# Patient Record
Sex: Male | Born: 1945
Health system: Southern US, Community
[De-identification: ages and names within clinical notes are randomized; demographics above are authoritative.]

## PROBLEM LIST (undated history)

## (undated) DIAGNOSIS — I10 Essential (primary) hypertension: Secondary | ICD-10-CM

## (undated) DIAGNOSIS — E7800 Pure hypercholesterolemia, unspecified: Secondary | ICD-10-CM

## (undated) DIAGNOSIS — R39198 Other difficulties with micturition: Secondary | ICD-10-CM

## (undated) DIAGNOSIS — M545 Low back pain, unspecified: Secondary | ICD-10-CM

## (undated) DIAGNOSIS — I350 Nonrheumatic aortic (valve) stenosis: Secondary | ICD-10-CM

## (undated) DIAGNOSIS — G8929 Other chronic pain: Secondary | ICD-10-CM

## (undated) DIAGNOSIS — Z952 Presence of prosthetic heart valve: Secondary | ICD-10-CM

## (undated) DIAGNOSIS — I639 Cerebral infarction, unspecified: Secondary | ICD-10-CM

## (undated) DIAGNOSIS — G4733 Obstructive sleep apnea (adult) (pediatric): Secondary | ICD-10-CM

## (undated) DIAGNOSIS — R197 Diarrhea, unspecified: Secondary | ICD-10-CM

## (undated) DIAGNOSIS — C4491 Basal cell carcinoma of skin, unspecified: Secondary | ICD-10-CM

## (undated) DIAGNOSIS — K6289 Other specified diseases of anus and rectum: Secondary | ICD-10-CM

## (undated) DIAGNOSIS — M1990 Unspecified osteoarthritis, unspecified site: Secondary | ICD-10-CM

## (undated) DIAGNOSIS — Z9989 Dependence on other enabling machines and devices: Secondary | ICD-10-CM

## (undated) DIAGNOSIS — R011 Cardiac murmur, unspecified: Secondary | ICD-10-CM

## (undated) DIAGNOSIS — K625 Hemorrhage of anus and rectum: Secondary | ICD-10-CM

## (undated) HISTORY — PX: EXCISIONAL HEMORRHOIDECTOMY: SHX1541

## (undated) HISTORY — DX: Hemorrhage of anus and rectum: K62.5

## (undated) HISTORY — PX: WISDOM TOOTH EXTRACTION: SHX21

## (undated) HISTORY — DX: Essential (primary) hypertension: I10

## (undated) HISTORY — PX: COLONOSCOPY: SHX174

## (undated) HISTORY — DX: Diarrhea, unspecified: R19.7

## (undated) HISTORY — DX: Other difficulties with micturition: R39.198

## (undated) HISTORY — DX: Other specified diseases of anus and rectum: K62.89

## (undated) HISTORY — PX: LUMBAR DISC SURGERY: SHX700

## (undated) HISTORY — DX: Nonrheumatic aortic (valve) stenosis: I35.0

## (undated) HISTORY — PX: BACK SURGERY: SHX140

---
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
## 2001-07-30 ENCOUNTER — Encounter: Admission: RE | Admit: 2001-07-30 | Discharge: 2001-07-30 | Payer: Self-pay | Admitting: Family Medicine

## 2001-07-30 ENCOUNTER — Encounter: Payer: Self-pay | Admitting: Family Medicine

## 2001-08-03 ENCOUNTER — Encounter (INDEPENDENT_AMBULATORY_CARE_PROVIDER_SITE_OTHER): Payer: Self-pay | Admitting: Specialist

## 2001-08-03 ENCOUNTER — Encounter: Payer: Self-pay | Admitting: Gastroenterology

## 2001-08-03 ENCOUNTER — Inpatient Hospital Stay (HOSPITAL_COMMUNITY): Admission: EM | Admit: 2001-08-03 | Discharge: 2001-08-11 | Payer: Self-pay | Admitting: Emergency Medicine

## 2001-08-03 ENCOUNTER — Ambulatory Visit (HOSPITAL_COMMUNITY): Admission: RE | Admit: 2001-08-03 | Discharge: 2001-08-03 | Payer: Self-pay | Admitting: Gastroenterology

## 2001-08-07 ENCOUNTER — Encounter: Payer: Self-pay | Admitting: Gastroenterology

## 2003-05-07 HISTORY — PX: LAPAROSCOPIC CHOLECYSTECTOMY: SUR755

## 2004-05-06 HISTORY — PX: MOHS SURGERY: SUR867

## 2004-08-30 ENCOUNTER — Encounter (INDEPENDENT_AMBULATORY_CARE_PROVIDER_SITE_OTHER): Payer: Self-pay | Admitting: Specialist

## 2004-08-30 ENCOUNTER — Ambulatory Visit (HOSPITAL_COMMUNITY): Admission: RE | Admit: 2004-08-30 | Discharge: 2004-08-30 | Payer: Self-pay | Admitting: Gastroenterology

## 2007-05-07 HISTORY — PX: ANTERIOR CERVICAL DECOMP/DISCECTOMY FUSION: SHX1161

## 2007-08-05 ENCOUNTER — Ambulatory Visit (HOSPITAL_COMMUNITY): Admission: RE | Admit: 2007-08-05 | Discharge: 2007-08-06 | Payer: Self-pay | Admitting: Neurological Surgery

## 2007-09-14 ENCOUNTER — Encounter: Admission: RE | Admit: 2007-09-14 | Discharge: 2007-09-14 | Payer: Self-pay | Admitting: Neurological Surgery

## 2007-11-30 ENCOUNTER — Encounter: Admission: RE | Admit: 2007-11-30 | Discharge: 2007-11-30 | Payer: Self-pay | Admitting: Neurological Surgery

## 2008-09-05 ENCOUNTER — Encounter: Admission: RE | Admit: 2008-09-05 | Discharge: 2008-09-05 | Payer: Self-pay | Admitting: Neurological Surgery

## 2009-05-23 ENCOUNTER — Encounter: Admission: RE | Admit: 2009-05-23 | Discharge: 2009-05-23 | Payer: Self-pay | Admitting: General Surgery

## 2009-05-26 ENCOUNTER — Ambulatory Visit (HOSPITAL_BASED_OUTPATIENT_CLINIC_OR_DEPARTMENT_OTHER): Admission: RE | Admit: 2009-05-26 | Discharge: 2009-05-26 | Payer: Self-pay | Admitting: General Surgery

## 2010-05-28 ENCOUNTER — Encounter: Payer: Self-pay | Admitting: Neurological Surgery

## 2010-07-22 LAB — BASIC METABOLIC PANEL
BUN: 10 mg/dL (ref 6–23)
Creatinine, Ser: 1.02 mg/dL (ref 0.4–1.5)
GFR calc non Af Amer: >60 mL/min (ref >60)
Glucose, Bld: 113 mg/dL — ABNORMAL HIGH (ref 70–99)
Potassium: 4.3 mEq/L (ref 3.5–5.1)

## 2010-07-22 LAB — DIFFERENTIAL
Basophils Absolute: 0 10*3/uL (ref 0.0–0.1)
Eosinophils Absolute: 0.1 10*3/uL (ref 0.0–0.7)
Eosinophils Relative: 1 % (ref 0–5)
Lymphocytes Relative: 36 % (ref 12–46)

## 2010-07-22 LAB — CBC
HCT: 45.9 % (ref 39.0–52.0)
Platelets: 210 10*3/uL (ref 150–400)
RDW: 13.3 % (ref 11.5–15.5)

## 2010-07-22 LAB — POCT HEMOGLOBIN-HEMACUE: Hemoglobin: 15.8 g/dL (ref 13.0–17.0)

## 2010-09-18 NOTE — Op Note (Signed)
NAMEGIULIO, [name]:  [id number]   MEDICAL RECORD NO.:  [id number]          PATIENT TYPE:  OIB   LOCATION:  3599                         FACILITY:  [facility]   PHYSICIAN:  Tia Alert, MD     DATE OF BIRTH:  09-11-1945   DATE OF PROCEDURE:  08/05/2007  DATE OF DISCHARGE:                               OPERATIVE REPORT   PREOPERATIVE DIAGNOSIS:  Cervical spondylosis with cervical spinal  stenosis C4-C5 and C5-C6.   POSTOPERATIVE DIAGNOSIS:  Cervical spondylosis with cervical spinal  stenosis C4-C5 and C5-C6.   PROCEDURE:  1. Decompressive anterior cervical discectomy at C4-C5 and C5-C6.  2. Anterior cervical arthrodesis C4-C5 and C5-C6 utilizing an 8 mm      corticocancellous allograft at C4-C5 and a 7 mm corticocancellous      allograft at C5-C6.  3. Anterior cervical plating C4 to C6 utilizing a 42 mm Atlantis      Venture plate.   SURGEON:  Tia Alert, M.D.   ASSISTANT:  Donalee Citrin, M.D.   ANESTHESIA:  General endotracheal anesthesia.   COMPLICATIONS:  None apparent.   INDICATIONS FOR PROCEDURE:  [name] is a 65 year old gentleman who is  referred with significant neck pain with arm pain with numbness in his  hands.  He had an MRI which showed significant spondylosis, cervical  spinal stenosis at C4-C5 and C5-C6 with cord compression at each level,  worse at C4-C5. I recommended a decompressive anterior cervical  discectomy, fusion, and plating at C4-C5 and C5-C6.  He understood the  risks, benefits, and expected outcome and wished to proceed.   DESCRIPTION OF PROCEDURE:  The patient was taken to the operating room.  After the induction of adequate generalized endotracheal anesthesia, he  was placed in a supine position on the operating room table and his  right anterior cervical region was prepped with DuraPrep and draped in  the usual sterile fashion.  5 mL local anesthesia was injected and a  small transverse incision was made to the right  of midline and carried  down to the platysma which was elevated, opened, and undermined with  Metzenbaum scissors.  I then dissected in a plane medial to the  sternocleidomastoid muscle and internal carotid artery and lateral to  the trachea and esophagus to expose C4-C5 and C5-C6.  Intraoperative x-  Attridge confirmed my level and then I took down the longus colli muscles and  placed the Shadowline retractors under this to expose C4-C5 and C5-C6.  The annulus was incised and the initial discectomy was done with  pituitary rongeurs and curved curettes.  The high speed drill was used  to drill the endplates and widen the disc space in a rectangular fashion  drilling down to the level of the posterior longitudinal ligament.  I  drilled to a height of 8 mm at C4-C5 which needed more central  decompression and 7 mm at C5-C6.  I then opened the posterior  longitudinal ligament at C4-C5 and removed it while undercutting the  bodies of C4 and C5 with the 2  and 1 mm Kerrison punch.  We angled the  scope up and down to undercut the C4-C5 vertebral bodies.  Bilateral  foraminotomies were performed.  We then palpated with a nerve hook in a  circumferential fashion to assure adequate decompression.  We could see  the cord pulsatile through the dura.  We then lined this with Gelfoam  and did the same exact decompression at C5-C6, opening the posterior  longitudinal ligament and removing it while undercutting the bodies of  C5 and C6, angling the scope up and down. Bilateral foraminotomies were  performed, the nerve roots were identified.  We dissected to the pedicle  wall, marched along the pedicle to decompress the nerve root.  We then  palpated in a circumferential fashion with the nerve hook to assure  adequate decompression.  We then measured our interspaces to be 8 mm at  C4-C5 and 7 mm at C5-C6 and got corticocancellous allograft and tapped  these into position at C4-C5 and C5-C6.  We then used a 42  mm Atlantis  Venture plate and placed two 13 mm variable angle screws in the bodies  of C4, C5, and C6 and these were locked in the plate by locking the  mechanism within the plate.  I then spent considerable time drying the  surgical bed.  I irrigated with saline solution containing bacitracin,  dried all bleeding points with bipolar cautery, and once meticulous  hemostasis was achieved, closed the platysma with 3-0 Vicryl, closed the  subcuticular tissue with 3-0 Vicryl, and closed the skin with Benzoin  and Steri-Strips.  The drapes were removed.  A sterile dressing was  applied.  The patient was awakened from general anesthesia and  transferred to the recovery room in stable condition.  At the end of the  procedure, all sponge, needle and instrument counts were correct.      Tia Alert, MD  Electronically Signed     DSJ/MEDQ  D:  08/05/2007  T:  08/05/2007  Job:  [id number]

## 2010-09-21 NOTE — Op Note (Signed)
[facility]  Patient:    [name], [name] Visit Number: [id number] MRN: [id number]          Service Type: END Location: ENDO Attending Physician:  Louie Bun Dictated by:   Zigmund Daniel, M.D. Proc. Date: 08/10/01 Admit Date:  08/03/2001 Discharge Date: 08/03/2001   CC:         Verlin Grills, M.D.  Dr. Madilyn Fireman   Operative Report  PREOPERATIVE DIAGNOSIS:  Acute and chronic cholecystitis and cholelithiasis.  POSTOPERATIVE DIAGNOSIS:  Acute and chronic cholecystitis and cholelithiasis.  OPERATION PERFORMED:  Laparoscopic cholecystectomy.  SURGEON:  Zigmund Daniel, M.D.  ASSISTANT:  Anselm Pancoast. Zachery Dakins, M.D.  ANESTHESIA:  DESCRIPTION OF PROCEDURE:  After the patient was monitored and anesthetized and had routine preparation and draping of the abdomen, I anesthetized the the area with long-acting local anesthetic just below the umbilicus.  I made a transverse incision, incising fascia longitudinally, placed a 0 Vicryl pursestring suture and secured a Hasson cannula.  After inflating the abdomen with CO2, I examined it and saw no abnormalities except some adhesions to the undersurface of the liver and gallbladder and impression that there was a bit of remaining edema in the retroperitoneum.  After placing three additional ports through anesthetized incisions under direct vision, I placed the patient head up, foot down and tilted to the left.  I then noted that the gallbladder was distended.  It appeared to be thick-walled and slightly inflamed, consistent with acute cholecystitis.  I took down a couple of adhesions so I could see the fundus well and then decompressed the gallbladder with suction aspirator and it contained clear bile which was consistent with acute cholecystitis.  I took down further adhesions and was able to grasp the fundus of the gallbladder and pull it laterally, then dissect it through the edematous and  slightly scarred hepatoduodenal ligament until I clearly identified the infundibulum of the gallbladder with the cystic duct emerging. I clipped the cystic duct with three clips distally and one toward the gallbladder and then cut.  I then identified the cystic artery and similarly clipped and divided it.  I then dissected the gallbladder from the liver using cautery.  I accidentally made a hole in it and one large stone came out and I retrieved that with a stone scoop.  After detaching the gallbladder from the liver, I put it in a plastic pouch and then copiously irrigated the right upper quadrant and cauterized the gallbladder fossa until I was sure hemostasis was good.  I then removed the gallbladder from the body through the umbilical incision inside the pouch and tied the pursestring suture.  I again copiously irrigated the right upper quadrant and removed irrigant.  Hemostasis was good and the clips appeared to be secure.  I allowed the CO2 to escape after removing the lateral ports under direct vision and then I removed the epigastric port and closed all skin incisions with intracuticular 4-0 Vicryl and Steri-Strips.  The patient tolerated the procedure well. Dictated by:   Zigmund Daniel, M.D. Attending Physician:  Louie Bun DD:  08/10/01 TD:  08/10/01 Job: [id number]

## 2010-09-21 NOTE — Op Note (Signed)
NAMEFAISAL, [name]                   ACCOUNT NO.:  [id number]   MEDICAL RECORD NO.:  [id number]          PATIENT TYPE:  AMB   LOCATION:  ENDO                         FACILITY:  [facility]   PHYSICIAN:  John C. Madilyn Fireman, M.D.    DATE OF BIRTH:  November 15, 1945   DATE OF PROCEDURE:  08/30/2004  DATE OF DISCHARGE:                                 OPERATIVE REPORT   PROCEDURE:  Colonoscopy.   INDICATION FOR PROCEDURE:  Family history of colon cancer in a first degree  relative, as well as chronic diarrhea.   DESCRIPTION OF PROCEDURE:  The patient was placed in the left lateral  decubitus position and placed on the pulse monitor with continuous low-flow  oxygen delivered by nasal cannula.  He was sedated with 25 mcg of IV  fentanyl and 3 mg of IV Versed in addition to medications given with the  previous EGD.  The Olympus video colonoscope was inserted into the rectum  and advanced to the cecum confirmed by transillumination of McBurney's point  and visualization of the ileocecal valve and appendiceal orifice.  The prep  was excellent.  The cecum, ascending, transverse, descending and sigmoid  colon all appeared normal with no masses, polyps, diverticula or other  mucosal abnormalities.  The rectum likewise appeared normal.  Retroflexed  view of the anus revealed no obvious internal hemorrhoids.  Biopsies were  taken to rule out microscopic colitis.  The scope was then withdrawn and the  patient returned to the recovery room in stable condition.  He tolerated the  procedure well and there were no immediate complications.   IMPRESSION:  Normal colonoscopy.   PLAN:  Will await biopsy results and repeat colonoscopy in five years based  on his family history.      JCH/MEDQ  D:  08/30/2004  T:  08/30/2004  Job:  [id number]   cc:   Lianne Bushy, M.D.  [address]  [place]  [place] [address]  Fax: [phone]

## 2010-09-21 NOTE — Discharge Summary (Signed)
[facility]  Patient:    [name], [name] Visit Number: [id number] MRN: [id number]          Service Type: MED Location: [phone] Attending Physician:  Louie Bun Dictated by:   Zigmund Daniel, M.D. Admit Date:  08/03/2001 Discharge Date: 08/11/2001   CC:         Verlin Grills, M.D.  John C. Madilyn Fireman, M.D.  Maricela Bo, M.D.   Discharge Summary  HISTORY OF PRESENT ILLNESS:  The patient is a 65 year old man who is known to have gallstones.  He had abnormal liver tests and, therefore, underwent endoscopic retrograde cholangiopancreatography sphincterotomy and common bile duct stone removal on the morning of admission.  He was previously in generally good health.  He developed upper abdominal pain with tenderness consistent with pancreatitis and was admitted by Dr. Laural Benes for care of that. See the history and physical for details.  HOSPITAL COURSE:  The patient was admitted and given IV fluids, pain medicine, antibiotics, and was stabilized.  Dr. Ezzard Standing saw him and advised that he would need laparoscopic cholecystectomy after resolution of his pancreatitis.  The patient made slow improvement.  It was thought that he would probably have to go home and come back later for cholecystectomy.  However, by August 09, 2001, he was pain-free, eating, and it was felt he was ready for surgery.  This operation took place on August 10, 2001, and he did undergo a laparoscopic cholecystectomy.  Postoperatively, he did very well and requested discharge the day after surgery.  He was sent home with instructions to see me again at the office in two to three weeks or as problems arose.  DIAGNOSES: 1. Cholelithiasis and choledocholithiasis. 2. Pancreatitis following endoscopic retrograde cholangiopancreatography with    sphincterotomy, improved.  OPERATIONS: 1. Endoscopic retrograde cholangiopancreatography with sphincterotomy. 2. Laparoscopic  cholecystectomy.  DISCHARGE CONDITION:  Improved and stable. Dictated by:   Zigmund Daniel, M.D. Attending Physician:  Louie Bun DD:  09/07/01 TD:  09/09/01 Job: [id number]

## 2010-09-21 NOTE — Procedures (Signed)
[facility]  Patient:    [name], [name] Visit Number: [id number] MRN: [id number]          Service Type: MED Location: [phone] Attending Physician:  Louie Bun Dictated by:   Everardo All Madilyn Fireman, M.D. Proc. Date: 08/03/01 Admit Date:  08/03/2001   CC:         Burnell Blanks, M.D.  Velora Heckler, M.D.   Procedure Report  PROCEDURE:  Endoscopic retrograde cholangiopancreatography with sphincterotomy and stone extraction.  INDICATION FOR PROCEDURE:  Common bile duct stone seen on abdominal CT scan in patient with known gallstones and elevated liver function test.  DESCRIPTION OF PROCEDURE:  The patient placed in the prone position and placed on the pulse monitor with continuous low flow oxygen delivered via nasal cannula. He was sedated with 100 mg IV Demerol and 10 mg IV Versed. The Olympus video side-viewing endoscope was advanced blindly into the oropharynx, esophagus, and stomach. The stomach appeared normal and the pylorus was traversed in the ampulla of Vater located on the medial duodenal wall. It was somewhat generous in size but otherwise had a normal appearance. The ampulla was cannulated with the Wilson-Cook sphincterotome but initial injections only are passed by the pancreatic duct. Eventually, I changed for a taper tip catheter and was able to selectively cannulate the common bile duct. The cholangiogram was obtained revealing a diffusely dilated common bile duct with a single distal filling defect approximately 4 to 5 mm in diameter. A large sphincterotomy was performed and 15 mm balloon catheter passed over the guidewire, inflated, and dragged down the common bile duct delivering a 4 to 5-mm single stone. Two additional sweeps were performed and no further stones delivered. The scope was then withdrawn and the patient returned to the recovery room in stable condition. He tolerated the procedure well and there were no immediate  complications.  IMPRESSION:  Common bile duct stones, status post sphincterotomy and stone extraction.  PLAN:  Will arrange elective cholecystectomy. Dictated by:   Everardo All Madilyn Fireman, M.D. Attending Physician:  Louie Bun DD:  08/03/01 TD:  08/03/01 Job: [id number]

## 2010-09-21 NOTE — H&P (Signed)
[facility]  Patient:    [name] Visit Number: [id number] MRN: [id number]          Service Type: MED Location: [phone] Attending Physician:  Louie Bun Dictated by:   Verlin Grills, M.D. Admit Date:  08/03/2001   CC:         John C. Madilyn Fireman, M.D.   History and Physical  REFERRED BY:  John C. Madilyn Fireman, M.D.  PROBLEM:  Post ERCP-endoscopic sphincterotomy, abdominal pain.  HISTORY OF PRESENT ILLNESS:  [name]. [name] is a 65 year old male born 12-01-1945.  [name] has symptomatic choledocholithiasis.  He underwent an endoscopic retrograde cholangiopancreatography with endoscopic sphincterotomy and common bile duct stone removal this morning.  At 10 a.m., August 03, 2001, his complete metabolic profile was normal except for a total bilirubin 5.0, alkaline phosphatase 168, SGOT 136, SGPT 339.  He received Ancef 1 g intravenously prior to undergoing the procedure.  Mr. Weinand left the endoscopy suite this morning in no pain, he ate a meal consisting of scrambled eggs and toast, he took a nap, and was awakened with 10/10 intensity upper abdominal pain radiating through to his back, associated with nausea and vomiting.  MEDICATION ALLERGIES:  None.  CHRONIC MEDICATIONS:  None.  PAST MEDICAL HISTORY:  Negative for chronic medical disease.  PAST SURGICAL HISTORY:  Back surgery and hemorrhoid surgery.  FAMILY HISTORY:  Noncontributory.  PHYSICAL EXAMINATION:  VITAL SIGNS:  Height 5 feet 10 inches, weight 215 pounds, blood pressure 129/79.  GENERAL APPEARANCE:  [name] is alert and oriented, lying on his stretcher in the [facility] Emergency Room.  HEENT:  Sclerae do not appear icteric.  Oropharynx normal.  LUNGS:  Clear to auscultation.  CARDIAC:  Regular rhythm without murmurs.  ABDOMEN:  Predominantly upper abdominal pain to palpation without palpable masses.  No signs of peritonitis.  EXTREMITIES:  No  edema.  ASSESSMENT:  Post endoscopic retrograde cholangiopancreatography-endoscopic sphincterotomy abdominal pain.  DIFFERENTIAL DIAGNOSES: 1. Post endoscopic retrograde cholangiopancreatography pancreatitis. 2. Duodenal preparation. 3. Acute cholecystitis. 4. Common bile duct obstruction (stone, blood clot, edema - spasm). Dictated by:   Verlin Grills, M.D. Attending Physician:  Louie Bun DD:  08/03/01 TD:  08/03/01 Job: [id number]

## 2010-09-21 NOTE — Consult Note (Signed)
[facility]  Patient:    [name], [name] Visit Number: [id number] MRN: [id number]          Service Type: MED Location: [phone] Attending Physician:  Louie Bun Dictated by:   Sandria Bales. Ezzard Standing, M.D. Proc. Date: 08/05/01 Admit Date:  08/03/2001   CC:         Maricela Bo, M.D.  John C. Madilyn Fireman, M.D.   Consultation Report  DATE OF BIRTH:  14-Nov-1945  REASON FOR CONSULTATION:  Gallstones/pancreatitis.  HISTORY OF PRESENT ILLNESS:  This is a 65 year old, white male who is a patient of Dr. Kathrynn Running in [place] who has had some vague abdominal back pain that may have gone on as long as last Summer.  He was recently noticed to be jaundice by Dr. Chrys Racer partner, Dr. Westly Pam, and they referred him to Dr. Dorena Cookey for further evaluation.  He was found to have cholelithiasis and probable common bile duct stones seen by CT scan.  He then underwent an ERCP on August 03, 2001, and had a single common bile duct stone retrieved with a sphincterotomy.  That evening, he represented to the [facility] Emergency Room and was admitted by Dr. Reece Agar and was noted to have worsening abdominal pain, a lipase of 2415 and amylase of 1349.  He was admitted with a diagnosis of post ERCP pancreatitis.  He was admitted on the evening of August 03, 2001. Since admission, he has gotten steadily better, although he still has soreness in his epigastrium and right upper quadrant.  His labs today show an amylase which has returned to 160, total bilirubin which was at highest 3.6, now down to 3.4 and Alk phos of 130.  His white blood count is up mildly to 16,000.  The patient denies any other GI history.  He denies ulcer disease, liver disease, pancreatic disease, colon disease or prior abdominal surgery.  ALLERGIES:  No known drug allergies.  MEDICATIONS:  Vitamins.  REVIEW OF SYSTEMS:  PULMONARY: He does not smoke cigarettes.  No history of pneumonia or  tuberculosis.  CARDIAC: No chest pain, hypertension or cardiac evaluation.  GASTROINTESTINAL: See history of present illness.  GENITOURINARY: No kidney stones, kidney infections.  PAST SURGICAL HISTORY: 1. Back surgery. 2. Hemorrhoid surgery.  SOCIAL HISTORY:  He works as a Contractor at [employer].  PHYSICAL EXAMINATION:  VITAL SIGNS:  Temperature 98.4, pulse 100, blood pressure 130/80.  GENERAL:  He is a well-nourished, pleasant, white male who may be mildly overweight.  HEENT:  Unremarkable.  NECK:  Supple without mass or thyromegaly.  LUNGS:  Clear to auscultation.  ABDOMEN:  Mildly distended with decreased bowel sounds.  He has tenderness in his epigastric and right upper quadrant, but no guarding, no rebound or peritoneal signs.  RECTAL:  I did not do a rectal exam.  EXTREMITIES:  Good strength in all four extremities.  NEUROLOGIC:  Grossly intact.  LABORATORY DATA AND X-Suliman FINDINGS:  Chest x-Nelon showed poor inspiratory effort with some elevation of his right hemidiaphragm, but no free air.  CT scan on March 27, with a dilated common bile duct with what was felt to be a possible filling defect.  He had no pancreatic mass or focal liver lesion. Abdominal ultrasound on March 27, showed gallstones.  White blood count 16,000, hemoglobin 14.9, hematocrit 43.  Sodium 135, potassium 3.9, chloride 102, CO2 29, glucose 114.  Bilirubin 3.3, Alk phos 130, SGOT 80, SGPT  22, albumin 2.9 and amylase 160.  IMPRESSION: 1. Post endoscopic retrograde cholangiopancreatography pancreatitis.  This    appears to be resolving well with the patient and agree with current    management of intravenous fluids, clear liquid diet and pain control. 2. Cholelithiasis.  I discussed with the patient the possibility of    laparoscopic cholecystectomy when pancreatitis resolved and this will    really just be a timing issue as when the patient is feeling better and    when  we can schedule his procedure.  I discussed both the indications of surgery, potential complications including bleeding, infection, bile duct injuries and possible open surgery.  We will follow the patient until his clinical course is stable enough that we can proceed with procedure. Dictated by:   Sandria Bales. Ezzard Standing, M.D. Attending Physician:  Louie Bun DD:  08/05/01 TD:  08/05/01 Job: [id number]

## 2010-09-21 NOTE — Op Note (Signed)
[name], [name]                   ACCOUNT NO.:  [id number]   MEDICAL RECORD NO.:  [id number]          PATIENT TYPE:  AMB   LOCATION:  ENDO                         FACILITY:  [facility]   PHYSICIAN:  [name], M.D.    DATE OF BIRTH:  1945-12-18   DATE OF PROCEDURE:  08/30/2004  DATE OF DISCHARGE:                                 OPERATIVE REPORT   PROCEDURE:  Esophagogastroduodenoscopy with biopsy.   INDICATIONS:  Abdominal pain, reflux and chronic diarrhea.  The patient is  also undergoing colonoscopy due to diarrhea and family history of colon  cancer in a first-degree relative.   DESCRIPTION OF PROCEDURE:  The patient was placed in the left lateral  decubitus position and placed on the pulse monitor with continuous low-flow  oxygen delivered by nasal cannula.  He was sedated with 75 mcg IV fentanyl  7 mg IV Versed.  The video endoscope was advanced under direct vision into  the oropharynx and esophagus.  The esophagus was straight and of normal  caliber, the squamocolumnar line at 38 cm.  There was no visible hiatal  hernia, ring, stricture, or other abnormalities at GE junction.  The stomach  was entered and a small amount of liquid secretions were suctioned from the  fundus.  Retroflexed view of the cardia was unremarkable.  The fundus and  body appeared normal.  From the antrum there was seen some erythema and  granularity consistent with mild gastritis.  A CLO-test was obtained.  There  were no ulcers or focal erosions.  The pylorus was not deformed and easily  allowed passage of the endoscope tip into the duodenum.  Both bulb and  second portion were  well inspected and appeared to be within normal limits.  Biopsies were taken of the distal duodenum to rule out celiac disease.  The  scope was then withdrawn and the patient prepared for colonoscopy.  He  tolerated the procedure well and there were no immediate complications.   IMPRESSION:  Antral gastritis; otherwise normal  study.   PLAN:  Will proceed with colonoscopy and await duodenal biopsies and CLO-  test.      JCH/MEDQ  D:  08/30/2004  T:  08/30/2004  Job:  [id number]   cc:   [name], M.D.  [address]  [place]  [place] [address]  Fax: [phone]

## 2011-01-28 LAB — PROTIME-INR
INR: 0.9
Prothrombin Time: 12.5

## 2011-01-28 LAB — DIFFERENTIAL
Lymphocytes Relative: 33
Lymphs Abs: 2.9
Neutrophils Relative %: 55

## 2011-01-28 LAB — BASIC METABOLIC PANEL
BUN: 8
Creatinine, Ser: 0.95
GFR calc non Af Amer: >60
Potassium: 4.5

## 2011-01-28 LAB — CBC
Platelets: 251
WBC: 8.9

## 2011-05-27 ENCOUNTER — Ambulatory Visit (INDEPENDENT_AMBULATORY_CARE_PROVIDER_SITE_OTHER): Payer: BC Managed Care – PPO | Admitting: General Surgery

## 2011-05-27 ENCOUNTER — Encounter (INDEPENDENT_AMBULATORY_CARE_PROVIDER_SITE_OTHER): Payer: Self-pay | Admitting: General Surgery

## 2011-05-27 ENCOUNTER — Telehealth (INDEPENDENT_AMBULATORY_CARE_PROVIDER_SITE_OTHER): Payer: Self-pay | Admitting: General Surgery

## 2011-05-27 VITALS — BP 164/92 | HR 88 | Temp 98.6°F | Ht 70.0 in | Wt 242.4 lb

## 2011-05-27 DIAGNOSIS — K6289 Other specified diseases of anus and rectum: Secondary | ICD-10-CM | POA: Insufficient documentation

## 2011-05-27 MED ORDER — HYDROCORTISONE ACETATE 25 MG RE SUPP: 25.0000 mg | Freq: Two times a day (BID) | RECTAL | Status: AC

## 2011-05-27 NOTE — Progress Notes (Signed)
HPI The patient comes in with symptoms last several weeks however when questioned he states that he never really recovered as he thought he showed from the surgery that was done back in February of 2011. Over the past several weeks she's had increased pain particularly on the right side along with bleeding on the bathroom tissue  PE On digital examination I can palpate what feels to be a tightness in the right posterior lateral portion of the anal verge. On anoscopic examination a large scope was used to know some tightness entering into the anal canal. He had some mild hemorrhoidal disease at approximately the 1:00 position with 12:00 being directly posterior harbor the bleeding that he developed after the examination came from the strictured area where there was some mild tearing.  I did not see any thrombosed internal or external hemorrhoids.  Studiy review Currently there are no studies to review.  Assessment Mild hemorrhoidal disease with possible in a rectal stricture. The stricture was stretched out somewhat today on but anoscopic examination. I will place the patient on some new medication  Plan Anusol-HC suppositories twice a day for the next 2 weeks and I will reexamine him in 3 weeks for improvement. I am reluctant to perform any further surgery as the patient does have some mild stricturing of the anal rectal area hopefully with suppositories he would have a significant improvement in his symptoms.

## 2011-06-18 ENCOUNTER — Encounter (INDEPENDENT_AMBULATORY_CARE_PROVIDER_SITE_OTHER): Payer: BC Managed Care – PPO | Admitting: General Surgery

## 2013-12-02 ENCOUNTER — Ambulatory Visit (INDEPENDENT_AMBULATORY_CARE_PROVIDER_SITE_OTHER): Payer: Medicare Other | Admitting: Podiatrist

## 2013-12-02 ENCOUNTER — Encounter: Payer: Self-pay | Admitting: Podiatrist

## 2013-12-02 VITALS — BP 157/96 | HR 75 | Resp 16 | Ht 70.0 in | Wt 250.0 lb

## 2013-12-02 DIAGNOSIS — L600 Ingrowing nail: Secondary | ICD-10-CM

## 2013-12-02 NOTE — Patient Instructions (Signed)

## 2013-12-02 NOTE — Progress Notes (Signed)
   Subjective:    Patient ID: [name], male    DOB: 10/19/1945, 68 y.o.   MRN: [id number]  HPI Comments: i have an ingrown toenail on my rt big toe. i just noticed it on Tuesday. Its remained the same. It hurts to walk. i havent done anything for it.     Review of Systems  Genitourinary: Positive for urgency and frequency.  Musculoskeletal:       Joint pain Back pain   All other systems reviewed and are negative.      Objective:   Physical Exam Patient is awake, alert, and oriented x 3.  In no acute distress.  Vascular status is intact with palpable pedal pulses at 2/4 DP and PT bilateral and capillary refill time within normal limits. Neurological sensation is also intact bilaterally via Semmes Weinstein monofilament at 5/5 sites. Light touch, vibratory sensation, Achilles tendon reflex is intact. Dermatological exam reveals skin color, turger and texture as normal. No open lesions present.  Musculature intact with dorsiflexion, plantarflexion, inversion, eversion.  Patient's right great toenail is ingrown on the medial and lateral border with the lateral nail border being more symptomatic. Slight redness and tenderness on the lateral border is also present. No streaking, no lymphangitis, no pus, pirulence is seen.     Assessment & Plan:  Ingrown right hallux nail medial and lateral nail border  Plan: Discussed permanent phenol matrixectomy. I have worked on this toe in the past and this may be a repeat procedure. He would however like to proceed. Right great toe was prepped with alcohol and a 1 to 1 mix of 0.5% marcaine plain and 2% lidocaine plain was administered in a digital block fashion.  The toe was then prepped with betadine solution and exsanguinated.  The offending medial and lateral nail border was then excised and matrix tissue exposed.  Phenol was then applied to the matrix tissue followed by an alcohol wash.  Antibiotic ointment and a dry sterile dressing was applied.   The patient was dispensed instructions for aftercare.  I did discuss if it comes back we can consider surgical excision versus a different chemical. He will call if any problems arise.

## 2014-09-07 ENCOUNTER — Ambulatory Visit (INDEPENDENT_AMBULATORY_CARE_PROVIDER_SITE_OTHER): Payer: Medicare Other | Admitting: Podiatrist

## 2014-09-07 ENCOUNTER — Encounter: Payer: Self-pay | Admitting: Podiatrist

## 2014-09-07 VITALS — BP 148/89 | HR 79 | Resp 14

## 2014-09-07 DIAGNOSIS — L600 Ingrowing nail: Secondary | ICD-10-CM | POA: Diagnosis not present

## 2014-09-07 MED ORDER — AMOXICILLIN-POT CLAVULANATE 875-125 MG PO TABS: 1.0000 | ORAL_TABLET | Freq: Two times a day (BID) | ORAL | Status: DC

## 2014-09-07 NOTE — Progress Notes (Signed)
   Subjective:    Patient ID: [name], male    DOB: May 01, 1946, 69 y.o.   MRN: [id number]  HPI Comments: i have an ingrown toenail on my rt big toe. i just noticed it on Tuesday. Its remained the same. It hurts to walk. i havent done anything for it.     Review of Systems  Genitourinary: Positive for urgency and frequency.  Musculoskeletal:       Joint pain Back pain   All other systems reviewed and are negative.      Objective:   Physical Exam Patient is awake, alert, and oriented x 3.  In no acute distress.  Vascular status is intact with palpable pedal pulses at 2/4 DP and PT bilateral and capillary refill time within normal limits. Neurological sensation is also intact bilaterally via Semmes Weinstein monofilament at 5/5 sites. Light touch, vibratory sensation, Achilles tendon reflex is intact. Dermatological exam reveals skin color, turger and texture as normal. No open lesions present.  Musculature intact with dorsiflexion, plantarflexion, inversion, eversion.  Patient's right great toenail is ingrown on the medial side.;  Redness and tenderness is present with swelling seen.  No streaking, no lymphangitis, no pus, pirulence is seen.     Assessment & Plan:  Ingrown right hallux nail medial nail border  Plan: Discussed permanent phenol matrixectomy. I have worked on this toe in the past and this may be a repeat procedure. He would however like to proceed. Right great toe was prepped with alcohol and a 1 to 1 mix of 0.5% marcaine plain and 2% lidocaine plain was administered in a digital block fashion.  The toe was then prepped with betadine solution and exsanguinated.  The offending medial nail border was then excised and matrix tissue exposed.  Phenol was then applied to the matrix tissue followed by an alcohol wash.  Antibiotic ointment and a dry sterile dressing was applied.  The patient was dispensed instructions for aftercare.  This is the third time this particular side has  been worked on- if it returns in the future, may be a candidate for surgical excision. augmentin prescribed for 7 days as well.

## 2014-09-07 NOTE — Patient Instructions (Addendum)
Betadine Soak Instructions  Purchase an 8 oz. bottle of BETADINE solution (Povidone)  THE DAY AFTER THE PROCEDURE  Place 1 tablespoon of betadine solution in a quart of warm tap water.  Submerge your foot or feet with outer bandage intact for the initial soak; this will allow the bandage to become moist and wet for easy lift off.  Once you remove your bandage, continue to soak in the solution for 20 minutes.  This soak should be done twice a day.  Next, remove your foot or feet from solution, blot dry the affected area and cover.  You may use a band aid large enough to cover the area or use gauze and tape.  Apply other medications to the area as directed by the doctor such as cortisporin otic solution (ear drops) or neosporin.  IF YOUR SKIN BECOMES IRRITATED WHILE USING THESE INSTRUCTIONS, OR IF YOU PREFER, FEEL FREE TO SWITCH TO ANTIBACTERIAL SOAP INSTRUCTIONS BELOW  THE DAY AFTER PROCEDURE   For the first dressing change (soak) Place 3-4 drops of antibacterial liquid soap in a quart of warm tap water.  Submerge foot into water for 20 minutes.  If bandage was applied after your procedure, leave on to allow for easy lift off, then remove and continue with soak for the remaining time.  Next, blot area dry with a soft cloth and apply antibiotic ointment such as polysporin, neosporin, or triple antibiotic ointment.  You may then switch to the instructions below    Shower as usual. Before getting out, place a drop of antibacterial liquid soap (Dial) on a wet, clean washcloth.  Gently wipe washcloth over affected area.  Afterward, rinse the area with warm water.  Blot the area dry with a soft cloth and cover with antibiotic ointment (neosporin, polysporin, bacitracin) and band aid or gauze and tape    Long Term Care Instructions-Post Nail Surgery  You have had your ingrown toenail and root treated with a chemical.  This chemical causes a burn that will drain and ooze like a blister.  1-2 weeks  after the procedure you may leave the area open to air at night to help dry it up.  During the day,  It is important to keep this area clean and covered until the toe dries out and forms a scab. Once the scab forms you no longer need to soak or apply a dressing.  If at any time you experience an increase in pain, redness, swelling, or drainage, you should contact the office as soon as possible.

## 2017-10-31 ENCOUNTER — Other Ambulatory Visit (HOSPITAL_COMMUNITY): Payer: Self-pay | Admitting: Physician Assistant

## 2017-10-31 DIAGNOSIS — R011 Cardiac murmur, unspecified: Secondary | ICD-10-CM

## 2017-11-20 ENCOUNTER — Other Ambulatory Visit (HOSPITAL_COMMUNITY): Payer: Self-pay

## 2017-11-21 ENCOUNTER — Other Ambulatory Visit: Payer: Self-pay

## 2017-11-21 ENCOUNTER — Ambulatory Visit (HOSPITAL_COMMUNITY): Payer: Medicare Other | Attending: Cardiology

## 2017-11-21 DIAGNOSIS — Z6835 Body mass index (BMI) 35.0-35.9, adult: Secondary | ICD-10-CM | POA: Insufficient documentation

## 2017-11-21 DIAGNOSIS — I517 Cardiomegaly: Secondary | ICD-10-CM | POA: Insufficient documentation

## 2017-11-21 DIAGNOSIS — R011 Cardiac murmur, unspecified: Secondary | ICD-10-CM | POA: Insufficient documentation

## 2017-11-21 DIAGNOSIS — I77810 Thoracic aortic ectasia: Secondary | ICD-10-CM | POA: Insufficient documentation

## 2017-11-21 DIAGNOSIS — I350 Nonrheumatic aortic (valve) stenosis: Secondary | ICD-10-CM | POA: Diagnosis not present

## 2017-11-21 DIAGNOSIS — E669 Obesity, unspecified: Secondary | ICD-10-CM | POA: Diagnosis not present

## 2018-01-29 ENCOUNTER — Other Ambulatory Visit: Payer: Self-pay

## 2018-01-29 ENCOUNTER — Emergency Department (HOSPITAL_COMMUNITY): Payer: Medicare Other

## 2018-01-29 ENCOUNTER — Encounter (HOSPITAL_COMMUNITY): Payer: Self-pay | Admitting: Pharmacy Technician

## 2018-01-29 ENCOUNTER — Observation Stay (HOSPITAL_COMMUNITY): Payer: Medicare Other

## 2018-01-29 ENCOUNTER — Observation Stay (HOSPITAL_BASED_OUTPATIENT_CLINIC_OR_DEPARTMENT_OTHER)
Admission: EM | Admit: 2018-01-29 | Discharge: 2018-01-30 | Disposition: A | Discharge status: Home / Self Care | Payer: Medicare Other | Source: Home / Self Care | Attending: Emergency Medicine | Admitting: Emergency Medicine

## 2018-01-29 DIAGNOSIS — D496 Neoplasm of unspecified behavior of brain: Secondary | ICD-10-CM | POA: Diagnosis present

## 2018-01-29 DIAGNOSIS — R1310 Dysphagia, unspecified: Secondary | ICD-10-CM | POA: Insufficient documentation

## 2018-01-29 DIAGNOSIS — I10 Essential (primary) hypertension: Secondary | ICD-10-CM | POA: Diagnosis present

## 2018-01-29 DIAGNOSIS — E785 Hyperlipidemia, unspecified: Secondary | ICD-10-CM | POA: Insufficient documentation

## 2018-01-29 DIAGNOSIS — I6300 Cerebral infarction due to thrombosis of unspecified precerebral artery: Secondary | ICD-10-CM

## 2018-01-29 DIAGNOSIS — I63412 Cerebral infarction due to embolism of left middle cerebral artery: Secondary | ICD-10-CM

## 2018-01-29 DIAGNOSIS — N400 Enlarged prostate without lower urinary tract symptoms: Secondary | ICD-10-CM | POA: Insufficient documentation

## 2018-01-29 DIAGNOSIS — R4701 Aphasia: Secondary | ICD-10-CM

## 2018-01-29 DIAGNOSIS — I639 Cerebral infarction, unspecified: Secondary | ICD-10-CM | POA: Diagnosis not present

## 2018-01-29 DIAGNOSIS — Z66 Do not resuscitate: Secondary | ICD-10-CM | POA: Insufficient documentation

## 2018-01-29 DIAGNOSIS — R2990 Unspecified symptoms and signs involving the nervous system: Secondary | ICD-10-CM

## 2018-01-29 HISTORY — DX: Cerebral infarction, unspecified: I63.9

## 2018-01-29 LAB — DIFFERENTIAL
ABS IMMATURE GRANULOCYTES: 0.1 10*3/uL (ref 0.0–0.1)
BASOS ABS: 0.1 10*3/uL (ref 0.0–0.1)
BASOS PCT: 1 %
EOS ABS: 0.1 10*3/uL (ref 0.0–0.7)
Eosinophils Relative: 1 %
Immature Granulocytes: 1 %
Lymphocytes Relative: 23 %
Lymphs Abs: 2.2 10*3/uL (ref 0.7–4.0)
Monocytes Absolute: 0.8 10*3/uL (ref 0.1–1.0)
Monocytes Relative: 9 %
NEUTROS ABS: 6.1 10*3/uL (ref 1.7–7.7)
NEUTROS PCT: 65 %

## 2018-01-29 LAB — CBC
HCT: 53.5 % — ABNORMAL HIGH (ref 39.0–52.0)
HEMOGLOBIN: 17 g/dL (ref 13.0–17.0)
MCH: 30.9 pg (ref 26.0–34.0)
MCHC: 31.8 g/dL (ref 30.0–36.0)
MCV: 97.3 fL (ref 78.0–100.0)
PLATELETS: 201 10*3/uL (ref 150–400)
RBC: 5.5 MIL/uL (ref 4.22–5.81)
RDW: 12.6 % (ref 11.5–15.5)
WBC: 9.4 10*3/uL (ref 4.0–10.5)

## 2018-01-29 LAB — COMPREHENSIVE METABOLIC PANEL
ALT: 15 U/L (ref 0–44)
AST: 23 U/L (ref 15–41)
Albumin: 3.7 g/dL (ref 3.5–5.0)
Alkaline Phosphatase: 63 U/L (ref 38–126)
Anion gap: 12 (ref 5–15)
BUN: 9 mg/dL (ref 8–23)
CHLORIDE: 99 mmol/L (ref 98–111)
CO2: 29 mmol/L (ref 22–32)
CREATININE: 1.11 mg/dL (ref 0.61–1.24)
Calcium: 9.6 mg/dL (ref 8.9–10.3)
GFR calc Af Amer: >60 mL/min (ref >60)
GFR calc non Af Amer: >60 mL/min (ref >60)
Glucose, Bld: 160 mg/dL — ABNORMAL HIGH (ref 70–99)
POTASSIUM: 4.6 mmol/L (ref 3.5–5.1)
SODIUM: 140 mmol/L (ref 135–145)
Total Bilirubin: 1.1 mg/dL (ref 0.3–1.2)
Total Protein: 7 g/dL (ref 6.5–8.1)

## 2018-01-29 LAB — RAPID URINE DRUG SCREEN, HOSP PERFORMED
AMPHETAMINES: NOT DETECTED
BENZODIAZEPINES: NOT DETECTED
Barbiturates: NOT DETECTED
COCAINE: NOT DETECTED
Opiates: NOT DETECTED
Tetrahydrocannabinol: NOT DETECTED

## 2018-01-29 LAB — APTT: APTT: 34 s (ref 24–36)

## 2018-01-29 LAB — URINALYSIS, ROUTINE W REFLEX MICROSCOPIC
Bilirubin Urine: NEGATIVE
GLUCOSE, UA: NEGATIVE mg/dL
HGB URINE DIPSTICK: NEGATIVE
Ketones, ur: NEGATIVE mg/dL
Leukocytes, UA: NEGATIVE
Nitrite: NEGATIVE
PH: 8 (ref 5.0–8.0)
Protein, ur: NEGATIVE mg/dL
Specific Gravity, Urine: 1.035 — ABNORMAL HIGH (ref 1.005–1.030)

## 2018-01-29 LAB — I-STAT CHEM 8, ED
BUN: 11 mg/dL (ref 8–23)
CALCIUM ION: 1.19 mmol/L (ref 1.15–1.40)
CREATININE: 1.1 mg/dL (ref 0.61–1.24)
Chloride: 99 mmol/L (ref 98–111)
Glucose, Bld: 159 mg/dL — ABNORMAL HIGH (ref 70–99)
HCT: 53 % — ABNORMAL HIGH (ref 39.0–52.0)
Hemoglobin: 18 g/dL — ABNORMAL HIGH (ref 13.0–17.0)
Potassium: 4.3 mmol/L (ref 3.5–5.1)
SODIUM: 138 mmol/L (ref 135–145)
TCO2: 31 mmol/L (ref 22–32)

## 2018-01-29 LAB — TSH: TSH: 0.503 u[IU]/mL (ref 0.350–4.500)

## 2018-01-29 LAB — I-STAT TROPONIN, ED: Troponin i, poc: 0 ng/mL (ref 0.00–0.08)

## 2018-01-29 LAB — PROTIME-INR
INR: 1.03
PROTHROMBIN TIME: 13.4 s (ref 11.4–15.2)

## 2018-01-29 LAB — ETHANOL: Alcohol, Ethyl (B): <10 mg/dL (ref <10)

## 2018-01-29 LAB — CBG MONITORING, ED: GLUCOSE-CAPILLARY: 148 mg/dL — AB (ref 70–99)

## 2018-01-29 MED ORDER — ENOXAPARIN SODIUM 40 MG/0.4ML ~~LOC~~ SOLN
40.0000 mg | SUBCUTANEOUS | Status: DC
  Administered 2018-01-29: 40 mg via SUBCUTANEOUS
  Filled 2018-01-29: qty 0.4

## 2018-01-29 MED ORDER — TERBINAFINE HCL 250 MG PO TABS
250.0000 mg | ORAL_TABLET | Freq: Every day | ORAL | Status: DC
  Administered 2018-01-29 – 2018-01-30 (×2): 250 mg via ORAL
  Filled 2018-01-29 (×3): qty 1

## 2018-01-29 MED ORDER — SENNOSIDES-DOCUSATE SODIUM 8.6-50 MG PO TABS: 1.0000 | ORAL_TABLET | Freq: Every evening | ORAL | Status: DC | PRN

## 2018-01-29 MED ORDER — ASPIRIN EC 81 MG PO TBEC
81.0000 mg | DELAYED_RELEASE_TABLET | Freq: Once | ORAL | Status: AC
  Administered 2018-01-29: 81 mg via ORAL
  Filled 2018-01-29: qty 1

## 2018-01-29 MED ORDER — HYDROCODONE-ACETAMINOPHEN 5-325 MG PO TABS: 1.0000 | ORAL_TABLET | Freq: Every day | ORAL | Status: DC | PRN

## 2018-01-29 MED ORDER — ATORVASTATIN CALCIUM 80 MG PO TABS: 80.0000 mg | ORAL_TABLET | Freq: Every day | ORAL | Status: DC

## 2018-01-29 MED ORDER — DUTASTERIDE 0.5 MG PO CAPS
0.5000 mg | ORAL_CAPSULE | Freq: Every day | ORAL | Status: DC
  Administered 2018-01-30: 0.5 mg via ORAL
  Filled 2018-01-29: qty 1

## 2018-01-29 MED ORDER — STROKE: EARLY STAGES OF RECOVERY BOOK
Freq: Once | Status: DC
  Filled 2018-01-29 (×2): qty 1

## 2018-01-29 MED ORDER — LORAZEPAM 2 MG/ML IJ SOLN
0.5000 mg | Freq: Once | INTRAMUSCULAR | Status: AC
  Administered 2018-01-29: 0.5 mg via INTRAVENOUS
  Filled 2018-01-29: qty 1

## 2018-01-29 MED ORDER — GADOBUTROL 1 MMOL/ML IV SOLN
10.0000 mL | Freq: Once | INTRAVENOUS | Status: AC | PRN
  Administered 2018-01-29: 10 mL via INTRAVENOUS

## 2018-01-29 MED ORDER — IOPAMIDOL (ISOVUE-370) INJECTION 76%
50.0000 mL | Freq: Once | INTRAVENOUS | Status: AC
  Administered 2018-01-29: 50 mL via INTRAVENOUS

## 2018-01-29 MED ORDER — SODIUM CHLORIDE 0.9 % IV SOLN
INTRAVENOUS | Status: DC
  Administered 2018-01-29: 20:00:00 via INTRAVENOUS

## 2018-01-29 MED ORDER — ACETAMINOPHEN 325 MG PO TABS: 650.0000 mg | ORAL_TABLET | ORAL | Status: DC | PRN

## 2018-01-29 MED ORDER — FINASTERIDE 5 MG PO TABS
5.0000 mg | ORAL_TABLET | Freq: Every day | ORAL | Status: DC
  Administered 2018-01-30: 5 mg via ORAL
  Filled 2018-01-29: qty 1

## 2018-01-29 MED ORDER — ACETAMINOPHEN 650 MG RE SUPP: 650.0000 mg | RECTAL | Status: DC | PRN

## 2018-01-29 MED ORDER — ACETAMINOPHEN 160 MG/5ML PO SOLN: 650.0000 mg | ORAL | Status: DC | PRN

## 2018-01-29 NOTE — Consult Note (Addendum)
Neurology Consultation  Reason for Consult: Expressive aphasia possible stroke Referring Physician: Billy Fischer, E  CC: Expressive aphasia  History is obtained from: Patient and wife  HPI: [name] is a 72 y.o. male with stroke risk factors of hypertension.  Patient states that yesterday at approximately 3:00 he noted that he was having difficulty expressing himself.  Wife states that she noted this also.  He does state that he had some blurred vision however not significant and he does wear glasses.  He denies any numbness, tingling, understanding people, or weakness.  He does state that he will have intermittent dizziness that at times occurs when he moves his head but not always.  His wife states that he has had intermittent headaches but he denies any scotoma, flashing lights, or wavy lines.  He denies any history of migraines.  He does not take an aspirin or smoke.  Wife states that he is not a healthy eater- what she means by this is that he eats 1 meal a day however does not feel that he he eats a high fatty meal or high cholesterol meal.  Wife notes he would often have difficulty finding words.   LKW: 1500 hrs. on 01/28/2018 tpa given?: no, out of window with NIH stroke scale 0 Premorbid modified Rankin scale (mRS): 0 NIH stroke scale of 0  ROS: A 14 point ROS was performed and is negative except as noted in the HPI.   Past Medical History:  Diagnosis Date  . Diarrhea   . Difficulty urinating   . Hypertension   . Rectal bleeding   . Rectal pain     Family History  Problem Relation Age of Onset  . Cancer Mother        breast/colon  . Heart disease Father      Social History:   reports that he has never smoked. He has never used smokeless tobacco. He reports that he drinks alcohol. He reports that he does not use drugs.  Medications No current facility-administered medications for this encounter.   Current Outpatient Medications:  .  amoxicillin-clavulanate  (AUGMENTIN) 875-125 MG per tablet, Take 1 tablet by mouth 2 (two) times daily., Disp: 20 tablet, Rfl: 0 .  doxazosin (CARDURA) 4 MG tablet, , Disp: , Rfl:  .  finasteride (PROSCAR) 5 MG tablet, , Disp: , Rfl:  .  naproxen sodium (ANAPROX) 220 MG tablet, Take 220 mg by mouth as needed., Disp: , Rfl:    Exam: Current vital signs: BP (!) 150/79   Pulse 82   Resp 20   SpO2 92%  Vital signs in last 24 hours: Pulse Rate:  [72-82] 82 (09/26 1245) Resp:  [20-24] 20 (09/26 1245) BP: (150-167)/(79-97) 150/79 (09/26 1245) SpO2:  [92 %-98 %] 92 % (09/26 1245)  GENERAL: Awake, alert in NAD Ext: warm, well perfused, intact peripheral pulses,  NEURO:  Mental Status: AA&Ox3, speech is clear.  Naming, repetition and comprehension intact.  No noted expressive aphasia or receptive aphasia. He does have some word finding difficulty but grammar and syntax are correct after he pauses to give an answer. Some difficulty with abstract thinking, but able to count and do math well. Named 7 farm animals in 1 minute. Spelled WORLD backwards well. Able to name months of the year backwards, but somewhat slowly and with many pauses. Able to name 3/3 items on immediate recall and 2/3 memory items after a delay - also unable to recall the 3rd item after a cue.  Cranial Nerves: Pupils--right 4mm and left 2mm with sluggish reaction--right pupil has lens opacity. EOMI with saccadic visual pursuits, visual fields full, no facial asymmetry, facial sensation intact, hearing intact to voice, tongue midline Motor: 5/5 throughout. Tone and bulk are normal Sensation- Intact to light touch bilaterally Coordination: FTN intact bilaterally, no ataxia in BLE. Reflexes: Hypoactive without asymmetry Gait- deferred   Labs I have reviewed labs in epic and the results pertinent to this consultation are:   CBC    Component Value Date/Time   WBC 9.4 01/29/2018 1220   RBC 5.50 01/29/2018 1220   HGB 18.0 (H) 01/29/2018 1230   HCT  53.0 (H) 01/29/2018 1230   PLT 201 01/29/2018 1220   MCV 97.3 01/29/2018 1220   MCH 30.9 01/29/2018 1220   MCHC 31.8 01/29/2018 1220   RDW 12.6 01/29/2018 1220   LYMPHSABS 2.2 01/29/2018 1220   MONOABS 0.8 01/29/2018 1220   EOSABS 0.1 01/29/2018 1220   BASOSABS 0.1 01/29/2018 1220    CMP     Component Value Date/Time   NA 138 01/29/2018 1230   K 4.3 01/29/2018 1230   CL 99 01/29/2018 1230   CO2 30 05/23/2009 1325   GLUCOSE 159 (H) 01/29/2018 1230   BUN 11 01/29/2018 1230   CREATININE 1.10 01/29/2018 1230   CALCIUM 8.8 05/23/2009 1325   GFRNONAA >60 05/23/2009 1325   GFRAA  05/23/2009 1325    >60        The eGFR has been calculated using the MDRD equation. This calculation has not been validated in all clinical situations. eGFR's persistently <60 mL/min signify possible Chronic Kidney Disease.    Lipid Panel  No results found for: CHOL, TRIG, HDL, CHOLHDL, VLDL, LDLCALC, LDLDIRECT   Imaging I have reviewed the images obtained:  CTA of head and neck: IMPRESSION: No intracranial or extracranial stenosis or occlusion of Significance. Chronic RIGHT basal ganglia infarct.  Equivocal acute to subacute infarct, LEFT frontal operculum area of Hypoattenuation. 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   MRI examination of the brain--pending    David Smith PA-C [facility] [phone]  M-F  (9:00 am- 5:00 PM)  01/29/2018, 2:00 PM     Assessment: 72-year-old male presenting to the hospital with new onset of expressive language difficulties and intermittent dizziness.  At this time given his age and risk factor of hypertension along with acute to subacute left frontal area of hypoattenuation on CT I believe this patient may have suffered a small stroke versus TIA. Patient would benefit from a stroke work-up.  Recommendations: # MRI of the brain without contrast  # MRA head and neck if MRI brain is positive for  stroke #Transthoracic Echo,  # Start patient on ASA 325mg daily  #Start or continue Atorvastatin 80 mg # BP goal: permissive HTN upto 220/120 mmHg # HBAIC and Lipid profile # Telemetry monitoring # Frequent neuro checks # NPO until passes stroke swallow screen # please page stroke NP  Or  PA  Or MD from 8am -4 pm  as this patient from this time will be  followed by the stroke.   You can look them up on [email or web address]  Password [email or web address]  I have seen and examined the patient. I have amended the examination, assessment and recommendations above. Electronically signed: Dr. Eric Lindzen    

## 2018-01-29 NOTE — ED Notes (Signed)
Patient transported to MRI 

## 2018-01-29 NOTE — ED Notes (Signed)
This RN accompanied pt to CT 

## 2018-01-29 NOTE — ED Triage Notes (Signed)
Pt bib [facility] with reports of expressive aphasia and intermittent frontal headache. LKW 1400 yesterday. Pt with no other neurological deficits per EMS. 152/84, HR 86 NSR, 91% RA, 98% 2L [place], CNB 156.

## 2018-01-29 NOTE — Plan of Care (Signed)
Will cont to mon 

## 2018-01-29 NOTE — ED Provider Notes (Signed)
[facility] EMERGENCY DEPARTMENT Provider Note   CSN: [id number] Arrival date & time:        History   Chief Complaint Chief Complaint  Patient presents with  . Stroke Symptoms    HPI [name] is a 72 y.o. male.  HPI   Patient is a full 72 year old male with history of hypertension, who presents emergency department today for evaluation of expressive aphasia that began yesterday at 1400.  Patient states that patient has been intermittent.  It has been associated with intermittent frontal headaches.  He also reports he has had some intermittent dizziness/lightheadedness.  Initially he reported that he did not have any weakness or numbness however on exam he had some subjective weakness to the left upper extremity just with finger-to-nose testing.  States he has had intermittent visual changes over the last day as well stating that he has blurred vision to the bilateral eyes, however he denies any persistent blurred vision.  Denies any recent falls or trauma.  No chest pain shortness of breath or other symptoms.  Past Medical History:  Diagnosis Date  . Diarrhea   . Difficulty urinating   . Hypertension   . Rectal bleeding   . Rectal pain     Patient Active Problem List   Diagnosis Date Noted  . CVA (cerebral vascular accident) ([place]) 01/29/2018  . Brain tumor ([place]) 01/29/2018  . Essential hypertension 01/29/2018  . BPH (benign prostatic hyperplasia) 01/29/2018  . Anal or rectal pain 05/27/2011    Past Surgical History:  Procedure Laterality Date  . BACK SURGERY  1980's  . CHOLECYSTECTOMY  2005  . [place]  2011  . NECK SURGERY  2009  . NOSE SURGERY  2006        Home Medications    Prior to Admission medications   Medication Sig Start Date End Date Taking? Authorizing Provider  aspirin EC 81 MG tablet Take 81 mg by mouth once.   Yes [provider]  doxazosin (CARDURA) 4 MG tablet Take 4 mg by mouth at bedtime.  11/11/13   Yes [provider]  dutasteride (AVODART) 0.5 MG capsule Take 0.5 mg by mouth daily. 12/29/17  Yes [provider]  finasteride (PROSCAR) 5 MG tablet Take 5 mg by mouth daily.  11/28/13  Yes [provider]  HYDROcodone-acetaminophen (NORCO/VICODIN) 5-325 MG tablet Take 1 tablet by mouth daily as needed (for pain).  12/26/17  Yes [provider]  hydrocortisone cream (PREPARATION H) 1 % Apply 1 application topically 2 (two) times daily as needed for itching.   Yes [provider]  ibuprofen (ADVIL,MOTRIN) 200 MG tablet Take 400 mg by mouth 2 (two) times daily as needed for headache or mild pain.    Yes [provider]  losartan (COZAAR) 25 MG tablet Take 25 mg by mouth daily. 01/15/18  Yes [provider]  terbinafine (LAMISIL) 250 MG tablet Take 250 mg by mouth daily. 01/22/18  Yes [provider]  hydrocortisone (ANUSOL-HC) 25 MG suppository Place 25 mg rectally See admin instructions. UNWRAP AND INSERT 1 SUPPOSITORY RECTALLY TWICE DAILY AS NEEDED FOR HEMORRHOID SYMPTOMS 01/20/18   [provider]    Family History Family History  Problem Relation Age of Onset  . Cancer Mother        breast/colon  . Heart disease Father   . Stroke Neg Hx     Social History Social History   Tobacco Use  . Smoking status: Never Smoker  .  Smokeless tobacco: Never Used  Substance Use Topics  . Alcohol use: Not Currently    Alcohol/week: 0.0 standard drinks    Comment: occ.  . Drug use: No     Allergies   Patient has no known allergies.   Review of Systems Review of Systems  Constitutional: Negative for chills and fever.  HENT: Negative for ear pain and sore throat.   Eyes: Negative for pain and visual disturbance.  Respiratory: Negative for cough and shortness of breath.   Cardiovascular: Negative for chest pain and palpitations.  Gastrointestinal: Negative for abdominal pain, diarrhea, nausea and vomiting.    Genitourinary: Negative for dysuria and hematuria.  Musculoskeletal: Negative for back pain and neck pain.  Skin: Negative for color change and rash.  Neurological: Positive for dizziness, weakness, light-headedness and headaches. Negative for numbness.       Expressive aphagia  All other systems reviewed and are negative.   Physical Exam Updated Vital Signs BP (!) 158/89 (BP Location: Right Arm)   Pulse 81   Temp 98.2 F (36.8 C) (Oral)   Resp 18   SpO2 97%   Physical Exam  Constitutional: He appears well-developed and well-nourished.  HENT:  Head: Normocephalic and atraumatic.  Eyes: Conjunctivae are normal.  Neck: Neck supple.  Cardiovascular: Normal rate, regular rhythm, normal heart sounds and intact distal pulses.  No murmur heard. Pulmonary/Chest: Effort normal and breath sounds normal. No stridor. No respiratory distress. He has no wheezes.  Abdominal: Soft. Bowel sounds are normal. There is no tenderness.  Musculoskeletal: He exhibits no edema.  Neurological: He is alert.  Mental Status:  Alert, thought content appropriate, able to give a coherent history. Speech fluent without evidence of aphasia. Able to follow 2 step commands without difficulty.  Cranial Nerves:  II:  Peripheral visual fields grossly normal, right pupil slightly larger than left and nonreactive (chronic per pt) III,IV, VI: ptosis not present, extra-ocular motions intact bilaterally  V,VII: smile symmetric, facial light touch sensation equal VIII: hearing grossly normal to voice  X: uvula elevates symmetrically  XI: bilateral shoulder shrug symmetric and strong XII: midline tongue extension without fassiculations Motor:  Normal tone. 5/5 strength of BUE and BLE major muscle groups including strong and equal grip strength and dorsiflexion/plantar flexion (pt reporting subjective weakness to LUE, not appreciated on exam) Sensory: light touch normal in all extremities. Cerebellar: very subtle  dysmetria with LUE on finger to nose testing, normal heel to shin bilat Gait: not tested Negative pronator drift  Skin: Skin is warm and dry. Capillary refill takes less than 2 seconds.  Psychiatric:  Anxious, tearful  Nursing note and vitals reviewed.    ED Treatments / Results  Labs (all labs ordered are listed, but only abnormal results are displayed) Labs Reviewed  CBC - Abnormal; Notable for the following components:      Result Value   HCT 53.5 (*)    All other components within normal limits  COMPREHENSIVE METABOLIC PANEL - Abnormal; Notable for the following components:   Glucose, Bld 160 (*)    All other components within normal limits  URINALYSIS, ROUTINE W REFLEX MICROSCOPIC - Abnormal; Notable for the following components:   Specific Gravity, Urine 1.035 (*)    All other components within normal limits  I-STAT CHEM 8, ED - Abnormal; Notable for the following components:   Glucose, Bld 159 (*)    Hemoglobin 18.0 (*)    HCT 53.0 (*)    All other components within normal limits  CBG MONITORING, ED - Abnormal; Notable for the following components:   Glucose-Capillary 148 (*)    All other components within normal limits  ETHANOL  PROTIME-INR  APTT  DIFFERENTIAL  RAPID URINE DRUG SCREEN, HOSP PERFORMED  TSH  HEMOGLOBIN A1C  LIPID PANEL  I-STAT TROPONIN, ED    EKG EKG Interpretation  Date/Time:  Thursday January 29 2018 12:19:22 EDT Ventricular Rate:  77 PR Interval:    QRS Duration: 100 QT Interval:  385 QTC Calculation: 436 R Axis:   54 Text Interpretation:  Sinus rhythm Abnormal R-wave progression, late transition No significant change since last tracing Confirmed by Gareth Morgan [id number]) on 01/29/2018 12:43:18 PM   Radiology Ct Angio Head W Or Wo Contrast  Result Date: 01/29/2018 CLINICAL DATA:  Expressive aphasia, intermittent frontal headache, last seen well 1400 hours 01/28/2018. EXAM: CT ANGIOGRAPHY HEAD AND NECK TECHNIQUE: Multidetector CT  imaging of the head and neck was performed using the standard protocol during bolus administration of intravenous contrast. Multiplanar CT image reconstructions and MIPs were obtained to evaluate the vascular anatomy. Carotid stenosis measurements (when applicable) are obtained utilizing NASCET criteria, using the distal internal carotid diameter as the denominator. CONTRAST:  85mL ISOVUE-370 IOPAMIDOL (ISOVUE-370) INJECTION 76% COMPARISON:  None. FINDINGS: CT HEAD FINDINGS Brain: Indeterminate age hypodensity LEFT frontal operculum, see coronal image 36 and sagittal image 50, could represent an acute or subacute infarct. Generalized atrophy with hypoattenuation of white matter consistent with small vessel disease. Old RIGHT basal ganglia infarct. 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 Asymmetric choroid plexus less likely. The abnormality does not enhance postcontrast. No hemorrhage, mass lesion, hydrocephalus, or extra-axial fluid. Vascular: Reported separately Skull: Unremarkable Sinuses: BILATERAL maxillary sinus retention cysts. Orbits: Dense lenticular opacities.  No acute finding. Review of the MIP images confirms the above findings CTA NECK FINDINGS Aortic arch: Standard branching. Imaged portion shows no evidence of aneurysm or dissection. No significant stenosis of the major arch vessel origins. Right carotid system: No evidence of dissection, stenosis (50% or greater) or occlusion. Left carotid system: No evidence of dissection, stenosis (50% or greater) or occlusion. Vertebral arteries: Codominant. No evidence of dissection, stenosis (50% or greater) or occlusion. Skeleton: Spondylosis.  Unremarkable appearing C4-C6 ACDF. Other neck: No masses. Upper chest: No pneumothorax or infiltrate. Review of the MIP images confirms the above findings CTA HEAD FINDINGS Anterior circulation: No significant stenosis, proximal occlusion,  aneurysm, or vascular malformation. Minimal calcific atheromatous change BILATERAL carotid siphons. Posterior circulation: No significant stenosis, proximal occlusion, aneurysm, or vascular malformation. Venous sinuses: As permitted by contrast timing, patent. Anatomic variants: None of significance. Delayed phase: No abnormal postcontrast enhancement. Review of the MIP images confirms the above findings IMPRESSION: No intracranial or extracranial stenosis or occlusion of significance. Chronic RIGHT basal ganglia infarct. Equivocal acute to subacute infarct, LEFT frontal operculum area of hypoattenuation. If no contraindications, MRI could provide additional information. 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 Electronically Signed   By: Staci Righter M.D.   On: 01/29/2018 13:24   Ct Angio Neck W And/or Wo Contrast  Result Date: 01/29/2018 CLINICAL DATA:  Expressive aphasia, intermittent frontal headache, last seen well 1400 hours 01/28/2018. EXAM: CT ANGIOGRAPHY HEAD AND NECK TECHNIQUE: Multidetector CT imaging of the head and neck was performed using the standard protocol during bolus administration of intravenous contrast. Multiplanar CT image reconstructions and MIPs were obtained to evaluate the vascular anatomy. Carotid stenosis measurements (  when applicable) are obtained utilizing NASCET criteria, using the distal internal carotid diameter as the denominator. CONTRAST:  59mL ISOVUE-370 IOPAMIDOL (ISOVUE-370) INJECTION 76% COMPARISON:  None. FINDINGS: CT HEAD FINDINGS Brain: Indeterminate age hypodensity LEFT frontal operculum, see coronal image 36 and sagittal image 50, could represent an acute or subacute infarct. Generalized atrophy with hypoattenuation of white matter consistent with small vessel disease. Old RIGHT basal ganglia infarct. 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 Asymmetric choroid  plexus less likely. The abnormality does not enhance postcontrast. No hemorrhage, mass lesion, hydrocephalus, or extra-axial fluid. Vascular: Reported separately Skull: Unremarkable Sinuses: BILATERAL maxillary sinus retention cysts. Orbits: Dense lenticular opacities.  No acute finding. Review of the MIP images confirms the above findings CTA NECK FINDINGS Aortic arch: Standard branching. Imaged portion shows no evidence of aneurysm or dissection. No significant stenosis of the major arch vessel origins. Right carotid system: No evidence of dissection, stenosis (50% or greater) or occlusion. Left carotid system: No evidence of dissection, stenosis (50% or greater) or occlusion. Vertebral arteries: Codominant. No evidence of dissection, stenosis (50% or greater) or occlusion. Skeleton: Spondylosis.  Unremarkable appearing C4-C6 ACDF. Other neck: No masses. Upper chest: No pneumothorax or infiltrate. Review of the MIP images confirms the above findings CTA HEAD FINDINGS Anterior circulation: No significant stenosis, proximal occlusion, aneurysm, or vascular malformation. Minimal calcific atheromatous change BILATERAL carotid siphons. Posterior circulation: No significant stenosis, proximal occlusion, aneurysm, or vascular malformation. Venous sinuses: As permitted by contrast timing, patent. Anatomic variants: None of significance. Delayed phase: No abnormal postcontrast enhancement. Review of the MIP images confirms the above findings IMPRESSION: No intracranial or extracranial stenosis or occlusion of significance. Chronic RIGHT basal ganglia infarct. Equivocal acute to subacute infarct, LEFT frontal operculum area of hypoattenuation. If no contraindications, MRI could provide additional information. 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 Electronically Signed   By: Staci Righter M.D.   On: 01/29/2018 13:24   Mr Brain Wo Contrast  Result Date: 01/29/2018 CLINICAL DATA:  Expressive  aphasia, intermittent dizziness, and intermittent headaches. EXAM: MRI HEAD WITHOUT CONTRAST TECHNIQUE: Multiplanar, multiecho pulse sequences of the brain and surrounding structures were obtained without intravenous contrast. COMPARISON:  Head CT/CTA 01/29/2018 FINDINGS: Brain: A 1.5 cm acute cortically based infarct is noted in the left frontal operculum. Multiple additional smaller acute infarcts are noted more superiorly and left frontal white matter. No intracranial hemorrhage, midline shift, or extra-axial fluid collection is present. There is a chronic right basal ganglia infarct. T2 hyperintensities in the cerebral white matter bilaterally and in the pons are nonspecific but compatible with mild chronic small vessel ischemic disease. A small chronic infarct is noted in the left paramedian pons. There is mild cerebral atrophy. There is a 12 mm T2/FLAIR hyperintense mass in the body of the right lateral ventricle which did not appreciably enhance on today's earlier CT. Vascular: Major intracranial vascular flow voids are preserved. Skull and upper cervical spine: Unremarkable bone marrow signal. Susceptibility artifact at C4 related to anterior fusion. C3-4 disc degeneration. Sinuses/Orbits: Unremarkable orbits. Mild right maxillary sinus mucosal thickening. Left maxillary sinus mucous retention cyst. Clear mastoid air cells. Other: None. IMPRESSION: 1. Small acute left MCA infarcts in the frontal lobe. 2. Mild chronic small vessel ischemic disease. 3. 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 Electronically Signed   By: Logan Bores M.D.   On: 01/29/2018 17:04   Mr Brain W Contrast  Result Date: 01/29/2018 CLINICAL DATA:  Blurry vision and word-finding difficulties. Headaches. EXAM: MRI HEAD WITH CONTRAST MRA HEAD WITHOUT CONTRAST TECHNIQUE: Multiplanar, multiecho pulse sequences of the brain and surrounding structures were obtained with intravenous contrast. Angiographic images of the  head were obtained using MRA technique without contrast. CONTRAST:  10 mL Gadavist COMPARISON:  Brain MRI without 01/29/2018 FINDINGS: MRI HEAD FINDINGS There is no contrast enhancement of the intraventricular mass located in the right lateral ventricle. No abnormal contrast enhancement elsewhere within the brain. Old right basal ganglia infarct. MRA HEAD FINDINGS Intracranial internal carotid arteries: Normal. Anterior cerebral arteries: Normal. Middle cerebral arteries: Normal. Posterior communicating arteries: Absent bilaterally. Posterior cerebral arteries: Normal. Basilar artery: Normal. Vertebral arteries: Left dominant. Normal. Superior cerebellar arteries: Normal. Inferior cerebellar arteries: Normal. IMPRESSION: 1. No contrast enhancement at the right lateral intraventricular mass, typical of subependymoma. 2. Normal intracranial MRA. Electronically Signed   By: Ulyses Jarred M.D.   On: 01/29/2018 19:19   Mr Jodene Nam Head Wo Contrast  Result Date: 01/29/2018 CLINICAL DATA:  Blurry vision and word-finding difficulties. Headaches. EXAM: MRI HEAD WITH CONTRAST MRA HEAD WITHOUT CONTRAST TECHNIQUE: Multiplanar, multiecho pulse sequences of the brain and surrounding structures were obtained with intravenous contrast. Angiographic images of the head were obtained using MRA technique without contrast. CONTRAST:  10 mL Gadavist COMPARISON:  Brain MRI without 01/29/2018 FINDINGS: MRI HEAD FINDINGS There is no contrast enhancement of the intraventricular mass located in the right lateral ventricle. No abnormal contrast enhancement elsewhere within the brain. Old right basal ganglia infarct. MRA HEAD FINDINGS Intracranial internal carotid arteries: Normal. Anterior cerebral arteries: Normal. Middle cerebral arteries: Normal. Posterior communicating arteries: Absent bilaterally. Posterior cerebral arteries: Normal. Basilar artery: Normal. Vertebral arteries: Left dominant. Normal. Superior cerebellar arteries: Normal.  Inferior cerebellar arteries: Normal. IMPRESSION: 1. No contrast enhancement at the right lateral intraventricular mass, typical of subependymoma. 2. Normal intracranial MRA. Electronically Signed   By: Ulyses Jarred M.D.   On: 01/29/2018 19:19    Procedures Procedures (including critical care time)   Medications Ordered in ED Medications  HYDROcodone-acetaminophen (NORCO/VICODIN) 5-325 MG per tablet 1 tablet (has no administration in time range)  terbinafine (LAMISIL) tablet 250 mg (250 mg Oral Given 01/29/18 2022)  dutasteride (AVODART) capsule 0.5 mg (has no administration in time range)  finasteride (PROSCAR) tablet 5 mg (has no administration in time range)  enoxaparin (LOVENOX) injection 40 mg (40 mg Subcutaneous Given 01/29/18 1945)   stroke: mapping our early stages of recovery book (has no administration in time range)  0.9 %  sodium chloride infusion ( Intravenous New Bag/Given 01/29/18 1948)  acetaminophen (TYLENOL) tablet 650 mg (has no administration in time range)    Or  acetaminophen (TYLENOL) solution 650 mg (has no administration in time range)    Or  acetaminophen (TYLENOL) suppository 650 mg (has no administration in time range)  senna-docusate (Senokot-S) tablet 1 tablet (has no administration in time range)  atorvastatin (LIPITOR) tablet 80 mg (has no administration in time range)  iopamidol (ISOVUE-370) 76 % injection 50 mL (50 mLs Intravenous Contrast Given 01/29/18 1238)  LORazepam (ATIVAN) injection 0.5 mg (0.5 mg Intravenous Given 01/29/18 1553)  aspirin EC tablet 81 mg (81 mg Oral Given 01/29/18 1945)  gadobutrol (GADAVIST) 1 MMOL/ML injection 10 mL (10 mLs Intravenous Contrast Given 01/29/18 1857)     Initial Impression / Assessment and Plan / ED Course  I have reviewed the triage vital signs and the nursing notes.  Pertinent labs & imaging  results that were available during my care of the patient were reviewed by me and considered in my medical decision making  (see chart for details).   1:08 PM discussed case and physical exam with neurology, Dr. Earnestine Leys, who does not feel that stroke code is necessary based on grossly negative physical exam.  CONSULT with neurology, Etta Quill, PA-C who evaluated the pt. He recommends ordering an MRI and having the pt admitted to hospitalist service for stroke workup.   CONSULT with Dr. Lorin Mercy with hospitalist service who will admit the pt.  Final Clinical Impressions(s) / ED Diagnoses   Final diagnoses:  Stroke-like symptoms   Pt presenting to the ED today c/o expressive aphagia beginning yesterday at 1400. Also had some subjective weakness to LUE only during physical exam.   Neuro exam without evidence of expressive aphagia on my exam. Pupils are abnormal with right pupil larger than left and nonreactive (chronic per pt). No sensory change and no appreciable weakness on exam. Negative pronator drift. Mild dysmetria with LUE, however BLE WNL.   Labs reassuring with normal CBC, CMP, coags, troponin, ETOH. Negative UDS. UA negative.   ECG with NSR, no ischemic changes, unchanged from previous.  CTA head and neck ordered and noted to have no intracranial or extracranial stenosis or occlusion of significance. Old right basal ganglia infarct. Equivocal acute to subacute infarct to the left frontal operculum and 1cm right lateral ventricular mass suggestive of incidental subependymoma.  Neurology has evaluated the pt and recommended imaging as above. Also recommended admission to the hospitalist service for stroke evaluation.   Pt admitted to hospitalist service.   ED Discharge Orders    None       Bishop Dublin 01/29/18 2239    Gareth Morgan, MD 02/01/18 [phone]

## 2018-01-29 NOTE — H&P (Signed)
History and Physical    [name] VQM:[id number] DOB: Jun 22, 1945 DOA: 01/29/2018  PCP:  Eugene Bender, PA-C Consultants:  None Patient coming from:  Home - lives with wife; [name]: Wife, [phone]  Chief Complaint:  Expressive aphasia  HPI: [name] is a 72 y.o. male with medical history significant of HTN presenting with expressive aphasia. Yesterday, about 3PM, he was bringing a rug into the house.  He was trying to describe to his neighbor where he wanted and couldn't get the words out.  He went to the doctor this morning and he couldn't get the words out.  He can respond to questions but just can't think of what to say.  Yesterday, he was unable to say anything, but today he is able to describe this situation.  He was also hot, sweaty, nauseated yesterday and then had some hot/cold changes, didn't sleep very well.  He has had headaches with a feeling like he is going to pass out with disorientation x 3 weeks.  ?Chronic anisocoria - eye doctor mentioned it during a prior visit.  Occasional vision problems, not changed.  Occasional dysphagia, has to take small bites at a time, but this is unchanged.  No numbness/weakness/tingling.   ED Course:  Expressive aphasia intermittently from yesterday afternoon.  Intermittent blurred vision and weakness.  Equivocal CT.  Needs MRI and stroke work-up, as per neurology.  Review of Systems: As per HPI; otherwise review of systems reviewed and negative.   Ambulatory Status:  Ambulates without assistance  Past Medical History:  Diagnosis Date  . Diarrhea   . Difficulty urinating   . Hypertension   . Rectal bleeding   . Rectal pain     Past Surgical History:  Procedure Laterality Date  . BACK SURGERY  1980's  . CHOLECYSTECTOMY  2005  . [place]  2011  . NECK SURGERY  2009  . NOSE SURGERY  2006    Social History   Socioeconomic History  . Marital status: Married    Spouse name: Not on file  . Number of children: Not on file    . Years of education: Not on file  . Highest education level: Not on file  Occupational History  . Occupation: retired  Scientific laboratory technician  . Financial resource strain: Not on file  . Food insecurity:    Worry: Not on file    Inability: Not on file  . Transportation needs:    Medical: Not on file    Non-medical: Not on file  Tobacco Use  . Smoking status: Never Smoker  . Smokeless tobacco: Never Used  Substance and Sexual Activity  . Alcohol use: Not Currently    Alcohol/week: 0.0 standard drinks    Comment: occ.  . Drug use: No  . Sexual activity: Not on file  Lifestyle  . Physical activity:    Days per week: Not on file    Minutes per session: Not on file  . Stress: Not on file  Relationships  . Social connections:    Talks on phone: Not on file    Gets together: Not on file    Attends religious service: Not on file    Active member of club or organization: Not on file    Attends meetings of clubs or organizations: Not on file    Relationship status: Not on file  . Intimate partner violence:    Fear of current or ex partner: Not on file    Emotionally abused: Not on  file    Physically abused: Not on file    Forced sexual activity: Not on file  Other Topics Concern  . Not on file  Social History Narrative  . Not on file    No Known Allergies  Family History  Problem Relation Age of Onset  . Cancer Mother        breast/colon  . Heart disease Father   . Stroke Neg Hx     Prior to Admission medications   Medication Sig Start Date End Date Taking? Authorizing Provider  amoxicillin-clavulanate (AUGMENTIN) 875-125 MG per tablet Take 1 tablet by mouth 2 (two) times daily. 09/07/14   Bronson Ing, DPM  doxazosin (CARDURA) 4 MG tablet  11/11/13   [provider]  finasteride (PROSCAR) 5 MG tablet  11/28/13   [provider]  naproxen sodium (ANAPROX) 220 MG tablet Take 220 mg by mouth as needed.    [provider]    Physical  Exam: Vitals:   01/29/18 1530 01/29/18 1552 01/29/18 1736 01/29/18 1747  BP: 134/67   (!) 144/83  Pulse: 78   94  Resp:    20  Temp:  99 F (37.2 C) 98.6 F (37 C)   TempSrc:  Oral    SpO2: 91%   99%     General:  Appears calm and comfortable and is NAD Eyes:  PERRL, EOMI, normal lids, iris; he does have R anisocoria, at times subtle and at other times quite obviously ENT:  grossly normal hearing, lips & tongue, mmm Neck:  no LAD, masses or thyromegaly; no carotid bruits Cardiovascular:  RRR, no m/r/g. No LE edema.  Respiratory:   CTA bilaterally with no wheezes/rales/rhonchi.  Normal respiratory effort. Abdomen:  soft, NT, ND, NABS Back:   normal alignment, no CVAT Skin:  no rash or induration seen on limited exam Musculoskeletal:  grossly normal tone BUE/BLE, good ROM, no bony abnormality Lower extremity:  No LE edema.  Limited foot exam with no ulcerations.  2+ distal pulses. Psychiatric:  grossly normal mood and affect, speech fluent and appropriate, AOx3.  Very little expressive aphasia noticeable on exam at the time of admission. Neurologic:  CN 2-12 grossly intact, moves all extremities in coordinated fashion, sensation intact    Radiological Exams on Admission: Ct Angio Head W Or Wo Contrast  Result Date: 01/29/2018 CLINICAL DATA:  Expressive aphasia, intermittent frontal headache, last seen well 1400 hours 01/28/2018. EXAM: CT ANGIOGRAPHY HEAD AND NECK TECHNIQUE: Multidetector CT imaging of the head and neck was performed using the standard protocol during bolus administration of intravenous contrast. Multiplanar CT image reconstructions and MIPs were obtained to evaluate the vascular anatomy. Carotid stenosis measurements (when applicable) are obtained utilizing NASCET criteria, using the distal internal carotid diameter as the denominator. CONTRAST:  57mL ISOVUE-370 IOPAMIDOL (ISOVUE-370) INJECTION 76% COMPARISON:  None. FINDINGS: CT HEAD FINDINGS Brain: Indeterminate age  hypodensity LEFT frontal operculum, see coronal image 36 and sagittal image 50, could represent an acute or subacute infarct. Generalized atrophy with hypoattenuation of white matter consistent with small vessel disease. Old RIGHT basal ganglia infarct. 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 Asymmetric choroid plexus less likely. The abnormality does not enhance postcontrast. No hemorrhage, mass lesion, hydrocephalus, or extra-axial fluid. Vascular: Reported separately Skull: Unremarkable Sinuses: BILATERAL maxillary sinus retention cysts. Orbits: Dense lenticular opacities.  No acute finding. Review of the MIP images confirms the above findings CTA NECK FINDINGS  Aortic arch: Standard branching. Imaged portion shows no evidence of aneurysm or dissection. No significant stenosis of the major arch vessel origins. Right carotid system: No evidence of dissection, stenosis (50% or greater) or occlusion. Left carotid system: No evidence of dissection, stenosis (50% or greater) or occlusion. Vertebral arteries: Codominant. No evidence of dissection, stenosis (50% or greater) or occlusion. Skeleton: Spondylosis.  Unremarkable appearing C4-C6 ACDF. Other neck: No masses. Upper chest: No pneumothorax or infiltrate. Review of the MIP images confirms the above findings CTA HEAD FINDINGS Anterior circulation: No significant stenosis, proximal occlusion, aneurysm, or vascular malformation. Minimal calcific atheromatous change BILATERAL carotid siphons. Posterior circulation: No significant stenosis, proximal occlusion, aneurysm, or vascular malformation. Venous sinuses: As permitted by contrast timing, patent. Anatomic variants: None of significance. Delayed phase: No abnormal postcontrast enhancement. Review of the MIP images confirms the above findings IMPRESSION: No intracranial or extracranial stenosis or occlusion of significance. Chronic RIGHT  basal ganglia infarct. Equivocal acute to subacute infarct, LEFT frontal operculum area of hypoattenuation. If no contraindications, MRI could provide additional information. 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 Electronically Signed   By: Staci Righter M.D.   On: 01/29/2018 13:24   Ct Angio Neck W And/or Wo Contrast  Result Date: 01/29/2018 CLINICAL DATA:  Expressive aphasia, intermittent frontal headache, last seen well 1400 hours 01/28/2018. EXAM: CT ANGIOGRAPHY HEAD AND NECK TECHNIQUE: Multidetector CT imaging of the head and neck was performed using the standard protocol during bolus administration of intravenous contrast. Multiplanar CT image reconstructions and MIPs were obtained to evaluate the vascular anatomy. Carotid stenosis measurements (when applicable) are obtained utilizing NASCET criteria, using the distal internal carotid diameter as the denominator. CONTRAST:  24mL ISOVUE-370 IOPAMIDOL (ISOVUE-370) INJECTION 76% COMPARISON:  None. FINDINGS: CT HEAD FINDINGS Brain: Indeterminate age hypodensity LEFT frontal operculum, see coronal image 36 and sagittal image 50, could represent an acute or subacute infarct. Generalized atrophy with hypoattenuation of white matter consistent with small vessel disease. Old RIGHT basal ganglia infarct. 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 Asymmetric choroid plexus less likely. The abnormality does not enhance postcontrast. No hemorrhage, mass lesion, hydrocephalus, or extra-axial fluid. Vascular: Reported separately Skull: Unremarkable Sinuses: BILATERAL maxillary sinus retention cysts. Orbits: Dense lenticular opacities.  No acute finding. Review of the MIP images confirms the above findings CTA NECK FINDINGS Aortic arch: Standard branching. Imaged portion shows no evidence of aneurysm or dissection. No significant stenosis of the major arch vessel  origins. Right carotid system: No evidence of dissection, stenosis (50% or greater) or occlusion. Left carotid system: No evidence of dissection, stenosis (50% or greater) or occlusion. Vertebral arteries: Codominant. No evidence of dissection, stenosis (50% or greater) or occlusion. Skeleton: Spondylosis.  Unremarkable appearing C4-C6 ACDF. Other neck: No masses. Upper chest: No pneumothorax or infiltrate. Review of the MIP images confirms the above findings CTA HEAD FINDINGS Anterior circulation: No significant stenosis, proximal occlusion, aneurysm, or vascular malformation. Minimal calcific atheromatous change BILATERAL carotid siphons. Posterior circulation: No significant stenosis, proximal occlusion, aneurysm, or vascular malformation. Venous sinuses: As permitted by contrast timing, patent. Anatomic variants: None of significance. Delayed phase: No abnormal postcontrast enhancement. Review of the MIP images confirms the above findings IMPRESSION: No intracranial or extracranial stenosis or occlusion of significance. Chronic RIGHT basal ganglia infarct. Equivocal acute to subacute infarct, LEFT frontal operculum area of hypoattenuation. If no contraindications, MRI could provide additional information. 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 Electronically Signed   By: Staci Righter M.D.   On: 01/29/2018 13:24   Mr Brain Wo Contrast  Result Date: 01/29/2018 CLINICAL DATA:  Expressive aphasia, intermittent dizziness, and intermittent headaches. EXAM: MRI HEAD WITHOUT CONTRAST TECHNIQUE: Multiplanar, multiecho pulse sequences of the brain and surrounding structures were obtained without intravenous contrast. COMPARISON:  Head CT/CTA 01/29/2018 FINDINGS: Brain: A 1.5 cm acute cortically based infarct is noted in the left frontal operculum. Multiple additional smaller acute infarcts are noted more superiorly and left frontal white matter. No intracranial hemorrhage, midline  shift, or extra-axial fluid collection is present. There is a chronic right basal ganglia infarct. T2 hyperintensities in the cerebral white matter bilaterally and in the pons are nonspecific but compatible with mild chronic small vessel ischemic disease. A small chronic infarct is noted in the left paramedian pons. There is mild cerebral atrophy. There is a 12 mm T2/FLAIR hyperintense mass in the body of the right lateral ventricle which did not appreciably enhance on today's earlier CT. Vascular: Major intracranial vascular flow voids are preserved. Skull and upper cervical spine: Unremarkable bone marrow signal. Susceptibility artifact at C4 related to anterior fusion. C3-4 disc degeneration. Sinuses/Orbits: Unremarkable orbits. Mild right maxillary sinus mucosal thickening. Left maxillary sinus mucous retention cyst. Clear mastoid air cells. Other: None. IMPRESSION: 1. Small acute left MCA infarcts in the frontal lobe. 2. Mild chronic small vessel ischemic disease. 3. 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 Electronically Signed   By: Logan Bores M.D.   On: 01/29/2018 17:04    EKG: Independently reviewed.  NSR with rate 77; nonspecific ST changes with no evidence of acute ischemia   Labs on Admission: I have personally reviewed the available labs and imaging studies at the time of the admission.  Pertinent labs:   Glucose 160 Otherwise normal CMP Essentially normal CBC Troponin 0.00 Unremarkable UA Negative UDS   Assessment/Plan Principal Problem:   CVA (cerebral vascular accident) ([place]) Active Problems:   Brain tumor ([place])   Essential hypertension   BPH (benign prostatic hyperplasia)   CVA -Patient with acute onset of expressive aphasia yesterday, with improvements since Concerning for TIA/CVA -Will place in observation status for CVA/TIA evaluation -Telemetry monitoring -MRI/MRA -CTA head/neck performed with chronic R basal ganglia infarct; equivocal acute to  subacute infarct; and 1 cm right lateral ventricular mass, 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  Will continue to monitor there for now based on his generally mild deficits at this time.  HTN -Allow permissive HTN for now -Treat BP only if >220/120, and then with goal of 15% reduction -Hold Cozaar and Cardura and plan to restart in 48-72 hours   Brain tumor -Possible incidental finding of 1 cm lateral ventricle tumor -While this may be a slow-growing ependymoma, this is not clear -After discussion with Dr. Cheral Marker, he is already going back for MRA and we can add pre- and post-contrast MRI images to further assess this tumor -While he is very unlikely to be a neurosurgical candidate at this time due to his acute CVA, it is likely reasonable to consult neurosurgery to see the patient tomorrow.  BPH -Hold Cardura -Continue  Avodart  Hyperglycemia -Check A1c -He may need SSI if glucose continues to increase     DVT prophylaxis:  Lovenox  Code Status: DNR - confirmed with patient/family Family Communication: Wife and son were present throughout evaluation Disposition Plan:  Home once clinically improved Consults called: Neurology; PT/OT/ST/Nutrition  Admission status: It is my clinical opinion that referral for OBSERVATION is reasonable and necessary in this patient based on the above information provided. The aforementioned taken together are felt to place the patient at high risk for further clinical deterioration. However it is anticipated that the patient may be medically stable for discharge from the hospital within 24 to 48  hours.     Karmen Bongo MD [facility]  If note is complete, please contact covering daytime or nighttime physician. [email or web address] Password [facility]  01/29/2018, 6:06 PM

## 2018-01-29 NOTE — ED Notes (Signed)
Pt returned from imaging.

## 2018-01-30 ENCOUNTER — Other Ambulatory Visit: Payer: Self-pay | Admitting: Physician Assistant

## 2018-01-30 ENCOUNTER — Observation Stay (HOSPITAL_BASED_OUTPATIENT_CLINIC_OR_DEPARTMENT_OTHER): Payer: Medicare Other

## 2018-01-30 ENCOUNTER — Other Ambulatory Visit: Payer: Self-pay

## 2018-01-30 DIAGNOSIS — E785 Hyperlipidemia, unspecified: Secondary | ICD-10-CM

## 2018-01-30 DIAGNOSIS — I63412 Cerebral infarction due to embolism of left middle cerebral artery: Secondary | ICD-10-CM | POA: Diagnosis not present

## 2018-01-30 DIAGNOSIS — I10 Essential (primary) hypertension: Secondary | ICD-10-CM | POA: Diagnosis not present

## 2018-01-30 DIAGNOSIS — I639 Cerebral infarction, unspecified: Secondary | ICD-10-CM | POA: Diagnosis not present

## 2018-01-30 DIAGNOSIS — R002 Palpitations: Secondary | ICD-10-CM

## 2018-01-30 DIAGNOSIS — D496 Neoplasm of unspecified behavior of brain: Secondary | ICD-10-CM | POA: Diagnosis not present

## 2018-01-30 LAB — GLUCOSE, CAPILLARY: GLUCOSE-CAPILLARY: 155 mg/dL — AB (ref 70–99)

## 2018-01-30 LAB — LIPID PANEL
Cholesterol: 207 mg/dL — ABNORMAL HIGH (ref 0–200)
HDL: 30 mg/dL — AB (ref >40)
LDL CALC: 113 mg/dL — AB (ref 0–99)
TRIGLYCERIDES: 321 mg/dL — AB (ref <150)
Total CHOL/HDL Ratio: 6.9 RATIO
VLDL: 64 mg/dL — AB (ref 0–40)

## 2018-01-30 LAB — HEMOGLOBIN A1C
HEMOGLOBIN A1C: 6.5 % — AB (ref 4.8–5.6)
Mean Plasma Glucose: 139.85 mg/dL

## 2018-01-30 MED ORDER — CLOPIDOGREL BISULFATE 75 MG PO TABS: 75.0000 mg | ORAL_TABLET | Freq: Every day | ORAL | 0 refills | Status: DC

## 2018-01-30 MED ORDER — CLOPIDOGREL BISULFATE 75 MG PO TABS
75.0000 mg | ORAL_TABLET | Freq: Every day | ORAL | Status: DC
  Administered 2018-01-30: 75 mg via ORAL
  Filled 2018-01-30: qty 1

## 2018-01-30 MED ORDER — ATORVASTATIN CALCIUM 20 MG PO TABS: 20.0000 mg | ORAL_TABLET | Freq: Every day | ORAL | Status: DC

## 2018-01-30 MED ORDER — ATORVASTATIN CALCIUM 20 MG PO TABS: 20.0000 mg | ORAL_TABLET | Freq: Every day | ORAL | 0 refills | Status: DC

## 2018-01-30 MED ORDER — ASPIRIN EC 81 MG PO TBEC
81.0000 mg | DELAYED_RELEASE_TABLET | Freq: Every day | ORAL | Status: DC
  Administered 2018-01-30: 81 mg via ORAL
  Filled 2018-01-30: qty 1

## 2018-01-30 NOTE — Evaluation (Signed)
Physical Therapy Evaluation Patient Details Name: [name] MRN: [id number] DOB: 1946-01-24 Today's Date: 01/30/2018   History of Present Illness  Pt is a 72 y.o. M with significant PMH of HTN presenting with expressive aphasia. 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   Clinical Impression  Patient presenting close to baseline in regards to functional mobility. Ambulating 350 feet with no assistive device, with gait speed indicative of limited community ambulator. Scoring 20/24 on the Dynamic Gait Index, indicating he is not at high risk for falls. Strength and coordination equal and intact; he denies changes in vision. Main residual deficit is continued expressive aphasia and did note flat affect. Will defer to SLP for further follow up. No further acute PT needs; patient with no further questions/concerns. PT signing off.     Follow Up Recommendations No PT follow up    Equipment Recommendations  None recommended by PT    Recommendations for Other Services       Precautions / Restrictions Precautions Precautions: None Restrictions Weight Bearing Restrictions: No      Mobility  Bed Mobility Overal bed mobility: Independent                Transfers Overall transfer level: Independent                  Ambulation/Gait Ambulation/Gait assistance: Modified independent (Device/Increase time)((increased time)) Gait Distance (Feet): 350 Feet Assistive device: None Gait Pattern/deviations: WFL(Within Functional Limits) Gait velocity: 1.49 ft/s Gait velocity interpretation: 1.31 - 2.62 ft/sec, indicative of limited community ambulator General Gait Details: Slow and steady gait, no evidence of imbalance  Stairs            Wheelchair Mobility    Modified Rankin (Stroke Patients Only) Modified Rankin (Stroke Patients Only) Pre-Morbid  Rankin Score: No symptoms Modified Rankin: No significant disability     Balance Overall balance assessment: Independent                               Standardized Balance Assessment Standardized Balance Assessment : Dynamic Gait Index   Dynamic Gait Index Level Surface: Mild Impairment Change in Gait Speed: Mild Impairment Gait with Horizontal Head Turns: Normal Gait with Vertical Head Turns: Normal Gait and Pivot Turn: Mild Impairment Step Over Obstacle: Normal Step Around Obstacles: Normal Steps: Mild Impairment Total Score: 20       Pertinent Vitals/Pain Pain Assessment: No/denies pain    Home Living Family/patient expects to be discharged to:: Private residence Living Arrangements: Spouse/significant other Available Help at Discharge: Family;Available PRN/intermittently Type of Home: House Home Access: Stairs to enter Entrance Stairs-Rails: Right Entrance Stairs-Number of Steps: 4 Home Layout: Two level;Able to live on main level with bedroom/bathroom Home Equipment: Kasandra Knudsen - single point      Prior Function Level of Independence: Independent with assistive device(s)         Comments: uses cane secondary to chronic knee issues     Hand Dominance        Extremity/Trunk Assessment   Upper Extremity Assessment Upper Extremity Assessment: RUE deficits/detail;LUE deficits/detail RUE Deficits / Details: 5/5. AROM shoulder flexion ~120 LUE Deficits / Details: 5/5. AROM shoulder flexion ~120    Lower Extremity Assessment Lower Extremity Assessment: RLE deficits/detail;LLE deficits/detail RLE Deficits / Details: 5/5 RLE Coordination: WNL LLE Deficits / Details: 5/5 LLE  Coordination: WNL    Cervical / Trunk Assessment Cervical / Trunk Assessment: Normal  Communication   Communication: Expressive difficulties  Cognition Arousal/Alertness: Awake/alert Behavior During Therapy: Flat affect Overall Cognitive Status: Within Functional Limits  for tasks assessed                                 General Comments: Alert and oriented x 3, following multi step commands, however, does display very flat affect and reserved personality; unsure what baseline for him is as no family/caregiver present. Continued expressive aphasia, with occasional difficulty getting words out and speaking in mainly one word sentences. States he has 4 "flights" to enter his home, instead of 4 "steps."       General Comments      Exercises     Assessment/Plan    PT Assessment Patent does not need any further PT services  PT Problem List         PT Treatment Interventions      PT Goals (Current goals can be found in the Care Plan section)  Acute Rehab PT Goals Patient Stated Goal: none stated; agreeable to participate in physical therapy evaluation PT Goal Formulation: All assessment and education complete, DC therapy    Frequency     Barriers to discharge        Co-evaluation               AM-PAC PT "6 Clicks" Daily Activity  Outcome Measure Difficulty turning over in bed (including adjusting bedclothes, sheets and blankets)?: None Difficulty moving from lying on back to sitting on the side of the bed? : None Difficulty sitting down on and standing up from a chair with arms (e.g., wheelchair, bedside commode, etc,.)?: None Help needed moving to and from a bed to chair (including a wheelchair)?: None Help needed walking in hospital room?: None Help needed climbing 3-5 steps with a railing? : None 6 Click Score: 24    End of Session Equipment Utilized During Treatment: Gait belt Activity Tolerance: Patient tolerated treatment well Patient left: in bed;with call bell/phone within reach Nurse Communication: Mobility status PT Visit Diagnosis: Other symptoms and signs involving the nervous system (R29.898);Unsteadiness on feet (R26.81)    Time: 2836-6294 PT Time Calculation (min) (ACUTE ONLY): 17 min   Charges:    PT Evaluation $PT Eval Moderate Complexity: 1 Mod        Ellamae Sia, [place], DPT Acute Rehabilitation Services Pager [phone] Office [phone]   Willy Eddy 01/30/2018, 10:17 AM

## 2018-01-30 NOTE — Progress Notes (Signed)
Reviewed AVS

## 2018-01-30 NOTE — Progress Notes (Signed)
STROKE TEAM PROGRESS NOTE   SUBJECTIVE (INTERVAL HISTORY) His RN and OT are at the bedside.  Pt speech much improved and seems at baseline now. Able to walk with OT in room. He admitted that he has intermittent heart palpitations and lasting 5-10 min. It can happen during the day or at night in bed. I recommend heart monitoring for him and he agreed. I then discussed with EP NP Renee and recommend 30 day cardiac monitoring given symptoms. Pt agreed.    OBJECTIVE Vitals:   01/30/18 0834 01/30/18 0945 01/30/18 1053 01/30/18 1117  BP: 134/75  137/72   Pulse: 73  72   Resp: 16  16   Temp: 98.1 F (36.7 C)  98.3 F (36.8 C)   TempSrc: Oral  Oral   SpO2: 94%  93%   Weight:  117.5 kg    Height:    5\' 10"  (1.778 m)    CBC:  Recent Labs  Lab 01/29/18 1220 01/29/18 1230  WBC 9.4  --   NEUTROABS 6.1  --   HGB 17.0 18.0*  HCT 53.5* 53.0*  MCV 97.3  --   PLT 201  --     Basic Metabolic Panel:  Recent Labs  Lab 01/29/18 1220 01/29/18 1230  NA 140 138  K 4.6 4.3  CL 99 99  CO2 29  --   GLUCOSE 160* 159*  BUN 9 11  CREATININE 1.11 1.10  CALCIUM 9.6  --     Lipid Panel:     Component Value Date/Time   CHOL 207 (H) 01/30/2018 0347   TRIG 321 (H) 01/30/2018 0347   HDL 30 (L) 01/30/2018 0347   CHOLHDL 6.9 01/30/2018 0347   VLDL 64 (H) 01/30/2018 0347   LDLCALC 113 (H) 01/30/2018 0347   HgbA1c:  Lab Results  Component Value Date   HGBA1C 6.5 (H) 01/30/2018   Urine Drug Screen:     Component Value Date/Time   LABOPIA NONE DETECTED 01/29/2018 1228   COCAINSCRNUR NONE DETECTED 01/29/2018 1228   LABBENZ NONE DETECTED 01/29/2018 1228   AMPHETMU NONE DETECTED 01/29/2018 1228   THCU NONE DETECTED 01/29/2018 1228   LABBARB NONE DETECTED 01/29/2018 1228    Alcohol Level     Component Value Date/Time   ETH <10 01/29/2018 1228    IMAGING   Ct Angio Head W Or Wo Contrast Ct Angio Neck W And/or Wo Contrast 01/29/2018 IMPRESSION:  No intracranial or extracranial  stenosis or occlusion of significance. Chronic RIGHT basal ganglia infarct. Equivocal acute to subacute infarct, LEFT frontal operculum area of hypoattenuation. If no contraindications, MRI could provide additional information. 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    Mr Brain Wo Contrast 01/29/2018 IMPRESSION:  1. Small acute left MCA infarcts in the frontal lobe.  2. Mild chronic small vessel ischemic disease.  3. 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    Mri with contrast and Mra Head Wo Contrast 01/29/2018 IMPRESSION:  1. No contrast enhancement at the right lateral intraventricular mass, typical of subependymoma.  2. Normal intracranial MRA.     Transthoracic Echocardiogram  11/21/2017 Study Conclusions - Left ventricle: The cavity size was normal. There was severe   concentric hypertrophy. Systolic function was vigorous. The   estimated ejection fraction was in the range of 65% to 70%. Wall   motion was normal; there were no regional wall motion   abnormalities. There was an increased relative contribution of   atrial contraction to ventricular  filling. Doppler parameters are   consistent with abnormal left ventricular relaxation (grade 1   diastolic dysfunction). - Aortic valve: Valve mobility was restricted. There was moderate   stenosis. Mean gradient (S): 23 mm Hg. Valve area (VTI): 1.24   cm^2. Valve area (Vmax): 1.01 cm^2. Valve area (Vmean): 1.08   cm^2. - Aorta: Aortic root dimension: 37 mm (ED). Ascending aorta   diameter: 39 mm (ED). - Aortic root: The aortic root was mildly dilated. - Left atrium: The atrium was moderately dilated. - Pulmonic valve: There was trivial regurgitation. - Pulmonary arteries: Systolic pressure could not be accurately   estimated.    BLE Venous Dopplers - negative for DVT     PHYSICAL EXAM Temp:  [97.8 F (36.6 C)-99 F (37.2 C)] 98.3 F (36.8 C) (09/27 1053) Pulse Rate:   [72-94] 72 (09/27 1053) Resp:  [16-24] 16 (09/27 1053) BP: (131-175)/(67-97) 137/72 (09/27 1053) SpO2:  [91 %-99 %] 93 % (09/27 1053) Weight:  [117.5 kg] 117.5 kg (09/27 0945)  General - Well nourished, well developed, in no apparent distress.  Ophthalmologic - fundi not visualized due to noncooperation.  Cardiovascular - Regular rate and rhythm.  Mental Status -  Level of arousal and orientation to time, place, and person were intact. Language including expression, naming, repetition, comprehension was assessed and found intact. Attention span and concentration were normal. Fund of Knowledge was assessed and was intact.  Cranial Nerves II - XII - II - Visual field intact OU. III, IV, VI - Extraocular movements intact. V - Facial sensation intact bilaterally. VII - Facial movement intact bilaterally. VIII - Hearing & vestibular intact bilaterally. X - Palate elevates symmetrically. XI - Chin turning & shoulder shrug intact bilaterally. XII - Tongue protrusion intact.  Motor Strength - The patient's strength was normal in all extremities and pronator drift was absent.  Bulk was normal and fasciculations were absent.   Motor Tone - Muscle tone was assessed at the neck and appendages and was normal.  Reflexes - The patient's reflexes were symmetrical in all extremities and he had no pathological reflexes.  Sensory - Light touch, temperature/pinprick were assessed and were symmetrical.    Coordination - The patient had normal movements in the hands and feet with no ataxia or dysmetria.  Tremor was absent.  Gait and Station - deferred.   ASSESSMENT/PLAN [name] is a 72 y.o. male with history of hypertension and previous stroke by imaging presenting with speech difficulties and blurred vision. He did not receive IV t-PA due to late presentation.  Stroke:  Small acute Lt MCA frontal cortical infarct - embolic - highly suspicious for afib giving intermittent heart  palpitation symptoms.  Resultant  baseline  CT head - chronic RIGHT basal ganglia infarct. Equivocal acute to subacute infarct  B LE dopplers - no DVT  MRI head - Small acute Lt MCA infarcts frontal lobe. 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   MRA head - normal  CTA H&N - No intracranial or extracranial stenosis or occlusion of significance.  2D Echo 11/21/2017 - EF 65 - 70%. No cardiac source of emboli identified. AV stenosis.   LDL - 113  HgbA1c - 6.5  VTE prophylaxis - Lovenox  Pt endorsed intermittent heart palpitation, highly concern for afib. So far EKG and tele showed no afib. Discussed with EP will pursue 30 day cardiac event monitoring first given frequent symptoms.   Diet - Heart healthy with thin liquids.  No  antithrombotic prior to admission, now on aspirin 81 mg daily and plavix. Recommend DAPT for 3 weeks and then plavix alone  Patient counseled to be compliant with his antithrombotic medications  Ongoing aggressive stroke risk factor management  Therapy recommendations:  pending  Disposition:  Pending  Intermittent heart palpitation  Periodically during day or night in bed  Highly suspicious for afib  Discussed with EP NP Renee, will recommend 30 day cardiac event monitoring as outpt ASAP. If negative, will consider loop recorder.   Hypertension  Stable . Permissive hypertension (OK if < 220/120) but gradually normalize in 5-7 days . Long-term BP goal normotensive  Hyperlipidemia  Lipid lowering medication PTA: none  LDL 113, goal < 70  Current lipid lowering medication: Lipitor 20 mg daily  Continue statin at discharge   Other Stroke Risk Factors  Advanced age  ETOH use, advised to drink no more than 1 alcoholic beverage per day.  Obesity, Body mass index is 37.17 kg/m., recommend weight loss, diet and exercise as appropriate   Hx stroke/TIA - by imaging - right BG/CR lacunar infarct  Other Active  Problems  Right lateral ventricle subependymoma - asymptomatic - recommend NSG out pt follow up.   Remote hx of rectal bleeding - likely hemorrhoids.    Hospital day # 0  Neurology will sign off. Please call with questions. Pt will follow up with stroke clinic NP at [facility] in about 4 weeks. Thanks for the consult.  Rosalin Hawking, MD PhD Stroke Neurology 01/30/2018 12:26 PM    To contact Stroke Continuity provider, please refer to [email or web address] After hours, contact General Neurology

## 2018-01-30 NOTE — Evaluation (Signed)
Occupational Therapy Evaluation and Discharge Patient Details Name: [name] MRN: [id number] DOB: 1945-10-05 Today's Date: 01/30/2018    History of Present Illness Pt is a 72 y.o. M with significant PMH of HTN presenting with expressive aphasia. 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    Clinical Impression   This 72 yo male admitted with above presents to acute OT at Mod I to independent level, no further OT needs, we will sign off.     Follow Up Recommendations  No OT follow up    Equipment Recommendations  None recommended by OT       Precautions / Restrictions Precautions Precautions: None Restrictions Weight Bearing Restrictions: No      Mobility Bed Mobility Overal bed mobility: Independent                Transfers Overall transfer level: Independent                    Balance Overall balance assessment: Independent                                         ADL either performed or assessed with clinical judgement   ADL Overall ADL's : Modified independent                                       General ADL Comments: increased time for LB ADLs (pt reports this is baseline)     Vision Baseline Vision/History: Wears glasses Wears Glasses: Reading only Patient Visual Report: No change from baseline Vision Assessment?: Yes Eye Alignment: Within Functional Limits Ocular Range of Motion: Within Functional Limits Alignment/Gaze Preference: Within Defined Limits Tracking/Visual Pursuits: Able to track stimulus in all quads without difficulty Saccades: Within functional limits Convergence: Within functional limits Visual Fields: No apparent deficits            Pertinent Vitals/Pain Pain Assessment: No/denies pain     Hand Dominance Right   Extremity/Trunk Assessment Upper Extremity  Assessment Upper Extremity Assessment: Overall WFL for tasks assessed           Communication Communication Communication: Expressive difficulties(with conversation, but reads well)   Cognition Arousal/Alertness: Awake/alert Behavior During Therapy: Flat affect Overall Cognitive Status: Within Functional Limits for tasks assessed                                 General Comments: Pt did have probelms with word finding in conversation with him. He was able to read a paragraph without any issues. Followed all commands without issues              Home Living Family/patient expects to be discharged to:: Private residence Living Arrangements: Spouse/significant other Available Help at Discharge: Family;Available PRN/intermittently Type of Home: House Home Access: Stairs to enter [employer] of Steps: 4 Entrance Stairs-Rails: Right Home Layout: Two level;Able to live on main level with bedroom/bathroom     Bathroom Shower/Tub: Tub/shower unit;Curtain   Bathroom Toilet: Handicapped height         Additional Comments: wife works for [employer]      Prior Functioning/Environment Level  of Independence: Independent with assistive device(s)        Comments: uses cane secondary to chronic knee issues                 OT Goals(Current goals can be found in the care plan section) Acute Rehab OT Goals Patient Stated Goal: none stated  OT Frequency:                AM-PAC PT "6 Clicks" Daily Activity     Outcome Measure Help from another person eating meals?: None Help from another person taking care of personal grooming?: None Help from another person toileting, which includes using toliet, bedpan, or urinal?: None Help from another person bathing (including washing, rinsing, drying)?: None Help from another person to put on and taking off regular upper body clothing?: None Help from another person to put on and taking off  regular lower body clothing?: None 6 Click Score: 24   End of Session Equipment Utilized During Treatment: (none)  Activity Tolerance: Patient tolerated treatment well Patient left: in bed;with call bell/phone within reach;with bed alarm set                   Time: 4098-1191 OT Time Calculation (min): 18 min Charges:  OT General Charges $OT Visit: 1 Visit OT Evaluation $OT Eval Moderate Complexity: 1 Mod  [address], OTR/L Acute [facility] [phone] Office [phone]

## 2018-01-30 NOTE — Progress Notes (Signed)
*  Preliminary Results* Bilateral lower extremity venous duplex completed. Bilateral lower extremities are negative for deep vein thrombosis. There is no evidence of Baker's cyst bilaterally.  01/30/2018 10:28 AM Maudry Mayhew, MHA, RVT, RDCS, RDMS

## 2018-01-30 NOTE — Discharge Summary (Signed)
Physician Discharge Summary  [name] YHC:[id number] DOB: [birth year] DOA: 01/29/2018  PCP: Leonides Sake, MD  Admit date: 01/29/2018 Discharge date: 01/30/2018  Admitted From: home Discharge disposition: home   Recommendations for Outpatient Follow-Up:   1. Referral made for 30 day event monitor 2. 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 Monitor LFTs (has held lamisil for now)   Discharge Diagnosis:   Principal Problem:   CVA (cerebral vascular accident) [facility]) Active Problems:   Brain tumor ([place])   Essential hypertension   BPH (benign prostatic hyperplasia)    Discharge Condition: Improved.  Diet recommendation: Low sodium, heart healthy  Wound care: None.  Code status: Full.   History of Present Illness:   [name] is a 72 y.o. male with medical history significant of HTN presenting with expressive aphasia. Yesterday, about 3PM, he was bringing a rug into the house.  He was trying to describe to his neighbor where he wanted and couldn't get the words out.  He went to the doctor this morning and he couldn't get the words out.  He can respond to questions but just can't think of what to say.  Yesterday, he was unable to say anything, but today he is able to describe this situation.  He was also hot, sweaty, nauseated yesterday and then had some hot/cold changes, didn't sleep very well.  He has had headaches with a feeling like he is going to pass out with disorientation x 3 weeks.  ?Chronic anisocoria - eye doctor mentioned it during a prior visit.  Occasional vision problems, not changed.  Occasional dysphagia, has to take small bites at a time, but this is unchanged.  No numbness/weakness/tingling.    Hospital Course by Problem:   Stroke:  Small acute Lt MCA frontal cortical infarct - embolic - highly suspicious for afib giving intermittent heart palpitation symptoms.  Resultant  baseline  CT head - chronic RIGHT basal  ganglia infarct.Equivocal acute to subacute infarct  B LE dopplers - no DVT  MRI head - Small acute Lt MCA infarcts frontal lobe. 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   MRA head - normal  CTA H&N - No intracranial or extracranial stenosis or occlusion of significance.  2D Echo 11/21/2017 - EF 65 - 70%. No cardiac source of emboli identified. AV stenosis.   LDL - 113  HgbA1c - 6.5  Pt endorsed intermittent heart palpitation, highly concern for afib. So far EKG and tele showed no afib. Discussed with EP will pursue 30 day cardiac event monitoring first given frequent symptoms.   Neuro: Recommend DAPT for 3 weeks and then plavix alone   Intermittent heart palpitation  Periodically during day or night in bed  Highly suspicious for afib  30 day cardiac event monitoring as outpt ASAP. If negative, will consider loop recorder.   Hypertension  Stable  Permissive hypertension (OK if < 220/120) but gradually normalize in 5-7 days  Long-term BP goal normotensive  Hyperlipidemia  LDL 113, goal < 70  Current lipid lowering medication: Lipitor 20 mg daily      Medical Consultants:   neuro   Discharge Exam:   Vitals:   01/30/18 0834 01/30/18 1053  BP: 134/75 137/72  Pulse: 73 72  Resp: 16 16  Temp: 98.1 F (36.7 C) 98.3 F (36.8 C)  SpO2: 94% 93%   Vitals:   01/30/18 0834 01/30/18 0945 01/30/18 1053 01/30/18 1117  BP: 134/75  137/72  Pulse: 73  72   Resp: 16  16   Temp: 98.1 F (36.7 C)  98.3 F (36.8 C)   TempSrc: Oral  Oral   SpO2: 94%  93%   Weight:  117.5 kg    Height:    5\' 10"  (1.778 m)    General exam: Appears calm and comfortable.    The results of significant diagnostics from this hospitalization (including imaging, microbiology, ancillary and laboratory) are listed below for reference.     Procedures and Diagnostic Studies:   Ct Angio Head W Or Wo Contrast  Result Date: 01/29/2018 CLINICAL DATA:  Expressive  aphasia, intermittent frontal headache, last seen well 1400 hours 01/28/2018. EXAM: CT ANGIOGRAPHY HEAD AND NECK TECHNIQUE: Multidetector CT imaging of the head and neck was performed using the standard protocol during bolus administration of intravenous contrast. Multiplanar CT image reconstructions and MIPs were obtained to evaluate the vascular anatomy. Carotid stenosis measurements (when applicable) are obtained utilizing NASCET criteria, using the distal internal carotid diameter as the denominator. CONTRAST:  58mL ISOVUE-370 IOPAMIDOL (ISOVUE-370) INJECTION 76% COMPARISON:  None. FINDINGS: CT HEAD FINDINGS Brain: Indeterminate age hypodensity LEFT frontal operculum, see coronal image 36 and sagittal image 50, could represent an acute or subacute infarct. Generalized atrophy with hypoattenuation of white matter consistent with small vessel disease. Old RIGHT basal ganglia infarct. 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 Asymmetric choroid plexus less likely. The abnormality does not enhance postcontrast. No hemorrhage, mass lesion, hydrocephalus, or extra-axial fluid. Vascular: Reported separately Skull: Unremarkable Sinuses: BILATERAL maxillary sinus retention cysts. Orbits: Dense lenticular opacities.  No acute finding. Review of the MIP images confirms the above findings CTA NECK FINDINGS Aortic arch: Standard branching. Imaged portion shows no evidence of aneurysm or dissection. No significant stenosis of the major arch vessel origins. Right carotid system: No evidence of dissection, stenosis (50% or greater) or occlusion. Left carotid system: No evidence of dissection, stenosis (50% or greater) or occlusion. Vertebral arteries: Codominant. No evidence of dissection, stenosis (50% or greater) or occlusion. Skeleton: Spondylosis.  Unremarkable appearing C4-C6 ACDF. Other neck: No masses. Upper chest: No pneumothorax or infiltrate.  Review of the MIP images confirms the above findings CTA HEAD FINDINGS Anterior circulation: No significant stenosis, proximal occlusion, aneurysm, or vascular malformation. Minimal calcific atheromatous change BILATERAL carotid siphons. Posterior circulation: No significant stenosis, proximal occlusion, aneurysm, or vascular malformation. Venous sinuses: As permitted by contrast timing, patent. Anatomic variants: None of significance. Delayed phase: No abnormal postcontrast enhancement. Review of the MIP images confirms the above findings IMPRESSION: No intracranial or extracranial stenosis or occlusion of significance. Chronic RIGHT basal ganglia infarct. Equivocal acute to subacute infarct, LEFT frontal operculum area of hypoattenuation. If no contraindications, MRI could provide additional information. 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 Electronically Signed   By: Staci Righter M.D.   On: 01/29/2018 13:24   Ct Angio Neck W And/or Wo Contrast  Result Date: 01/29/2018 CLINICAL DATA:  Expressive aphasia, intermittent frontal headache, last seen well 1400 hours 01/28/2018. EXAM: CT ANGIOGRAPHY HEAD AND NECK TECHNIQUE: Multidetector CT imaging of the head and neck was performed using the standard protocol during bolus administration of intravenous contrast. Multiplanar CT image reconstructions and MIPs were obtained to evaluate the vascular anatomy. Carotid stenosis measurements (when applicable) are obtained utilizing NASCET criteria, using the distal internal carotid diameter as the denominator. CONTRAST:  41mL ISOVUE-370 IOPAMIDOL (ISOVUE-370) INJECTION 76% COMPARISON:  None. FINDINGS: CT HEAD FINDINGS Brain: Indeterminate age hypodensity LEFT frontal operculum, see coronal image 36 and sagittal image 50, could represent an acute or subacute infarct. Generalized atrophy with hypoattenuation of white matter consistent with small vessel disease. Old RIGHT basal ganglia infarct.  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 Asymmetric choroid plexus less likely. The abnormality does not enhance postcontrast. No hemorrhage, mass lesion, hydrocephalus, or extra-axial fluid. Vascular: Reported separately Skull: Unremarkable Sinuses: BILATERAL maxillary sinus retention cysts. Orbits: Dense lenticular opacities.  No acute finding. Review of the MIP images confirms the above findings CTA NECK FINDINGS Aortic arch: Standard branching. Imaged portion shows no evidence of aneurysm or dissection. No significant stenosis of the major arch vessel origins. Right carotid system: No evidence of dissection, stenosis (50% or greater) or occlusion. Left carotid system: No evidence of dissection, stenosis (50% or greater) or occlusion. Vertebral arteries: Codominant. No evidence of dissection, stenosis (50% or greater) or occlusion. Skeleton: Spondylosis.  Unremarkable appearing C4-C6 ACDF. Other neck: No masses. Upper chest: No pneumothorax or infiltrate. Review of the MIP images confirms the above findings CTA HEAD FINDINGS Anterior circulation: No significant stenosis, proximal occlusion, aneurysm, or vascular malformation. Minimal calcific atheromatous change BILATERAL carotid siphons. Posterior circulation: No significant stenosis, proximal occlusion, aneurysm, or vascular malformation. Venous sinuses: As permitted by contrast timing, patent. Anatomic variants: None of significance. Delayed phase: No abnormal postcontrast enhancement. Review of the MIP images confirms the above findings IMPRESSION: No intracranial or extracranial stenosis or occlusion of significance. Chronic RIGHT basal ganglia infarct. Equivocal acute to subacute infarct, LEFT frontal operculum area of hypoattenuation. If no contraindications, MRI could provide additional information. 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  Electronically Signed   By: Staci Righter M.D.   On: 01/29/2018 13:24   Mr Brain Wo Contrast  Result Date: 01/29/2018 CLINICAL DATA:  Expressive aphasia, intermittent dizziness, and intermittent headaches. EXAM: MRI HEAD WITHOUT CONTRAST TECHNIQUE: Multiplanar, multiecho pulse sequences of the brain and surrounding structures were obtained without intravenous contrast. COMPARISON:  Head CT/CTA 01/29/2018 FINDINGS: Brain: A 1.5 cm acute cortically based infarct is noted in the left frontal operculum. Multiple additional smaller acute infarcts are noted more superiorly and left frontal white matter. No intracranial hemorrhage, midline shift, or extra-axial fluid collection is present. There is a chronic right basal ganglia infarct. T2 hyperintensities in the cerebral white matter bilaterally and in the pons are nonspecific but compatible with mild chronic small vessel ischemic disease. A small chronic infarct is noted in the left paramedian pons. There is mild cerebral atrophy. There is a 12 mm T2/FLAIR hyperintense mass in the body of the right lateral ventricle which did not appreciably enhance on today's earlier CT. Vascular: Major intracranial vascular flow voids are preserved. Skull and upper cervical spine: Unremarkable bone marrow signal. Susceptibility artifact at C4 related to anterior fusion. C3-4 disc degeneration. Sinuses/Orbits: Unremarkable orbits. Mild right maxillary sinus mucosal thickening. Left maxillary sinus mucous retention cyst. Clear mastoid air cells. Other: None. IMPRESSION: 1. Small acute left MCA infarcts in the frontal lobe. 2. Mild chronic small vessel ischemic disease. 3. 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 Electronically Signed   By: Logan Bores M.D.   On: 01/29/2018 17:04   Mr Brain W Contrast  Result Date: 01/29/2018 CLINICAL DATA:  Blurry vision and word-finding difficulties. Headaches. EXAM: MRI HEAD WITH CONTRAST MRA HEAD WITHOUT CONTRAST TECHNIQUE:  Multiplanar, multiecho pulse  sequences of the brain and surrounding structures were obtained with intravenous contrast. Angiographic images of the head were obtained using MRA technique without contrast. CONTRAST:  10 mL Gadavist COMPARISON:  Brain MRI without 01/29/2018 FINDINGS: MRI HEAD FINDINGS There is no contrast enhancement of the intraventricular mass located in the right lateral ventricle. No abnormal contrast enhancement elsewhere within the brain. Old right basal ganglia infarct. MRA HEAD FINDINGS Intracranial internal carotid arteries: Normal. Anterior cerebral arteries: Normal. Middle cerebral arteries: Normal. Posterior communicating arteries: Absent bilaterally. Posterior cerebral arteries: Normal. Basilar artery: Normal. Vertebral arteries: Left dominant. Normal. Superior cerebellar arteries: Normal. Inferior cerebellar arteries: Normal. IMPRESSION: 1. No contrast enhancement at the right lateral intraventricular mass, typical of subependymoma. 2. Normal intracranial MRA. Electronically Signed   By: Ulyses Jarred M.D.   On: 01/29/2018 19:19   Mr Jodene Nam Head Wo Contrast  Result Date: 01/29/2018 CLINICAL DATA:  Blurry vision and word-finding difficulties. Headaches. EXAM: MRI HEAD WITH CONTRAST MRA HEAD WITHOUT CONTRAST TECHNIQUE: Multiplanar, multiecho pulse sequences of the brain and surrounding structures were obtained with intravenous contrast. Angiographic images of the head were obtained using MRA technique without contrast. CONTRAST:  10 mL Gadavist COMPARISON:  Brain MRI without 01/29/2018 FINDINGS: MRI HEAD FINDINGS There is no contrast enhancement of the intraventricular mass located in the right lateral ventricle. No abnormal contrast enhancement elsewhere within the brain. Old right basal ganglia infarct. MRA HEAD FINDINGS Intracranial internal carotid arteries: Normal. Anterior cerebral arteries: Normal. Middle cerebral arteries: Normal. Posterior communicating arteries: Absent  bilaterally. Posterior cerebral arteries: Normal. Basilar artery: Normal. Vertebral arteries: Left dominant. Normal. Superior cerebellar arteries: Normal. Inferior cerebellar arteries: Normal. IMPRESSION: 1. No contrast enhancement at the right lateral intraventricular mass, typical of subependymoma. 2. Normal intracranial MRA. Electronically Signed   By: Ulyses Jarred M.D.   On: 01/29/2018 19:19     Labs:   Basic Metabolic Panel: Recent Labs  Lab 01/29/18 1220 01/29/18 1230  NA 140 138  K 4.6 4.3  CL 99 99  CO2 29  --   GLUCOSE 160* 159*  BUN 9 11  CREATININE 1.11 1.10  CALCIUM 9.6  --    GFR Estimated Creatinine Clearance: 79.1 mL/min (by C-G formula based on SCr of 1.1 mg/dL). Liver Function Tests: Recent Labs  Lab 01/29/18 1220  AST 23  ALT 15  ALKPHOS 63  BILITOT 1.1  PROT 7.0  ALBUMIN 3.7   No results for input(s): LIPASE, AMYLASE in the last 168 hours. No results for input(s): AMMONIA in the last 168 hours. Coagulation profile Recent Labs  Lab 01/29/18 1220  INR 1.03    CBC: Recent Labs  Lab 01/29/18 1220 01/29/18 1230  WBC 9.4  --   NEUTROABS 6.1  --   HGB 17.0 18.0*  HCT 53.5* 53.0*  MCV 97.3  --   PLT 201  --    Cardiac Enzymes: No results for input(s): CKTOTAL, CKMB, CKMBINDEX, TROPONINI in the last 168 hours. BNP: Invalid input(s): POCBNP CBG: Recent Labs  Lab 01/29/18 1222 01/30/18 0924  GLUCAP 148* 155*   D-Dimer No results for input(s): DDIMER in the last 72 hours. Hgb A1c Recent Labs    01/30/18 0347  HGBA1C 6.5*   Lipid Profile Recent Labs    01/30/18 0347  CHOL 207*  HDL 30*  LDLCALC 113*  TRIG 321*  CHOLHDL 6.9   Thyroid function studies Recent Labs    01/29/18 2056  TSH 0.503   Anemia work up No results for input(s): VITAMINB12,  FOLATE, FERRITIN, TIBC, IRON, RETICCTPCT in the last 72 hours. Microbiology No results found for this or any previous visit (from the past 240 hour(s)).   Discharge  Instructions:   Discharge Instructions    Ambulatory referral to Neurology   Complete by:  As directed    Follow up with stroke clinic NP (Kiah Keay Vanschaick or Cecille Rubin, if both not available, consider Zachery Dauer, or [place]) at [facility] in about 4 weeks. Thanks.   Diet - low sodium heart healthy   Complete by:  As directed    Discharge instructions   Complete by:  As directed    Asa plus plavix for 3 weeks then plavix alone Outpatient 30 day event monitor for 30 days   Increase activity slowly   Complete by:  As directed      Allergies as of 01/30/2018   No Known Allergies     Medication List    STOP taking these medications   ibuprofen 200 MG tablet Commonly known as:  ADVIL,MOTRIN     TAKE these medications   aspirin EC 81 MG tablet Take 81 mg by mouth once.   atorvastatin 20 MG tablet Commonly known as:  LIPITOR Take 1 tablet (20 mg total) by mouth daily at 6 PM.   clopidogrel 75 MG tablet Commonly known as:  PLAVIX Take 1 tablet (75 mg total) by mouth daily. Start taking on:  01/31/2018   doxazosin 4 MG tablet Commonly known as:  CARDURA Take 4 mg by mouth at bedtime.   dutasteride 0.5 MG capsule Commonly known as:  AVODART Take 0.5 mg by mouth daily.   finasteride 5 MG tablet Commonly known as:  PROSCAR Take 5 mg by mouth daily.   HYDROcodone-acetaminophen 5-325 MG tablet Commonly known as:  NORCO/VICODIN Take 1 tablet by mouth daily as needed (for pain).   hydrocortisone 25 MG suppository Commonly known as:  ANUSOL-HC Place 25 mg rectally See admin instructions. UNWRAP AND INSERT 1 SUPPOSITORY RECTALLY TWICE DAILY AS NEEDED FOR HEMORRHOID SYMPTOMS   losartan 25 MG tablet Commonly known as:  COZAAR Take 25 mg by mouth daily.   PREPARATION H 1 % Generic drug:  hydrocortisone cream Apply 1 application topically 2 (two) times daily as needed for itching.   terbinafine 250 MG tablet Commonly known as:  LAMISIL Take 250 mg by mouth daily.       Follow-up Information    [facility]. Schedule an appointment as soon as possible for a visit in 4 week(s).   Specialty:  Neurology Contact information: [address] [place] [phone]       Hamrick, Lorin Mercy, MD Follow up in 1 week(s).   Specialty:  Family Medicine Contact information: [place] [address] [phone]            Time coordinating discharge: 25 min  Signed:  Geradine Girt  [facility] 01/30/2018, 1:41 PM

## 2018-01-30 NOTE — Progress Notes (Signed)
Reviewed stroke recovery book with patient and wife.

## 2018-02-01 ENCOUNTER — Inpatient Hospital Stay (HOSPITAL_COMMUNITY)
Admission: EM | Admit: 2018-02-01 | Discharge: 2018-02-04 | DRG: 066 | Disposition: A | Discharge status: Home Health | Payer: Medicare Other | Attending: Internal Medicine | Admitting: Internal Medicine

## 2018-02-01 ENCOUNTER — Encounter (HOSPITAL_COMMUNITY): Payer: Self-pay

## 2018-02-01 ENCOUNTER — Emergency Department (HOSPITAL_COMMUNITY): Payer: Medicare Other

## 2018-02-01 ENCOUNTER — Inpatient Hospital Stay (HOSPITAL_COMMUNITY): Payer: Medicare Other

## 2018-02-01 ENCOUNTER — Other Ambulatory Visit: Payer: Self-pay

## 2018-02-01 DIAGNOSIS — Z7902 Long term (current) use of antithrombotics/antiplatelets: Secondary | ICD-10-CM | POA: Diagnosis not present

## 2018-02-01 DIAGNOSIS — R471 Dysarthria and anarthria: Secondary | ICD-10-CM | POA: Diagnosis present

## 2018-02-01 DIAGNOSIS — N400 Enlarged prostate without lower urinary tract symptoms: Secondary | ICD-10-CM | POA: Diagnosis present

## 2018-02-01 DIAGNOSIS — R4182 Altered mental status, unspecified: Secondary | ICD-10-CM

## 2018-02-01 DIAGNOSIS — Z66 Do not resuscitate: Secondary | ICD-10-CM | POA: Diagnosis present

## 2018-02-01 DIAGNOSIS — E785 Hyperlipidemia, unspecified: Secondary | ICD-10-CM | POA: Diagnosis present

## 2018-02-01 DIAGNOSIS — Z6837 Body mass index (BMI) 37.0-37.9, adult: Secondary | ICD-10-CM | POA: Diagnosis not present

## 2018-02-01 DIAGNOSIS — E119 Type 2 diabetes mellitus without complications: Secondary | ICD-10-CM | POA: Diagnosis present

## 2018-02-01 DIAGNOSIS — Z9049 Acquired absence of other specified parts of digestive tract: Secondary | ICD-10-CM | POA: Diagnosis not present

## 2018-02-01 DIAGNOSIS — I10 Essential (primary) hypertension: Secondary | ICD-10-CM | POA: Diagnosis present

## 2018-02-01 DIAGNOSIS — Z7982 Long term (current) use of aspirin: Secondary | ICD-10-CM

## 2018-02-01 DIAGNOSIS — I6300 Cerebral infarction due to thrombosis of unspecified precerebral artery: Secondary | ICD-10-CM

## 2018-02-01 DIAGNOSIS — I351 Nonrheumatic aortic (valve) insufficiency: Secondary | ICD-10-CM | POA: Diagnosis not present

## 2018-02-01 DIAGNOSIS — Z8249 Family history of ischemic heart disease and other diseases of the circulatory system: Secondary | ICD-10-CM | POA: Diagnosis not present

## 2018-02-01 DIAGNOSIS — D496 Neoplasm of unspecified behavior of brain: Secondary | ICD-10-CM | POA: Diagnosis present

## 2018-02-01 DIAGNOSIS — I350 Nonrheumatic aortic (valve) stenosis: Secondary | ICD-10-CM | POA: Diagnosis present

## 2018-02-01 DIAGNOSIS — R0609 Other forms of dyspnea: Secondary | ICD-10-CM | POA: Diagnosis not present

## 2018-02-01 DIAGNOSIS — R4701 Aphasia: Secondary | ICD-10-CM | POA: Diagnosis not present

## 2018-02-01 DIAGNOSIS — R29701 NIHSS score 1: Secondary | ICD-10-CM | POA: Diagnosis present

## 2018-02-01 DIAGNOSIS — R569 Unspecified convulsions: Secondary | ICD-10-CM | POA: Diagnosis not present

## 2018-02-01 DIAGNOSIS — R29810 Facial weakness: Secondary | ICD-10-CM | POA: Diagnosis present

## 2018-02-01 DIAGNOSIS — I63412 Cerebral infarction due to embolism of left middle cerebral artery: Principal | ICD-10-CM | POA: Diagnosis present

## 2018-02-01 DIAGNOSIS — Z803 Family history of malignant neoplasm of breast: Secondary | ICD-10-CM | POA: Diagnosis not present

## 2018-02-01 DIAGNOSIS — I4891 Unspecified atrial fibrillation: Secondary | ICD-10-CM | POA: Diagnosis present

## 2018-02-01 DIAGNOSIS — R251 Tremor, unspecified: Secondary | ICD-10-CM | POA: Diagnosis present

## 2018-02-01 DIAGNOSIS — I6302 Cerebral infarction due to thrombosis of basilar artery: Secondary | ICD-10-CM

## 2018-02-01 DIAGNOSIS — I208 Other forms of angina pectoris: Secondary | ICD-10-CM | POA: Diagnosis not present

## 2018-02-01 DIAGNOSIS — E669 Obesity, unspecified: Secondary | ICD-10-CM | POA: Diagnosis present

## 2018-02-01 DIAGNOSIS — I639 Cerebral infarction, unspecified: Secondary | ICD-10-CM | POA: Diagnosis present

## 2018-02-01 HISTORY — DX: Cardiac murmur, unspecified: R01.1

## 2018-02-01 HISTORY — DX: Cerebral infarction, unspecified: I63.9

## 2018-02-01 HISTORY — DX: Basal cell carcinoma of skin, unspecified: C44.91

## 2018-02-01 HISTORY — DX: Dependence on other enabling machines and devices: Z99.89

## 2018-02-01 HISTORY — DX: Obstructive sleep apnea (adult) (pediatric): G47.33

## 2018-02-01 HISTORY — DX: Low back pain, unspecified: M54.50

## 2018-02-01 HISTORY — DX: Unspecified osteoarthritis, unspecified site: M19.90

## 2018-02-01 HISTORY — DX: Other chronic pain: G89.29

## 2018-02-01 HISTORY — DX: Low back pain: M54.5

## 2018-02-01 HISTORY — DX: Pure hypercholesterolemia, unspecified: E78.00

## 2018-02-01 LAB — DIFFERENTIAL
Abs Immature Granulocytes: 0.1 10*3/uL (ref 0.0–0.1)
BASOS ABS: 0.1 10*3/uL (ref 0.0–0.1)
BASOS PCT: 1 %
Eosinophils Absolute: 0.1 10*3/uL (ref 0.0–0.7)
Eosinophils Relative: 1 %
IMMATURE GRANULOCYTES: 1 %
Lymphocytes Relative: 28 %
Lymphs Abs: 2.5 10*3/uL (ref 0.7–4.0)
Monocytes Absolute: 0.8 10*3/uL (ref 0.1–1.0)
Monocytes Relative: 9 %
NEUTROS PCT: 60 %
Neutro Abs: 5.4 10*3/uL (ref 1.7–7.7)

## 2018-02-01 LAB — I-STAT CHEM 8, ED
BUN: 13 mg/dL (ref 8–23)
CALCIUM ION: 1.07 mmol/L — AB (ref 1.15–1.40)
CREATININE: 1 mg/dL (ref 0.61–1.24)
Chloride: 102 mmol/L (ref 98–111)
GLUCOSE: 139 mg/dL — AB (ref 70–99)
HCT: 49 % (ref 39.0–52.0)
HEMOGLOBIN: 16.7 g/dL (ref 13.0–17.0)
Potassium: 4 mmol/L (ref 3.5–5.1)
Sodium: 138 mmol/L (ref 135–145)
TCO2: 27 mmol/L (ref 22–32)

## 2018-02-01 LAB — COMPREHENSIVE METABOLIC PANEL
ALBUMIN: 3.4 g/dL — AB (ref 3.5–5.0)
ALT: 21 U/L (ref 0–44)
AST: 23 U/L (ref 15–41)
Alkaline Phosphatase: 51 U/L (ref 38–126)
Anion gap: 9 (ref 5–15)
BUN: 11 mg/dL (ref 8–23)
CALCIUM: 8.8 mg/dL — AB (ref 8.9–10.3)
CHLORIDE: 103 mmol/L (ref 98–111)
CO2: 26 mmol/L (ref 22–32)
Creatinine, Ser: 1.12 mg/dL (ref 0.61–1.24)
GFR calc Af Amer: >60 mL/min (ref >60)
GFR calc non Af Amer: >60 mL/min (ref >60)
GLUCOSE: 141 mg/dL — AB (ref 70–99)
Potassium: 4.1 mmol/L (ref 3.5–5.1)
SODIUM: 138 mmol/L (ref 135–145)
Total Bilirubin: 0.8 mg/dL (ref 0.3–1.2)
Total Protein: 6.6 g/dL (ref 6.5–8.1)

## 2018-02-01 LAB — CBC
HEMATOCRIT: 50.5 % (ref 39.0–52.0)
HEMOGLOBIN: 16.1 g/dL (ref 13.0–17.0)
MCH: 31.1 pg (ref 26.0–34.0)
MCHC: 31.9 g/dL (ref 30.0–36.0)
MCV: 97.5 fL (ref 78.0–100.0)
Platelets: 176 10*3/uL (ref 150–400)
RBC: 5.18 MIL/uL (ref 4.22–5.81)
RDW: 12.5 % (ref 11.5–15.5)
WBC: 8.9 10*3/uL (ref 4.0–10.5)

## 2018-02-01 LAB — PROTIME-INR
INR: 1
Prothrombin Time: 13.1 seconds (ref 11.4–15.2)

## 2018-02-01 LAB — I-STAT TROPONIN, ED: Troponin i, poc: 0 ng/mL (ref 0.00–0.08)

## 2018-02-01 LAB — ETHANOL

## 2018-02-01 LAB — APTT: APTT: 32 s (ref 24–36)

## 2018-02-01 MED ORDER — STROKE: EARLY STAGES OF RECOVERY BOOK
Freq: Once | Status: AC
  Administered 2018-02-01: 19:00:00
  Filled 2018-02-01: qty 1

## 2018-02-01 MED ORDER — HYDROCODONE-ACETAMINOPHEN 5-325 MG PO TABS: 1.0000 | ORAL_TABLET | Freq: Every day | ORAL | Status: DC | PRN

## 2018-02-01 MED ORDER — ACETAMINOPHEN 325 MG PO TABS: 650.0000 mg | ORAL_TABLET | ORAL | Status: DC | PRN

## 2018-02-01 MED ORDER — ACETAMINOPHEN 650 MG RE SUPP: 650.0000 mg | RECTAL | Status: DC | PRN

## 2018-02-01 MED ORDER — FINASTERIDE 5 MG PO TABS: 5.0000 mg | ORAL_TABLET | Freq: Every day | ORAL | Status: DC

## 2018-02-01 MED ORDER — DUTASTERIDE 0.5 MG PO CAPS
0.5000 mg | ORAL_CAPSULE | Freq: Every day | ORAL | Status: DC
  Administered 2018-02-02 – 2018-02-04 (×3): 0.5 mg via ORAL
  Filled 2018-02-01 (×3): qty 1

## 2018-02-01 MED ORDER — ACETAMINOPHEN 160 MG/5ML PO SOLN: 650.0000 mg | ORAL | Status: DC | PRN

## 2018-02-01 MED ORDER — ASPIRIN EC 81 MG PO TBEC
81.0000 mg | DELAYED_RELEASE_TABLET | Freq: Every day | ORAL | Status: DC
  Administered 2018-02-02 – 2018-02-04 (×3): 81 mg via ORAL
  Filled 2018-02-01 (×3): qty 1

## 2018-02-01 MED ORDER — LEVETIRACETAM 500 MG PO TABS
500.0000 mg | ORAL_TABLET | Freq: Two times a day (BID) | ORAL | Status: DC
  Administered 2018-02-02 – 2018-02-04 (×6): 500 mg via ORAL
  Filled 2018-02-01 (×7): qty 1

## 2018-02-01 MED ORDER — LEVETIRACETAM IN NACL 1000 MG/100ML IV SOLN
1000.0000 mg | Freq: Once | INTRAVENOUS | Status: AC
  Administered 2018-02-01: 1000 mg via INTRAVENOUS
  Filled 2018-02-01: qty 100

## 2018-02-01 MED ORDER — SENNOSIDES-DOCUSATE SODIUM 8.6-50 MG PO TABS: 1.0000 | ORAL_TABLET | Freq: Every evening | ORAL | Status: DC | PRN

## 2018-02-01 MED ORDER — SODIUM CHLORIDE 0.9 % IV SOLN
INTRAVENOUS | Status: DC
  Administered 2018-02-01: 16:00:00 via INTRAVENOUS

## 2018-02-01 MED ORDER — ATORVASTATIN CALCIUM 20 MG PO TABS
20.0000 mg | ORAL_TABLET | Freq: Every day | ORAL | Status: DC
  Administered 2018-02-01: 20 mg via ORAL
  Filled 2018-02-01: qty 1
  Filled 2018-02-01: qty 2

## 2018-02-01 MED ORDER — ENOXAPARIN SODIUM 40 MG/0.4ML ~~LOC~~ SOLN
40.0000 mg | SUBCUTANEOUS | Status: DC
  Administered 2018-02-01 – 2018-02-04 (×4): 40 mg via SUBCUTANEOUS
  Filled 2018-02-01 (×4): qty 0.4

## 2018-02-01 MED ORDER — CLOPIDOGREL BISULFATE 75 MG PO TABS
75.0000 mg | ORAL_TABLET | Freq: Every day | ORAL | Status: DC
  Administered 2018-02-02 – 2018-02-04 (×3): 75 mg via ORAL
  Filled 2018-02-01 (×4): qty 1

## 2018-02-01 NOTE — H&P (Signed)
History and Physical    [name] WCH:[id number] DOB: 08-01-1945 DOA: 02/01/2018  PCP: Isaias Sakai, DO Consultants:  Neuro Patient coming from: home  Chief Complaint: R facial droop, aphasia  HPI: [name] is a 72 y.o. male with medical history significant for HTN, CVA 01/29/2018 (just discharged 9/27), who presented to the ED today with c/o R facial droop (pt and family describe it as more of a spasm), worsened expressive aphasia. Code stroke was called, then cancelled as symptoms had resolved by the time of arrival to the ED. However, while in room pt had a witnessed seizure. He was loaded on keppra. Pt is a difficult historian due to aphasia; history is supplemented by his wife and son.  Wife states that he wasn't back to baseline at the time of his discharge from the hospital on 9/27 as he still had significant expressive aphasia; however he had no residual weakness or other deficits. He has been on plavix and ASA since his admission. He woke up this morning and felt like his face had cramped up on the right side. His wife noticed he was having more trouble speaking. He reports that he does have occasional palpitations. Denies chest pain, SOB. Per wife he does have a pill-rolling-like tremor of his L hand that comes and goes. He has had no difficulty with ambulation.   ED Course: After code stroke was cancelled, pt had ~ 30 second period of R facial jerking with postictal stage, expressive aphasia returned. He did not have any incontinence. CT head showed new areas of hypoattenuation in Left frontal operculum, left frontal cortex and subcortical WM, c/w extension of previously documented acute L MCA infarct. No reperfusion hemorrhage.  Review of Systems: As per HPI; otherwise review of systems reviewed and negative.   Ambulatory Status:  Ambulates without assistance  Past Medical History:  Diagnosis Date  . CVA (cerebral vascular accident) ([place]) 01/29/2018  . Diarrhea     . Difficulty urinating   . Hypertension   . Rectal bleeding   . Rectal pain     Past Surgical History:  Procedure Laterality Date  . BACK SURGERY  1980's  . CHOLECYSTECTOMY  2005  . [place]  2011  . NECK SURGERY  2009  . NOSE SURGERY  2006    Social History   Socioeconomic History  . Marital status: Married    Spouse name: Not on file  . Number of children: Not on file  . Years of education: Not on file  . Highest education level: Not on file  Occupational History  . Occupation: retired  Scientific laboratory technician  . Financial resource strain: Not on file  . Food insecurity:    Worry: Not on file    Inability: Not on file  . Transportation needs:    Medical: Not on file    Non-medical: Not on file  Tobacco Use  . Smoking status: Never Smoker  . Smokeless tobacco: Never Used  Substance and Sexual Activity  . Alcohol use: Not Currently    Alcohol/week: 0.0 standard drinks    Comment: occ.  . Drug use: No  . Sexual activity: Not on file  Lifestyle  . Physical activity:    Days per week: Not on file    Minutes per session: Not on file  . Stress: Not on file  Relationships  . Social connections:    Talks on phone: Not on file    Gets together: Not on file  Attends religious service: Not on file    Active member of club or organization: Not on file    Attends meetings of clubs or organizations: Not on file    Relationship status: Not on file  . Intimate partner violence:    Fear of current or ex partner: Not on file    Emotionally abused: Not on file    Physically abused: Not on file    Forced sexual activity: Not on file  Other Topics Concern  . Not on file  Social History Narrative  . Not on file    No Known Allergies  Family History  Problem Relation Age of Onset  . Cancer Mother        breast/colon  . Heart disease Father   . Stroke Neg Hx     Prior to Admission medications   Medication Sig Start Date End Date Taking? Authorizing Provider   aspirin EC 81 MG tablet Take 81 mg by mouth once.   Yes [provider]  atorvastatin (LIPITOR) 20 MG tablet Take 1 tablet (20 mg total) by mouth daily at 6 PM. 01/30/18  Yes Eliseo Squires, Jessica U, DO  clopidogrel (PLAVIX) 75 MG tablet Take 1 tablet (75 mg total) by mouth daily. 01/31/18  Yes Eulogio Bear U, DO  doxazosin (CARDURA) 4 MG tablet Take 4 mg by mouth at bedtime.  11/11/13  Yes [provider]  dutasteride (AVODART) 0.5 MG capsule Take 0.5 mg by mouth daily. 12/29/17  Yes [provider]  finasteride (PROSCAR) 5 MG tablet Take 5 mg by mouth daily.  11/28/13  Yes [provider]  HYDROcodone-acetaminophen (NORCO/VICODIN) 5-325 MG tablet Take 1 tablet by mouth daily as needed (for pain).  12/26/17  Yes [provider]  hydrocortisone (ANUSOL-HC) 25 MG suppository Place 25 mg rectally See admin instructions. UNWRAP AND INSERT 1 SUPPOSITORY RECTALLY TWICE DAILY AS NEEDED FOR HEMORRHOID SYMPTOMS 01/20/18  Yes [provider]  hydrocortisone cream (PREPARATION H) 1 % Apply 1 application topically 2 (two) times daily as needed for itching.   Yes [provider]  losartan (COZAAR) 25 MG tablet Take 25 mg by mouth daily. 01/15/18  Yes [provider]    Physical Exam: Vitals:   02/01/18 1015 02/01/18 1030 02/01/18 1045  BP: 136/75 (!) 153/79 136/84  Pulse: 79 81 75  Resp: 20 (!) 21 (!) 22  SpO2: 94% 96% 93%     . General:  Appears calm and comfortable and is in NAD . Eyes:  PERRL, EOMI, normal lids, iris . ENT:  grossly normal hearing, lips & tongue, mmm; appropriate dentition . Neck:  no LAD, masses or thyromegaly; no carotid bruits . Cardiovascular:  normal rate, reg rhythm, no murmur. Marland Kitchen Respiratory:   CTA bilaterally with no wheezes/rales/rhonchi.  Normal respiratory effort. . Abdomen:  soft, NT, ND, NABS . Back:   grossly normal alignment, no CVAT . Skin:  no rash or induration seen on limited exam . Musculoskeletal:   grossly normal tone BUE/BLE, good ROM, no bony abnormality or obvious joint deformity . Lower extremity:  No LE edema.  Limited foot exam with no ulcerations.  2+ distal pulses. Marland Kitchen Psychiatric:  grossly normal mood and affect, speech fluent and appropriate, AOx3 . Neurologic: Expressive aphasia is present. CN 2-12 grossly intact, moves all extremities in coordinated fashion, sensation intact    Radiological Exams on Admission: Ct Head Code Stroke Wo Contrast  Result Date: 02/01/2018 CLINICAL DATA:  Code stroke. Awoke with increased RIGHT-sided  facial droop, and expressive aphasia. Atrial fibrillation. EXAM: CT HEAD WITHOUT CONTRAST TECHNIQUE: Contiguous axial images were obtained from the base of the skull through the vertex without intravenous contrast. COMPARISON:  CT head 01/29/2018. MR head 01/29/2018. CTA head neck 01/29/2018. FINDINGS: Brain: New areas of hypoattenuation involving the LEFT frontal operculum, LEFT frontal cortex, and subcortical white matter compared with CT and MR 3 days ago consistent with extension of the previous LEFT MCA territory infarct. No hemorrhage, intra-axial mass lesion, hydrocephalus, or extra-axial fluid. Unchanged RIGHT lateral ventricle subependymoma. Chronic RIGHT basal ganglia infarct unchanged. Vascular: Calcification of the cavernous internal carotid arteries consistent with cerebrovascular atherosclerotic disease. No signs of intracranial large vessel occlusion. Skull: Normal. Negative for fracture or focal lesion. Sinuses/Orbits: No acute finding. Other: None. ASPECTS [facility] Stroke Program Early CT Score) - Ganglionic level infarction (caudate, lentiform nuclei, internal capsule, insula, M1-M3 cortex): 6 - Supraganglionic infarction (M4-M6 cortex): 2 Total score (0-10 with 10 being normal): 8 IMPRESSION: 1. New areas of hypoattenuation involving LEFT frontal operculum, LEFT frontal cortex, and subcortical white matter, consistent with extension of the  previously documented acute LEFT MCA territory infarct. No reperfusion hemorrhage. Unchanged RIGHT lateral ventricle subependymoma. No signs of large vessel occlusion. 2. ASPECTS is 8. These results were called by telephone at the time of interpretation on 02/01/2018 at 10:04 am to Dr. Cheral Marker, Who verbally acknowledged these results. Electronically Signed   By: Staci Righter M.D.   On: 02/01/2018 10:05    EKG: Apparently this was done in the ED but I cannot see the tracing or the result on Epic. He has been in NSR on telemetry  Labs on Admission: I have personally reviewed the available labs and imaging studies at the time of the admission.  Pertinent labs:  BMET unremarkable CBC unremarkable Coags WNL Last HbA1C 6.5 01/30/2018 Last TSH WNL 01/29/2018   Assessment/Plan Principal Problem:   CVA (cerebral vascular accident) [facility]) Active Problems:   Essential hypertension   BPH (benign prostatic hyperplasia)   Observed seizure-like activity (HCC)   CVA: this appears to be an extension of the previously documented acute CVA from 9/26 vs TIA. Potentially also embolic although pt has no h/o AF and no documented AF on telemetry.  -appreciate neuro recs -cont ASA 81 mg, plavix 75 mg -consider empiric antioagulation -cont statin -will need 30 day event monitor if AF not captured during his hospital stay -MRI ordered -permissive HTN up to 220/110 mmHg -passed stroke swallow screen -PT/ST -frequent neuro checks -telemetry monitoring  Apparent seizure, new-onset -EEG (awake) was WNL -was loaded with keppra this morning, cont 500 mg po BID -neuro recs  HTN -hold antihypertensives for permissive HTN up to 220/110 mmHg  BPH -cont dutasteride    DVT prophylaxis:  Lovenox  Code Status: DNR - confirmed with patient/family Family Communication: wife and son at bedside  Disposition Plan:  Home once clinically improved Consults called: neurology  Admission status: Admit - It is my  clinical opinion that admission to INPATIENT is reasonable and necessary because of the expectation that this patient will require hospital care that crosses at least 2 midnights to treat this condition based on the medical complexity of the problems presented.  Given the aforementioned information, the predictability of an adverse outcome is felt to be significant.     Janora Norlander MD [facility]  If note is complete, please contact covering daytime or nighttime physician. [email or web address] Password [email or web address]  02/01/2018, 11:05 AM

## 2018-02-01 NOTE — Progress Notes (Signed)
EEG completed; results pending.    

## 2018-02-01 NOTE — ED Notes (Cosign Needed)
Called [facility] to cancel code stroke  

## 2018-02-01 NOTE — Plan of Care (Signed)
progressing 

## 2018-02-01 NOTE — Evaluation (Signed)
Speech Language Pathology Evaluation Patient Details Name: [name] MRN: [id number] DOB: 01-02-1946 Today's Date: 02/01/2018 Time: 4270-6237 SLP Time Calculation (min) (ACUTE ONLY): 28 min  Problem List:  Patient Active Problem List   Diagnosis Date Noted  . CVA (cerebral vascular accident) ([place]) 01/29/2018  . Brain tumor ([place]) 01/29/2018  . Essential hypertension 01/29/2018  . BPH (benign prostatic hyperplasia) 01/29/2018  . Anal or rectal pain 05/27/2011   Past Medical History:  Past Medical History:  Diagnosis Date  . CVA (cerebral vascular accident) ([place]) 01/29/2018  . Diarrhea   . Difficulty urinating   . Hypertension   . Rectal bleeding   . Rectal pain    Past Surgical History:  Past Surgical History:  Procedure Laterality Date  . BACK SURGERY  1980's  . CHOLECYSTECTOMY  2005  . [place]  2011  . NECK SURGERY  2009  . NOSE SURGERY  2006   HPI:  72 y.o. male PMH HTN, recent CVA 01/29/2018 (small left frontal infarcts) [facility] ED with right facial droop and expressive aphasia. Code stroke was called by EDP and canceled by neurologist. Parkinsonian findings were also noted on exam. Had 30 second period of R facial tingling with facial jerking earlier today and was postictal afterwards with expressive aphasia.   Assessment / Plan / Recommendation Clinical Impression   Patient presents today with aphasia most consistent with conduction type, with expressive > receptive impairments. Pt was not seen by SLP during admission last week, however wife reports pt has had difficulty expressing himself since that time, with worsening aphasia after seizures today. She reports having to "figure out" what pt is saying and "I'm his sentence-finisher." Spontaneous speech is agrammatic, effortful, and consists primarily of noun-verb phrases. His repetition is intact. He follows simple one step commands and responds to simple Y/N questions with 100% accuracy, however with  added complexity and steps, accuracy drops to ~25%. Repetition is intact. Pt able to read at paragraph level, though some paraphasias noted when reading menu. Writing was not formally assessed although pt reports he has had difficulty with this. Anticipate improvements though given expressive difficulties prior to seizures recommend OP SLP follow-up for aphasia and further assessment. Will follow acutely.     SLP Assessment  SLP Recommendation/Assessment: Patient needs continued Speech Lanaguage Pathology Services SLP Visit Diagnosis: Aphasia (R47.01)    Follow Up Recommendations  Outpatient SLP    Frequency and Duration min 1 x/week  1 week      SLP Evaluation Cognition  Overall Cognitive Status: Within Functional Limits for tasks assessed Arousal/Alertness: Awake/alert Orientation Level: Oriented X4 Attention: Selective Selective Attention: Appears intact       Comprehension  Auditory Comprehension Overall Auditory Comprehension: Impaired Yes/No Questions: Impaired Basic Biographical Questions: 76-100% accurate(100%) Basic Immediate Environment Questions: 75-100% accurate(100%) Complex Questions: 25-49% accurate(25%) Commands: Impaired One Step Basic Commands: 75-100% accurate Two Step Basic Commands: 25-49% accurate Conversation: Simple EffectiveTechniques: Visual/Gestural cues;Repetition Visual Recognition/Discrimination Discrimination: Within Function Limits Reading Comprehension Reading Status: Impaired Paragraph Level: Within functional limits Functional Environmental (signs, name badge): Impaired(semantic/phonemic paraphasias when reading menu)    Expression Expression Primary Mode of Expression: Verbal Verbal Expression Overall Verbal Expression: Impaired Initiation: Impaired Automatic Speech: Name;Social Response;Day of week;Month of year Level of Generative/Spontaneous Verbalization: Word;Phrase Repetition: No impairment(10/10) Naming:  Impairment Confrontation: Impaired Pragmatics: No impairment Effective Techniques: Semantic cues;Phonemic cues;Sentence completion Non-Verbal Means of Communication: Not applicable Written Expression Dominant Hand: Right Written Expression: Not tested   Oral / Motor  Oral Motor/Sensory Function Overall Oral Motor/Sensory Function: Within functional limits Motor Speech Overall Motor Speech: Appears within functional limits for tasks assessed   [place], [place], [place] Pager: [phone] Office: [phone]  Aliene Altes 02/01/2018, 4:16 PM

## 2018-02-01 NOTE — ED Triage Notes (Signed)
Pt presents via [facility] for evaluation of stroke like symptoms. Was recently d/c for stroke. Patient was at breakfast with wife when he felt his R face feeling "weird." Pt wife states at [place] he had R sided facial droop and some expressive aphasia. On arrival patient has mild dysarthria and slowed movement/speech. No unilateral weakness. No facial droop.

## 2018-02-01 NOTE — ED Provider Notes (Signed)
[facility] EMERGENCY DEPARTMENT Provider Note   CSN: [id number] Arrival date & time: 02/01/18  5366     History   Chief Complaint Chief Complaint  Patient presents with  . Code Stroke    HPI [name] is a 72 y.o. male.  The history is provided by the patient and the spouse.    Level 5 caveat due to acuity of condition and new onset neurologic abnormality.   [name] is a 72 y.o. male, with a history of HTN and CVA, presenting to the ED with expressive aphasia.  Last known normal 8 AM this morning.  Patient's wife estimates that around 8:25 AM this morning she noted patient with right-sided facial droop and expressive aphasia.  Facial droop resolved prior to EMS arrival. Patient was diagnosed with CVA on January 29, 2018.  He presented with expressive aphasia at that time.  He was discharged September 27 and his symptoms had resolved prior to discharge. Denies extremity weakness, falls/head trauma, headache, chest pain, shortness of breath, abdominal pain, N/V/D, fever, or any other complaints.    Past Medical History:  Diagnosis Date  . CVA (cerebral vascular accident) ([place]) 01/29/2018  . Diarrhea   . Difficulty urinating   . Hypertension   . Rectal bleeding   . Rectal pain     Patient Active Problem List   Diagnosis Date Noted  . CVA (cerebral vascular accident) ([place]) 01/29/2018  . Brain tumor ([place]) 01/29/2018  . Essential hypertension 01/29/2018  . BPH (benign prostatic hyperplasia) 01/29/2018  . Anal or rectal pain 05/27/2011    Past Surgical History:  Procedure Laterality Date  . BACK SURGERY  1980's  . CHOLECYSTECTOMY  2005  . [place]  2011  . NECK SURGERY  2009  . NOSE SURGERY  2006        Home Medications    Prior to Admission medications   Medication Sig Start Date End Date Taking? Authorizing Provider  aspirin EC 81 MG tablet Take 81 mg by mouth once.   Yes [provider]  atorvastatin (LIPITOR)  20 MG tablet Take 1 tablet (20 mg total) by mouth daily at 6 PM. 01/30/18  Yes Eliseo Squires, Jessica U, DO  clopidogrel (PLAVIX) 75 MG tablet Take 1 tablet (75 mg total) by mouth daily. 01/31/18  Yes Eulogio Bear U, DO  doxazosin (CARDURA) 4 MG tablet Take 4 mg by mouth at bedtime.  11/11/13  Yes [provider]  dutasteride (AVODART) 0.5 MG capsule Take 0.5 mg by mouth daily. 12/29/17  Yes [provider]  finasteride (PROSCAR) 5 MG tablet Take 5 mg by mouth daily.  11/28/13  Yes [provider]  HYDROcodone-acetaminophen (NORCO/VICODIN) 5-325 MG tablet Take 1 tablet by mouth daily as needed (for pain).  12/26/17  Yes [provider]  hydrocortisone (ANUSOL-HC) 25 MG suppository Place 25 mg rectally See admin instructions. UNWRAP AND INSERT 1 SUPPOSITORY RECTALLY TWICE DAILY AS NEEDED FOR HEMORRHOID SYMPTOMS 01/20/18  Yes [provider]  hydrocortisone cream (PREPARATION H) 1 % Apply 1 application topically 2 (two) times daily as needed for itching.   Yes [provider]  losartan (COZAAR) 25 MG tablet Take 25 mg by mouth daily. 01/15/18  Yes [provider]    Family History Family History  Problem Relation Age of Onset  . Cancer Mother        breast/colon  . Heart disease Father   . Stroke Neg Hx  Social History Social History   Tobacco Use  . Smoking status: Never Smoker  . Smokeless tobacco: Never Used  Substance Use Topics  . Alcohol use: Not Currently    Alcohol/week: 0.0 standard drinks    Comment: occ.  . Drug use: No     Allergies   Patient has no known allergies.   Review of Systems Review of Systems  Unable to perform ROS: Acuity of condition  Neurological: Positive for facial asymmetry and speech difficulty.     Physical Exam Updated Vital Signs BP 136/70   Pulse 75   Resp (!) 23   SpO2 92%   Physical Exam  Constitutional: He appears well-developed and well-nourished. No distress.  HENT:  Head:  Normocephalic and atraumatic.  Eyes: Conjunctivae are normal.  Neck: Neck supple.  Cardiovascular: Normal rate, regular rhythm, normal heart sounds and intact distal pulses.  Pulmonary/Chest: Effort normal and breath sounds normal. No respiratory distress.  Abdominal: Soft. There is no tenderness. There is no guarding.  Musculoskeletal: He exhibits no edema.  Lymphadenopathy:    He has no cervical adenopathy.  Neurological: He is alert.  Patient was evaluated at the intake desk while still on the EMS stretcher.   Patient appears to have some expressive aphasia with dysarthria. No noted facial droop.  Grip strength equal.  No arm drift.  Motor function intact in each of the extremities.   Sensation to light touch appears to be grossly intact in the extremities.  Skin: Skin is warm and dry. He is not diaphoretic.  Psychiatric: He has a normal mood and affect. His behavior is normal.  Nursing note and vitals reviewed.    ED Treatments / Results  Labs (all labs ordered are listed, but only abnormal results are displayed) Labs Reviewed  COMPREHENSIVE METABOLIC PANEL - Abnormal; Notable for the following components:      Result Value   Glucose, Bld 141 (*)    Calcium 8.8 (*)    Albumin 3.4 (*)    All other components within normal limits  I-STAT CHEM 8, ED - Abnormal; Notable for the following components:   Glucose, Bld 139 (*)    Calcium, Ion 1.07 (*)    All other components within normal limits  ETHANOL  PROTIME-INR  APTT  CBC  DIFFERENTIAL  RAPID URINE DRUG SCREEN, HOSP PERFORMED  URINALYSIS, ROUTINE W REFLEX MICROSCOPIC  I-STAT TROPONIN, ED    EKG EKG Interpretation  Date/Time:  Sunday February 01 2018 10:01:40 EDT Ventricular Rate:  78 PR Interval:    QRS Duration: 102 QT Interval:  395 QTC Calculation: 450 R Axis:   16 Text Interpretation:  Sinus rhythm Minimal ST depression, inferior leads No significant change since last tracing \ Confirmed by Deno Etienne  [phone]) on 02/01/2018 11:38:04 AM  Radiology Ct Head Code Stroke Wo Contrast  Result Date: 02/01/2018 CLINICAL DATA:  Code stroke. Awoke with increased RIGHT-sided facial droop, and expressive aphasia. Atrial fibrillation. EXAM: CT HEAD WITHOUT CONTRAST TECHNIQUE: Contiguous axial images were obtained from the base of the skull through the vertex without intravenous contrast. COMPARISON:  CT head 01/29/2018. MR head 01/29/2018. CTA head neck 01/29/2018. FINDINGS: Brain: New areas of hypoattenuation involving the LEFT frontal operculum, LEFT frontal cortex, and subcortical white matter compared with CT and MR 3 days ago consistent with extension of the previous LEFT MCA territory infarct. No hemorrhage, intra-axial mass lesion, hydrocephalus, or extra-axial fluid. Unchanged RIGHT lateral ventricle subependymoma. Chronic RIGHT basal ganglia infarct unchanged. Vascular: Calcification of  the cavernous internal carotid arteries consistent with cerebrovascular atherosclerotic disease. No signs of intracranial large vessel occlusion. Skull: Normal. Negative for fracture or focal lesion. Sinuses/Orbits: No acute finding. Other: None. ASPECTS [facility] Stroke Program Early CT Score) - Ganglionic level infarction (caudate, lentiform nuclei, internal capsule, insula, M1-M3 cortex): 6 - Supraganglionic infarction (M4-M6 cortex): 2 Total score (0-10 with 10 being normal): 8 IMPRESSION: 1. New areas of hypoattenuation involving LEFT frontal operculum, LEFT frontal cortex, and subcortical white matter, consistent with extension of the previously documented acute LEFT MCA territory infarct. No reperfusion hemorrhage. Unchanged RIGHT lateral ventricle subependymoma. No signs of large vessel occlusion. 2. ASPECTS is 8. These results were called by telephone at the time of interpretation on 02/01/2018 at 10:04 am to Dr. Cheral Marker, Who verbally acknowledged these results. Electronically Signed   By: Staci Righter M.D.   On: 02/01/2018  10:05    Procedures .Critical Care Performed by: Lorayne Bender, PA-C Authorized by: Lorayne Bender, PA-C   Critical care provider statement:    Critical care time (minutes):  35   Critical care time was exclusive of:  Separately billable procedures and treating other patients   Critical care was necessary to treat or prevent imminent or life-threatening deterioration of the following conditions:  CNS failure or compromise   Critical care was time spent personally by me on the following activities:  Development of treatment plan with patient or surrogate, discussions with consultants, examination of patient, obtaining history from patient or surrogate, review of old charts, re-evaluation of patient's condition, pulse oximetry, ordering and review of radiographic studies, ordering and review of laboratory studies and ordering and performing treatments and interventions   I assumed direction of critical care for this patient from another provider in my specialty: no     (including critical care time)  Medications Ordered in ED Medications  levETIRAcetam (KEPPRA) IVPB 1000 mg/100 mL premix ( Intravenous Stopped 02/01/18 1052)     Initial Impression / Assessment and Plan / ED Course  I have reviewed the triage vital signs and the nursing notes.  Pertinent labs & imaging results that were available during my care of the patient were reviewed by me and considered in my medical decision making (see chart for details).  Clinical Course as of Feb 01 1138  Sun Feb 01, 2018  1006 Spoke with Dr. Cheral Marker, neurologist.  States patient's symptoms have resolved.  Code stroke canceled.  Recommends admission via hospitalist.  He will be followed by the stroke team who will evaluate and make further recommendations for possible anticoagulation.    [SJ]  [place] to patient's room for patient behaving abnormally. Noted patient having what appears to be a seizure affecting especially the right side of the  face.  Seizure activity lasted for less than a minute.  Patient postictal upon resolution.  Noted to have right-sided facial droop. Right pupil approximately 66mm, left approximately 72mm.     [SJ]  1026 Updated Dr. Cheral Marker.  Advises to administer a loading dose of 1 g Keppra IV and EEG performed prior to patient going to MRI.  Patient will then be put on 500 mg Keppra twice daily.   [SJ]  81 Spoke with Dr. Steffanie Dunn, hospitalist. Agrees to admit the patient.   [SJ]    Clinical Course User Index [SJ] Joy, Shawn C, PA-C   Patient presents with expressive aphasia.  Symptoms resolved, but then he had a seizure and symptoms recurred.  Admitted for further evaluation and management.  MRI pending at admission.    Findings and plan of care discussed with Deno Etienne, DO. Dr. Tyrone Nine personally evaluated and examined this patient.  Vitals:   02/01/18 1030 02/01/18 1045 02/01/18 1100 02/01/18 1115  BP: (!) 153/79 136/84 (!) 141/74 136/70  Pulse: 81 75 76 75  Resp: (!) 21 (!) 22 (!) 21 (!) 23  SpO2: 96% 93% 93% 92%      Final Clinical Impressions(s) / ED Diagnoses   Final diagnoses:  Aphasia    ED Discharge Orders    None       Lorayne Bender, PA-C 02/01/18 [place], [place], DO 02/01/18 1146

## 2018-02-01 NOTE — ED Notes (Signed)
Pt had approximately 30 second period of R facial tingling with facial jerking witnessed by NT. Pt was postictal afterwards with expressive aphasia similar to initial findings at home and in route to hospital that had previously resolved. Shawn, PA at bedside. No oral trauma or incontinence.

## 2018-02-01 NOTE — Procedures (Signed)
Date of recording9/29/2019  Referring provider [place]  Reason for the study Altered mental status  Technical Digital EEG recording using 10-20 international electrode system  Description of the recording When awake posterior dominant rhythm is7-9Hz  symmetrical reactive Non REM stage II sleepwas not obtained. Epileptiform features were not seen during this recording  Impression The EEG isnormalin awake state only

## 2018-02-01 NOTE — Consult Note (Addendum)
NEURO HOSPITALIST  CONSULT     Requesting Physician: Dr. Tyrone Nine    Chief Complaint: Right side facial droop  History obtained from:  Patient / wife  HPI:                                                                                                                                         [name] is an 72 y.o. male  With PMH HTN, CVA 01/2018 no residual deficits presented to [facility] ED with right facial droop and expressive aphasia. Code stroke was called by EDP and canceled by neurologist.   Patient woke up this morning in his usual state of health and at [place]. His wife noticed a sudden onset right sided facial droop. Upon further questioning it was actually that the right side of his face was drawn upward and not an actual droop.  Facial abnormality resolved prior to EMS arrival. No other deficits noted. Denies any CP, weakness, numbness, tingling or vision problems. No ETOH, smoking, drug abuse. Patient was admitted to [facility] 01/29/18 and found to have an acute ischemic stroke involving his left frontal operculum Was discharged on 01/30/18. He was started on plavix 75 mg daily 3 days ago and continues to take daily ASA at 81 mg.  Per wife patient does at times have a pill rolling like tremor of the left hand, which has been present for an unknown time period.  After initial exam and evaluation, neurology was called back for possible new onset seizure, described as follows: "30 second period of R facial tingling with facial jerking witnessed by NT. Pt was postictal afterwards with expressive aphasia similar to initial findings at home and in route to hospital that had previously resolved". No tongue bites or incontinence. . ED course:  CT head: New areas of hypoattenuation involving LEFT frontal operculum, LEFT frontal cortex, and subcortical white matter, consistent with extension of the previously documented acute LEFT MCA territory infarct. No  reperfusion hemorrhage. Unchanged RIGHT lateral ventricle subependymoma. No signs of large vessel occlusion.  BG: 139 BP: SBP> 200 per EMS report Loaded with 1000 mg keppra  MRI 01/29/18:  1. Small acute left MCA infarcts in the frontal lobe. 2. Mild chronic small vessel ischemic disease. 3. 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 Follow up MRI with contrast showed no enhancement of the ventricular mass, which would be atypical for subependymoma  Date last known well: Date: 02/01/2018 Time last known well: Time: 07:30 tPA Given: No: too mild to treat Modified Rankin: Rankin Score=1 NIHSS:1 (dysarthria)  Past Medical History:  Diagnosis Date  . Diarrhea   .  Difficulty urinating   . Hypertension   . Rectal bleeding   . Rectal pain     Past Surgical History:  Procedure Laterality Date  . BACK SURGERY  1980's  . CHOLECYSTECTOMY  2005  . [place]  2011  . NECK SURGERY  2009  . NOSE SURGERY  2006    Family History  Problem Relation Age of Onset  . Cancer Mother        breast/colon  . Heart disease Father   . Stroke Neg Hx          Social History:  reports that he has never smoked. He has never used smokeless tobacco. He reports that he drank alcohol. He reports that he does not use drugs.  Allergies: No Known Allergies  Medications:                                                                                                                           No current facility-administered medications for this encounter.    Current Outpatient Medications  Medication Sig Dispense Refill  . aspirin EC 81 MG tablet Take 81 mg by mouth once.    Marland Kitchen atorvastatin (LIPITOR) 20 MG tablet Take 1 tablet (20 mg total) by mouth daily at 6 PM. 30 tablet 0  . clopidogrel (PLAVIX) 75 MG tablet Take 1 tablet (75 mg total) by mouth daily. 30 tablet 0  . doxazosin (CARDURA) 4 MG tablet Take 4 mg by mouth at bedtime.     . dutasteride (AVODART) 0.5 MG capsule Take  0.5 mg by mouth daily.  3  . finasteride (PROSCAR) 5 MG tablet Take 5 mg by mouth daily.     Marland Kitchen HYDROcodone-acetaminophen (NORCO/VICODIN) 5-325 MG tablet Take 1 tablet by mouth daily as needed (for pain).   0  . hydrocortisone (ANUSOL-HC) 25 MG suppository Place 25 mg rectally See admin instructions. UNWRAP AND INSERT 1 SUPPOSITORY RECTALLY TWICE DAILY AS NEEDED FOR HEMORRHOID SYMPTOMS  11  . hydrocortisone cream (PREPARATION H) 1 % Apply 1 application topically 2 (two) times daily as needed for itching.    . losartan (COZAAR) 25 MG tablet Take 25 mg by mouth daily.  3  . terbinafine (LAMISIL) 250 MG tablet Take 250 mg by mouth daily.  2     ROS:  ROS was performed and is negative except as noted in HPI   General Examination:                                                                                                      There were no vitals taken for this visit.  HEENT-  Normocephalic, no lesions, without obvious abnormality.  Normal external eye and conjunctiva.  Cardiovascular- S1-S2 audible, pulses palpable throughout   Lungs-no rhonchi or wheezing noted, no excessive working breathing.  Saturations within normal limits Abdomen- All 4 quadrants palpated and nontender Extremities- Warm, dry and intact Musculoskeletal-no joint tenderness, deformity or swelling Skin-warm and dry, Seborrheic dermatitis noted  In nasolabial area.  Neurological Examination Mental Status: Alert, oriented, thought content appropriate. Naming and repetition intact,  Hypophonic and no evidence of aphasia. Dysarthria noted.  Able to  commands without difficulty. hypomimia noted. Cranial Nerves: II: Visual fields grossly normal,  III,IV, VI: ptosis not present, extra-ocular motions intact bilaterally, pupils anisocoric Right eye reacts briskly from a 4-3 and left eye briskly  3-2, round, reactive to light and accommodation V,VII: smile symmetric, facial light touch/ cool temperature sensation normal bilaterally VIII: hearing normal bilaterally IX,X: uvula rises symmetrically XI: bilateral shoulder shrug XII: midline tongue extension Motor: Right : Upper extremity   5/5  Left:     Upper extremity   5/5  Lower extremity   5/5  Lower extremity   5/5 Slight cogwheel rigidity of LUE No atrophy noted Sensory: cool temperature sensation and light touch intact throughout, bilaterally Deep Tendon Reflexes: 2+ biceps bilaterally, areflexic bilateral patella, 1+ achilles bilaterally. Plantars: Right: downgoing   Left: downgoing Cerebellar: No ataxia with FNF or H-S bilaterally. Some bradykinesia noted.  Gait: Short stride length, 8-step turns noted. No retropulsion   Lab Results: Basic Metabolic Panel: Recent Labs  Lab 01/29/18 1220 01/29/18 1230 02/01/18 0947  NA 140 138 138  K 4.6 4.3 4.0  CL 99 99 102  CO2 29  --   --   GLUCOSE 160* 159* 139*  BUN 9 11 13   CREATININE 1.11 1.10 1.00  CALCIUM 9.6  --   --     CBC: Recent Labs  Lab 01/29/18 1220 01/29/18 1230 02/01/18 0943 02/01/18 0947  WBC 9.4  --  8.9  --   NEUTROABS 6.1  --  5.4  --   HGB 17.0 18.0* 16.1 16.7  HCT 53.5* 53.0* 50.5 49.0  MCV 97.3  --  97.5  --   PLT 201  --  176  --     Lipid Panel: Recent Labs  Lab 01/30/18 0347  CHOL 207*  TRIG 321*  HDL 30*  CHOLHDL 6.9  VLDL 64*  LDLCALC 113*    CBG: Recent Labs  Lab 01/29/18 1222 01/30/18 0924  GLUCAP 148* 155*    Imaging: Ct Head Code Stroke Wo Contrast  Result Date: 02/01/2018 CLINICAL DATA:  Code stroke. Awoke with increased RIGHT-sided facial droop, and expressive aphasia. Atrial fibrillation. EXAM: CT HEAD WITHOUT CONTRAST TECHNIQUE: Contiguous axial images were obtained from the base of the  skull through the vertex without intravenous contrast. COMPARISON:  CT head 01/29/2018. MR head 01/29/2018. CTA head neck  01/29/2018. FINDINGS: Brain: New areas of hypoattenuation involving the LEFT frontal operculum, LEFT frontal cortex, and subcortical white matter compared with CT and MR 3 days ago consistent with extension of the previous LEFT MCA territory infarct. No hemorrhage, intra-axial mass lesion, hydrocephalus, or extra-axial fluid. Unchanged RIGHT lateral ventricle subependymoma. Chronic RIGHT basal ganglia infarct unchanged. Vascular: Calcification of the cavernous internal carotid arteries consistent with cerebrovascular atherosclerotic disease. No signs of intracranial large vessel occlusion. Skull: Normal. Negative for fracture or focal lesion. Sinuses/Orbits: No acute finding. Other: None. ASPECTS [facility] Stroke Program Early CT Score) - Ganglionic level infarction (caudate, lentiform nuclei, internal capsule, insula, M1-M3 cortex): 6 - Supraganglionic infarction (M4-M6 cortex): 2 Total score (0-10 with 10 being normal): 8 IMPRESSION: 1. New areas of hypoattenuation involving LEFT frontal operculum, LEFT frontal cortex, and subcortical white matter, consistent with extension of the previously documented acute LEFT MCA territory infarct. No reperfusion hemorrhage. Unchanged RIGHT lateral ventricle subependymoma. No signs of large vessel occlusion. 2. ASPECTS is 8. These results were called by telephone at the time of interpretation on 02/01/2018 at 10:04 am to Dr. Cheral Marker, Who verbally acknowledged these results. Electronically Signed   By: Staci Righter M.D.   On: 02/01/2018 10:05   Laurey Morale, MSN, NP-C [facility] [phone] 02/01/2018, 9:59 AM     Assessment: 72 y.o. male PMH HTN, recent CVA 01/2018 without residual deficits presenting to the [facility] ED with right facial droop and expressive aphasia. Code stroke was called by EDP and canceled by neurologist given NIHSS of 0.  1. This is probably a TIA or extension of old stroke. Continue ASA and Plavix for now. Stroke team to consider  starting empiric anticoagulation treatment in the AM. Will also need to initiate 30 day cardiac monitoring for possible paroxysmal a-fib, which was suspected at prior admission.  2. 30 second spell of right facial tingling with facial jerking witnessed by NT. Patient was postictal afterwards with expressive aphasia similar to initial transient symptoms at home. Clinically most consistent with new-onset seizure, most likely due to new epileptogenic focus from his recent stroke. He has been Loaded with 1000 mg Keppra and routine EEG ordered.  3. Exhibits several Parkinsonian findings on exam although this is not the reason for his visit today.  4. Stroke Risk Factors - hypertension    Recommendations: -- BP goal : Permissive HTN upto 220/110 mmHg   -- Repeat MRI Brain  -- Continue ASA 81 mg and Plavix 75 mg daily -- Stroke team to consider empiric anticoagulation treatment tomorrow AM, if subclinical seizure is not felt to be the etiology for his re-presentation today --Telemetry monitoring --Frequent neuro checks --Keppra 1000 mg IV x 1 administered. Continue scheduled Keppra at 500 mg PO BID -- EEG -- Stroke swallow screen  -- Please page stroke NP  Or  PA  Or MD from 8am -4 pm  as this patient from this time will be  followed by the stroke.   You can look them up on [email or web address]  Password [email or web address]  I have seen and examined the patient. I have amended the exam, assessment and recommendations above. Discussed with patient, family, NP and EDP.  Electronically signed: Dr. Kerney Elbe

## 2018-02-02 ENCOUNTER — Other Ambulatory Visit: Payer: Self-pay | Admitting: Cardiology

## 2018-02-02 ENCOUNTER — Encounter (HOSPITAL_COMMUNITY): Payer: Medicare Other

## 2018-02-02 DIAGNOSIS — R4701 Aphasia: Secondary | ICD-10-CM

## 2018-02-02 DIAGNOSIS — I6300 Cerebral infarction due to thrombosis of unspecified precerebral artery: Secondary | ICD-10-CM

## 2018-02-02 DIAGNOSIS — I639 Cerebral infarction, unspecified: Secondary | ICD-10-CM

## 2018-02-02 DIAGNOSIS — R569 Unspecified convulsions: Secondary | ICD-10-CM

## 2018-02-02 LAB — CBC
HCT: 47.1 % (ref 39.0–52.0)
Hemoglobin: 15.3 g/dL (ref 13.0–17.0)
MCH: 31.4 pg (ref 26.0–34.0)
MCHC: 32.5 g/dL (ref 30.0–36.0)
MCV: 96.7 fL (ref 78.0–100.0)
Platelets: 155 10*3/uL (ref 150–400)
RBC: 4.87 MIL/uL (ref 4.22–5.81)
RDW: 12.6 % (ref 11.5–15.5)
WBC: 9.6 10*3/uL (ref 4.0–10.5)

## 2018-02-02 LAB — BASIC METABOLIC PANEL
Anion gap: 7 (ref 5–15)
BUN: 11 mg/dL (ref 8–23)
CO2: 25 mmol/L (ref 22–32)
Calcium: 8.5 mg/dL — ABNORMAL LOW (ref 8.9–10.3)
Chloride: 105 mmol/L (ref 98–111)
Creatinine, Ser: 0.89 mg/dL (ref 0.61–1.24)
GFR calc Af Amer: >60 mL/min (ref >60)
GFR calc non Af Amer: >60 mL/min (ref >60)
Glucose, Bld: 106 mg/dL — ABNORMAL HIGH (ref 70–99)
Potassium: 3.9 mmol/L (ref 3.5–5.1)
Sodium: 137 mmol/L (ref 135–145)

## 2018-02-02 MED ORDER — SODIUM CHLORIDE 0.9 % IV SOLN
INTRAVENOUS | Status: DC
  Administered 2018-02-02 – 2018-02-03 (×2): via INTRAVENOUS

## 2018-02-02 MED ORDER — ATORVASTATIN CALCIUM 80 MG PO TABS
80.0000 mg | ORAL_TABLET | Freq: Every day | ORAL | Status: DC
  Administered 2018-02-02 – 2018-02-03 (×2): 80 mg via ORAL
  Filled 2018-02-02 (×2): qty 1

## 2018-02-02 NOTE — Plan of Care (Signed)
  Problem: Coping: Goal: Will verbalize positive feelings about self Outcome: Progressing   Problem: Education: Goal: Knowledge of disease or condition will improve Outcome: Progressing   Problem: Clinical Measurements: Goal: Ability to maintain clinical measurements within normal limits will improve Outcome: Progressing

## 2018-02-02 NOTE — Progress Notes (Signed)
    [facility] has been requested to perform a transesophageal echocardiogram on 02/02/2018 for CVA.  After careful review of history and examination, the risks and benefits of transesophageal echocardiogram have been explained including risks of esophageal damage, perforation (1:10,000 risk), bleeding, pharyngeal hematoma as well as other potential complications associated with conscious sedation including aspiration, arrhythmia, respiratory failure and death. Alternatives to treatment were discussed, questions were answered. Patient is willing to proceed.   72 yo male came in with recurrent CVA. Telemetry this morning showed ST, but no afib per EP. Vital stable. Plan for TEE with 30 day outpatient event monitor.   Almyra Deforest, PA-C 02/02/2018 12:21 PM

## 2018-02-02 NOTE — Evaluation (Signed)
Occupational Therapy Evaluation Patient Details Name: [name] MRN: [id number] DOB: 07/14/1945 Today's Date: 02/02/2018    History of Present Illness 72 y.o. male PMH HTN, recent CVA 01/29/2018 no residual deficits presented to [facility] ED with right facial droop and expressive aphasia. Code stroke was called by EDP and canceled by neurologist. Parkinsonian findings were also noted on exam. Per MD notes probable TIA or a possible extension of old stroke. Had 30 second period of R facial tingling with facial jerking earlier today and was postictal afterwards with expressive aphasia. EEG today was normal in awake state.   Clinical Impression   This 72 y/o male presents with the above. At baseline pt reports independence with ADLs and functional mobility, lives with spouse. Pt performing functional mobility using RW, toileting and LB ADLs with overall minguard assist. Pt presenting with word finding difficulties requiring increased time for responses. Pt also with minimal LUE fine motor deficits. Overall pt is moving well and feel is safe to return home with available family support/superivsion at time of discharge. Will continue to follow acutely to maximize pt's safety and independence with ADLs and mobility prior to return home.     Follow Up Recommendations  No OT follow up;Supervision/Assistance - 24 hour    Equipment Recommendations  None recommended by OT           Precautions / Restrictions Precautions Precautions: None Restrictions Weight Bearing Restrictions: No      Mobility Bed Mobility               General bed mobility comments: OOB upon arrival  Transfers Overall transfer level: Needs assistance Equipment used: Rolling walker (2 wheeled) Transfers: Sit to/from Stand Sit to Stand: Supervision         General transfer comment: for general safety, no physical assist required    Balance Overall balance assessment: Needs assistance Sitting-balance support: Feet  supported;No upper extremity supported Sitting balance-Leahy Scale: Good     Standing balance support: Single extremity supported;Bilateral upper extremity supported;During functional activity Standing balance-Leahy Scale: Fair                             ADL either performed or assessed with clinical judgement   ADL Overall ADL's : Needs assistance/impaired Eating/Feeding: Independent;Sitting   Grooming: Min guard;Standing   Upper Body Bathing: Min guard;Sitting   Lower Body Bathing: Min guard;Sit to/from stand   Upper Body Dressing : Set up;Sitting   Lower Body Dressing: Min guard;Sit to/from stand Lower Body Dressing Details (indicate cue type and reason): pt donning pants/underwear, completing in standing using single UE support on RW - cued pt to complete from sit<>stand level for increased safety though pt continues to perform task in standing Toilet Transfer: Min guard;Ambulation;Comfort height toilet;Grab bars;RW   Toileting- Water quality scientist and Hygiene: Min guard;Sit to/from stand       Functional mobility during ADLs: Min guard;Rolling walker       Vision Baseline Vision/History: Wears glasses Wears Glasses: At all times Patient Visual Report: No change from baseline Vision Assessment?: Yes Eye Alignment: Within Functional Limits Ocular Range of Motion: Within Functional Limits Alignment/Gaze Preference: Within Defined Limits Tracking/Visual Pursuits: Able to track stimulus in all quads without difficulty Visual Fields: No apparent deficits                Pertinent Vitals/Pain Pain Assessment: No/denies pain     Hand Dominance Right   Extremity/Trunk Assessment  Upper Extremity Assessment Upper Extremity Assessment: LUE deficits/detail LUE Deficits / Details: gross weakness noted (LUE>RUE), increased effort for completion of finger opposition LUE Coordination: decreased fine motor   Lower Extremity Assessment Lower Extremity  Assessment: Defer to PT evaluation       Communication Communication Communication: Expressive difficulties   Cognition Arousal/Alertness: Awake/alert Behavior During Therapy: Flat affect Overall Cognitive Status: Impaired/Different from baseline Area of Impairment: Safety/judgement;Awareness;Problem solving                         Safety/Judgement: Decreased awareness of safety;Decreased awareness of deficits Awareness: Emergent Problem Solving: Slow processing;Requires verbal cues;Requires tactile cues General Comments: pt A&Ox3, demonstrates some word finding difficulties requiring increased time to respond to questions; pt with flat affect throughout; reports that he was walking without AD PTA though per chart review pt was using [facility]; requires intermittent safety cues with use of DME and during functional tasks   General Comments       Exercises     Shoulder Instructions      Home Living Family/patient expects to be discharged to:: Private residence Living Arrangements: Spouse/significant other Available Help at Discharge: Family;Available PRN/intermittently Type of Home: House Home Access: Stairs to enter [employer] of Steps: 4 Entrance Stairs-Rails: Right Home Layout: Two level;Able to live on main level with bedroom/bathroom     Bathroom Shower/Tub: Tub/shower unit;Curtain   Bathroom Toilet: Handicapped height     Home Equipment: Kasandra Knudsen - single point   Additional Comments: wife works for [employer] With: Spouse    Prior Functioning/Environment Level of Independence: Independent with assistive device(s)        Comments: uses cane secondary to chronic knee issues        OT Problem List: Decreased strength;Decreased range of motion;Decreased activity tolerance;Decreased cognition      OT Treatment/Interventions: Self-care/ADL training;Therapeutic exercise;DME and/or AE instruction;Therapeutic activities;Balance  training;Patient/family education    OT Goals(Current goals can be found in the care plan section) Acute Rehab OT Goals Patient Stated Goal: none stated OT Goal Formulation: With patient Time For Goal Achievement: 02/16/18 Potential to Achieve Goals: Good  OT Frequency: Min 2X/week   Barriers to D/C:            Co-evaluation              AM-PAC PT "6 Clicks" Daily Activity     Outcome Measure Help from another person eating meals?: None Help from another person taking care of personal grooming?: None Help from another person toileting, which includes using toliet, bedpan, or urinal?: None Help from another person bathing (including washing, rinsing, drying)?: None Help from another person to put on and taking off regular upper body clothing?: None Help from another person to put on and taking off regular lower body clothing?: A Little 6 Click Score: 23   End of Session Equipment Utilized During Treatment: Rolling walker Nurse Communication: Mobility status  Activity Tolerance: Patient tolerated treatment well Patient left: Other (comment);with family/visitor present;with call bell/phone within reach(sitting EOB to begin PT session )  OT Visit Diagnosis: Muscle weakness (generalized) (M62.81)                Time: 4656-8127 OT Time Calculation (min): 17 min Charges:  OT General Charges $OT Visit: 1 Visit OT Evaluation $OT Eval Moderate Complexity: [place], OT [employer] Pager [phone] Office [phone]   Raymondo Band 02/02/2018, 3:21 PM

## 2018-02-02 NOTE — Evaluation (Signed)
Physical Therapy Evaluation Patient Details Name: [name] MRN: [id number] DOB: 30-Jan-1946 Today's Date: 02/02/2018   History of Present Illness  72 y.o. male PMH HTN, recent CVA 01/29/2018 no residual deficits presented to [facility] ED with right facial droop and expressive aphasia. Code stroke was called by EDP and canceled by neurologist. Parkinsonian findings were also noted on exam. Per MD notes probable TIA or a possible extension of old stroke. Had 30 second period of R facial tingling with facial jerking earlier today and was postictal afterwards with expressive aphasia. EEG today was normal in awake state.  Clinical Impression  Patient seen for therapy assessment.  Mobilizing well with modest deficits at this time. Feel patient would benefit from outpatient PT services upon discharge to maximize recovery of normal function but feel that patient is mobilizing safe enough for d/c home with family. Educated patient on techniques for improved cadence and amplitude. Patient and family receptive. Will defer further needs to outpatient services.     Follow Up Recommendations Outpatient PT    Equipment Recommendations  None recommended by PT    Recommendations for Other Services       Precautions / Restrictions Precautions Precautions: None Restrictions Weight Bearing Restrictions: No      Mobility  Bed Mobility               General bed mobility comments: OOB upon arrival  Transfers Overall transfer level: Needs assistance Equipment used: Rolling walker (2 wheeled) Transfers: Sit to/from Stand Sit to Stand: Supervision         General transfer comment: for general safety, no physical assist required  Ambulation/Gait Ambulation/Gait assistance: Supervision Gait Distance (Feet): 320 Feet Assistive device: Rolling walker (2 wheeled);None(intially utilized Rw, then progressed to no device) Gait Pattern/deviations: Step-through pattern;Decreased stride  length;Shuffle;Drifts right/left Gait velocity: decreased Gait velocity interpretation: <1.8 ft/sec, indicate of risk for recurrent falls General Gait Details: short shuffling gait with decreased speed and magnitude to stride. VCs for increased cadence, No overt LOB or instability noted at this tim  Stairs            Wheelchair Mobility    Modified Rankin (Stroke Patients Only) Modified Rankin (Stroke Patients Only) Pre-Morbid Rankin Score: No significant disability Modified Rankin: Slight disability     Balance Overall balance assessment: Needs assistance Sitting-balance support: Feet supported;No upper extremity supported Sitting balance-Leahy Scale: Good     Standing balance support: Single extremity supported;Bilateral upper extremity supported;During functional activity Standing balance-Leahy Scale: Fair                   Standardized Balance Assessment Standardized Balance Assessment : Dynamic Gait Index   Dynamic Gait Index Level Surface: Mild Impairment Change in Gait Speed: Mild Impairment Gait with Horizontal Head Turns: Normal Gait with Vertical Head Turns: Normal Gait and Pivot Turn: Mild Impairment Step Over Obstacle: Mild Impairment Step Around Obstacles: Mild Impairment Steps: Mild Impairment Total Score: 18       Pertinent Vitals/Pain Pain Assessment: No/denies pain    Home Living Family/patient expects to be discharged to:: Private residence Living Arrangements: Spouse/significant other Available Help at Discharge: Family;Available PRN/intermittently Type of Home: House Home Access: Stairs to enter Entrance Stairs-Rails: Right Entrance Stairs-Number of Steps: 4 Home Layout: Two level;Able to live on main level with bedroom/bathroom Home Equipment: Kasandra Knudsen - single point Additional Comments: wife works for [employer]    Prior Function Level of Independence: Independent with assistive device(s)  Comments: uses  cane secondary to chronic knee issues     Hand Dominance   Dominant Hand: Right    Extremity/Trunk Assessment   Upper Extremity Assessment Upper Extremity Assessment: LUE deficits/detail LUE Deficits / Details: gross weakness noted (LUE>RUE), increased effort for completion of finger opposition LUE Coordination: decreased fine motor    Lower Extremity Assessment Lower Extremity Assessment: Generalized weakness(modest assymetrical weakness L>R but grossly functional)       Communication   Communication: Expressive difficulties  Cognition Arousal/Alertness: Awake/alert Behavior During Therapy: Flat affect Overall Cognitive Status: Impaired/Different from baseline Area of Impairment: Safety/judgement;Awareness;Problem solving                         Safety/Judgement: Decreased awareness of safety;Decreased awareness of deficits Awareness: Emergent Problem Solving: Slow processing;Requires verbal cues;Requires tactile cues General Comments: pt A&Ox3, demonstrates some word finding difficulties requiring increased time to respond to questions; pt with flat affect throughout; reports that he was walking without AD PTA though per chart review pt was using SPC; requires intermittent safety cues with use of DME and during functional tasks      General Comments      Exercises     Assessment/Plan    PT Assessment All further PT needs can be met in the next venue of care  PT Problem List Decreased strength;Decreased activity tolerance;Decreased mobility       PT Treatment Interventions      PT Goals (Current goals can be found in the Care Plan section)  Acute Rehab PT Goals Patient Stated Goal: none stated PT Goal Formulation: All assessment and education complete, DC therapy    Frequency     Barriers to discharge        Co-evaluation               AM-PAC PT "6 Clicks" Daily Activity  Outcome Measure Difficulty turning over in bed (including  adjusting bedclothes, sheets and blankets)?: A Little Difficulty moving from lying on back to sitting on the side of the bed? : None Difficulty sitting down on and standing up from a chair with arms (e.g., wheelchair, bedside commode, etc,.)?: None Help needed moving to and from a bed to chair (including a wheelchair)?: A Little Help needed walking in hospital room?: A Little Help needed climbing 3-5 steps with a railing? : A Little 6 Click Score: 20    End of Session Equipment Utilized During Treatment: Gait belt Activity Tolerance: Patient tolerated treatment well Patient left: in bed;with call bell/phone within reach Nurse Communication: Mobility status PT Visit Diagnosis: Other symptoms and signs involving the nervous system (R29.898);Unsteadiness on feet (R26.81)    Time: 0413-6438 PT Time Calculation (min) (ACUTE ONLY): 15 min   Charges:   PT Evaluation $PT Eval Moderate Complexity: 1 Mod          Alben Deeds, PT DPT  Board Certified Neurologic Specialist Acute Rehabilitation Services Pager [phone] Office [place] 02/02/2018, 4:00 PM

## 2018-02-02 NOTE — Care Management Note (Signed)
Case Management Note  Patient Details  Name: [name] MRN: [id number] Date of Birth: 03/30/1946  Subjective/Objective:   Pt admitted with CVA. He is from home with spouse. PCP:    Dr Vanetta Shawl              Action/Plan: Awaiting PT/OT evals. CM following for d/c needs, physician orders.   Expected Discharge Date:                  Expected Discharge Plan:     In-House Referral:     Discharge planning Services     Post Acute Care Choice:    Choice offered to:     DME Arranged:    DME Agency:     HH Arranged:    HH Agency:     Status of Service:  In process, will continue to follow  If discussed at Long Length of Stay Meetings, dates discussed:    Additional Comments:  Pollie Friar, RN 02/02/2018, 12:32 PM

## 2018-02-02 NOTE — Progress Notes (Signed)
  Speech Language Pathology Treatment: Cognitive-Linquistic  Patient Details Name: [name] MRN: [id number] DOB: 1945-06-22 Today's Date: 02/02/2018 Time: 6606-3016 SLP Time Calculation (min) (ACUTE ONLY): 22 min  Assessment / Plan / Recommendation Clinical Impression  Skilled treatment session focused on communication goals. SLP facilitated session by providing phonemic cues to produce speech at phrase level (2 to 3 simple words). In insistences of difficulty expressing phrase with phonemic cues, SLP provided yes/no questions. Pt with good accuracy. Education provided to wife and son on use of yes/no to compensate for language breakdown. All questions answered to family satisfaction. Continue per current plan of care.    HPI HPI: 72 y.o. male PMH HTN, recent CVA 01/29/2018 no residual deficits presented to [facility] ED with right facial droop and expressive aphasia. Code stroke was called by EDP and canceled by neurologist. Parkinsonian findings were also noted on exam. Per MD notes probable TIA or a possible extension of old stroke. Had 30 second period of R facial tingling with facial jerking earlier today and was postictal afterwards with expressive aphasia. EEG today was normal in awake state.      SLP Plan  Continue with current plan of care       Recommendations                   Follow up Recommendations: Outpatient SLP SLP Visit Diagnosis: Aphasia (R47.01) Plan: Continue with current plan of care       [place] 02/02/2018, 1:34 PM

## 2018-02-02 NOTE — Progress Notes (Signed)
[facility] PROGRESS NOTE    Progress Note  [name]  HKV:[id number] DOB: 22-Mar-1946 DOA: 02/01/2018 PCP: Isaias Sakai, DO     Brief Narrative:   [name] is an 72 y.o. male past medical history significant for essential hypertension, CVA 01/29/2018 who presented to the ED today with complaint of right facial droop, expressive aphasia occult stroke was called and it was canceled as it had resolved by the time the patient was in the ED.  Afterwards she had a witnessed seizure she was loaded with Keppra  Assessment/Plan:   CVA (cerebral vascular accident) ([place]) MRI of the brain done on 02/02/2018 showed propagation of her previous stroke. We will continue her on aspirin and Plavix. There is been no events on telemetry of atrial fibrillation. Continue statins. Awaiting stroke team recommendations. Pressure has been ranging from 150 / 86-108/58. He is on an alpha-blocker Cardura and start for hyper tension, will hold these and he will probably go home without these.  Probable seizures: She was loaded with Keppra, continue twice daily. Continue neurochecks.  No seizures overnight.  Essential hypertension We will hold antihypertensive medication we will try to keep blood pressure above 140/80.  BPH (benign prostatic hyperplasia) Discontinue Cardura.   DVT prophylaxis: lovenox Family Communication:none Disposition Plan/Barrier to D/C: home in 1-2 day Code Status:     Code Status Orders  (From admission, onward)         Start     Ordered   02/01/18 1221  Do not attempt resuscitation (DNR)  Continuous    Question Answer Comment  In the event of cardiac or respiratory ARREST Do not call a "code blue"   In the event of cardiac or respiratory ARREST Do not perform Intubation, CPR, defibrillation or ACLS   In the event of cardiac or respiratory ARREST Use medication by any route, position, wound care, and other measures to relive pain and suffering. May  use oxygen, suction and manual treatment of airway obstruction as needed for comfort.      02/01/18 1220        Code Status History    Date Active Date Inactive Code Status Order ID Comments User Context   02/01/2018 1220 02/01/2018 1220 Full Code [id number]  Janora Norlander, MD Inpatient   01/29/2018 1803 01/30/2018 1710 DNR [id number]  Karmen Bongo, MD ED        IV Access:    Peripheral IV   Procedures and diagnostic studies:   Mr Brain Wo Contrast  Result Date: 02/01/2018 CLINICAL DATA:  Worsening expressive aphasia, RIGHT facial droop. History of hypertension and stroke. EXAM: MRI HEAD WITHOUT CONTRAST TECHNIQUE: Multiplanar, multiecho pulse sequences of the brain and surrounding structures were obtained without intravenous contrast. COMPARISON:  CT HEAD February 01, 2018 and MRI head January 29, 2018. FINDINGS: INTRACRANIAL CONTENTS: Patchy reduced diffusion LEFT frontal lobe extending to operculum increased in size from prior examination with low ADC values. No susceptibility artifact to suggest hemorrhage. Old RIGHT basal ganglia infarct. Patchy supratentorial and pontine white matter FLAIR T2 hyperintensities exclusive aforementioned abnormality. Old pontine lacunar infarct. Moderate parenchymal brain volume loss. Stable appearance of low T2, FLAIR T2 hyperintense 12 mm RIGHT lateral ventricle mass most compatible with subependymoma. VASCULAR: Normal major intracranial vascular flow voids present at skull base. SKULL AND UPPER CERVICAL SPINE: No abnormal sellar expansion. No suspicious calvarial bone marrow signal. Craniocervical junction maintained. SINUSES/ORBITS: LEFT maxillary mucosal retention cyst. Mild paranasal sinus mucosal thickening. Mastoid air cells are  well aerated. Included ocular globes and orbital contents are non-suspicious. OTHER: None. IMPRESSION: 1. Acute LEFT frontal lobe/MCA territory nonhemorrhagic infarct, propagation from prior MRI. 2. Old RIGHT basal  ganglia infarct. Mild-to-moderate chronic small vessel ischemic changes. 3. Moderate parenchymal brain volume loss. 4. 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 Electronically Signed   By: Elon Alas M.D.   On: 02/01/2018 23:54   Ct Head Code Stroke Wo Contrast  Result Date: 02/01/2018 CLINICAL DATA:  Code stroke. Awoke with increased RIGHT-sided facial droop, and expressive aphasia. Atrial fibrillation. EXAM: CT HEAD WITHOUT CONTRAST TECHNIQUE: Contiguous axial images were obtained from the base of the skull through the vertex without intravenous contrast. COMPARISON:  CT head 01/29/2018. MR head 01/29/2018. CTA head neck 01/29/2018. FINDINGS: Brain: New areas of hypoattenuation involving the LEFT frontal operculum, LEFT frontal cortex, and subcortical white matter compared with CT and MR 3 days ago consistent with extension of the previous LEFT MCA territory infarct. No hemorrhage, intra-axial mass lesion, hydrocephalus, or extra-axial fluid. Unchanged RIGHT lateral ventricle subependymoma. Chronic RIGHT basal ganglia infarct unchanged. Vascular: Calcification of the cavernous internal carotid arteries consistent with cerebrovascular atherosclerotic disease. No signs of intracranial large vessel occlusion. Skull: Normal. Negative for fracture or focal lesion. Sinuses/Orbits: No acute finding. Other: None. ASPECTS [facility] Stroke Program Early CT Score) - Ganglionic level infarction (caudate, lentiform nuclei, internal capsule, insula, M1-M3 cortex): 6 - Supraganglionic infarction (M4-M6 cortex): 2 Total score (0-10 with 10 being normal): 8 IMPRESSION: 1. New areas of hypoattenuation involving LEFT frontal operculum, LEFT frontal cortex, and subcortical white matter, consistent with extension of the previously documented acute LEFT MCA territory infarct. No reperfusion hemorrhage. Unchanged RIGHT lateral ventricle subependymoma. No signs of large vessel occlusion. 2.  ASPECTS is 8. These results were called by telephone at the time of interpretation on 02/01/2018 at 10:04 am to Dr. Cheral Marker, Who verbally acknowledged these results. Electronically Signed   By: Staci Righter M.D.   On: 02/01/2018 10:05     Medical Consultants:    None.  Anti-Infectives:    Subjective:    [name] no seizures per night, he relates he feels tired and is heartening with everything that is going on.  Objective:    Vitals:   02/01/18 1702 02/01/18 1929 02/02/18 0008 02/02/18 0315  BP: 126/75 (!) 149/73 (!) 150/86 (!) 108/58  Pulse: 73 80 68 73  Resp: 20 18 20 18   Temp: 98 F (36.7 C) 98 F (36.7 C) 97.7 F (36.5 C) 97.9 F (36.6 C)  TempSrc: Oral Oral Oral Oral  SpO2: 93% 92% 98% 95%    Intake/Output Summary (Last 24 hours) at 02/02/2018 0734 Last data filed at 02/02/2018 0400 Gross per 24 hour  Intake 88.39 ml  Output -  Net 88.39 ml   There were no vitals filed for this visit.  Exam: General exam: In no acute distress. Respiratory system: Good air movement and clear to auscultation. Cardiovascular system: S1 & S2 heard, RRR.  Gastrointestinal system: Abdomen is nondistended, soft and nontender.  Central nervous system: Alert and oriented. No focal neurological deficits. Extremities: No pedal edema. Skin: No rashes, lesions or ulcers Psychiatry: Judgement and insight appear normal. Mood & affect appropriate.    Data Reviewed:    Labs: Basic Metabolic Panel: Recent Labs  Lab 01/29/18 1220 01/29/18 1230 02/01/18 0943 02/01/18 0947  NA 140 138 138 138  K 4.6 4.3 4.1 4.0  CL 99 99 103 102  CO2  29  --  26  --   GLUCOSE 160* 159* 141* 139*  BUN 9 11 11 13   CREATININE 1.11 1.10 1.12 1.00  CALCIUM 9.6  --  8.8*  --    GFR Estimated Creatinine Clearance: 87 mL/min (by C-G formula based on SCr of 1 mg/dL). Liver Function Tests: Recent Labs  Lab 01/29/18 1220 02/01/18 0943  AST 23 23  ALT 15 21  ALKPHOS 63 51  BILITOT 1.1 0.8    PROT 7.0 6.6  ALBUMIN 3.7 3.4*   No results for input(s): LIPASE, AMYLASE in the last 168 hours. No results for input(s): AMMONIA in the last 168 hours. Coagulation profile Recent Labs  Lab 01/29/18 1220 02/01/18 0943  INR 1.03 1.00    CBC: Recent Labs  Lab 01/29/18 1220 01/29/18 1230 02/01/18 0943 02/01/18 0947  WBC 9.4  --  8.9  --   NEUTROABS 6.1  --  5.4  --   HGB 17.0 18.0* 16.1 16.7  HCT 53.5* 53.0* 50.5 49.0  MCV 97.3  --  97.5  --   PLT 201  --  176  --    Cardiac Enzymes: No results for input(s): CKTOTAL, CKMB, CKMBINDEX, TROPONINI in the last 168 hours. BNP (last 3 results) No results for input(s): PROBNP in the last 8760 hours. CBG: Recent Labs  Lab 01/29/18 1222 01/30/18 0924  GLUCAP 148* 155*   D-Dimer: No results for input(s): DDIMER in the last 72 hours. Hgb A1c: No results for input(s): HGBA1C in the last 72 hours. Lipid Profile: No results for input(s): CHOL, HDL, LDLCALC, TRIG, CHOLHDL, LDLDIRECT in the last 72 hours. Thyroid function studies: No results for input(s): TSH, T4TOTAL, T3FREE, THYROIDAB in the last 72 hours.  Invalid input(s): FREET3 Anemia work up: No results for input(s): VITAMINB12, FOLATE, FERRITIN, TIBC, IRON, RETICCTPCT in the last 72 hours. Sepsis Labs: Recent Labs  Lab 01/29/18 1220 02/01/18 0943  WBC 9.4 8.9   Microbiology No results found for this or any previous visit (from the past 240 hour(s)).   Medications:   . aspirin EC  81 mg Oral Daily  . atorvastatin  20 mg Oral q1800  . clopidogrel  75 mg Oral Daily  . dutasteride  0.5 mg Oral Daily  . enoxaparin (LOVENOX) injection  40 mg Subcutaneous Q24H  . levETIRAcetam  500 mg Oral BID   Continuous Infusions:    LOS: 1 day   Charlynne Cousins  [facility] Pager [phone]  *Please refer to [email or web address], password [email or web address] to get updated schedule on who will round on this patient, as hospitalists switch teams weekly. If 7PM-7AM, please contact  night-coverage at [email or web address], password [email or web address] for any overnight needs.  02/02/2018, 7:34 AM

## 2018-02-02 NOTE — Progress Notes (Signed)
STROKE TEAM PROGRESS NOTE   HISTORY OF PRESENT ILLNESS (per record) [name] is an 72 y.o. male  With PMH HTN, CVA 01/2018 no residual deficits presented to [facility] ED with right facial droop and expressive aphasia. Code stroke was called by EDP and canceled by neurologist.   Patient woke up this morning in Eugene usual state of health and at [place]. Eugene wife noticed a sudden onset right sided facial droop. Upon further questioning it was actually that the right side of Eugene face was drawn upward and not an actual droop.  Facial abnormality resolved prior to EMS arrival. No other deficits noted. Denies any CP, weakness, numbness, tingling or vision problems. No ETOH, smoking, drug abuse. Patient was admitted to [facility] 01/29/18 and found to have an acute ischemic stroke involving Eugene left frontal operculum Was discharged on 01/30/18. He was started on plavix 75 mg daily 3 days ago and continues to take daily ASA at 81 mg.  Per wife patient does at times have a pill rolling like tremor of the left hand, which has been present for an unknown time period.  After initial exam and evaluation, neurology was called back for possible new onset seizure, described as follows: "30 second period of R facial tingling with facial jerking witnessed by NT. Pt was postictal afterwards with expressive aphasia similar to initial findings at home and in route to hospital that had previously resolved". No tongue bites or incontinence. . ED course:  CT head: New areas of hypoattenuation involving LEFT frontal operculum, LEFT frontal cortex, and subcortical white matter, consistent with extension of the previously documented acute LEFT MCA territory infarct. No reperfusion hemorrhage. Unchanged RIGHT lateral ventricle subependymoma. No signs of large vessel occlusion.  BG: 139 BP: SBP> 200 per EMS report Loaded with 1000 mg keppra  MRI 01/29/18:   1. Small acute left MCA infarcts in the frontal lobe. 2. Mild chronic small vessel  ischemic disease. 3. 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 Follow up MRI with contrast showed no enhancement of the ventricular mass, which would be atypical for subependymoma  Date last known well: Date: 02/01/2018 Time last known well: Time: 07:30 tPA Given: No: too mild to treat Modified Rankin: Rankin Score=1 NIHSS:1 (dysarthria)   SUBJECTIVE (INTERVAL HISTORY) [name] are at the bedside.   He had worsening of right-sided weakness and speech difficulties which appears to be improving. MRI does show extension of Eugene left MCA branch infarcts. Unclear whether this letter present admission is purely due to seizure or extension of stroke causing the seizure.    OBJECTIVE Vitals:   02/01/18 1929 02/02/18 0008 02/02/18 0315 02/02/18 0832  BP: (!) 149/73 (!) 150/86 (!) 108/58 (!) 148/87  Pulse: 80 68 73 72  Resp: 18 20 18 18   Temp: 98 F (36.7 C) 97.7 F (36.5 C) 97.9 F (36.6 C) 97.8 F (36.6 C)  TempSrc: Oral Oral Oral Oral  SpO2: 92% 98% 95% 97%    CBC:  Recent Labs  Lab 01/29/18 1220  02/01/18 0943 02/01/18 0947 02/02/18 0652  WBC 9.4  --  8.9  --  9.6  NEUTROABS 6.1  --  5.4  --   --   HGB 17.0   < > 16.1 16.7 15.3  HCT 53.5*   < > 50.5 49.0 47.1  MCV 97.3  --  97.5  --  96.7  PLT 201  --  176  --  155   < > =  values in this interval not displayed.    Basic Metabolic Panel:  Recent Labs  Lab 02/01/18 0943 02/01/18 0947 02/02/18 0652  NA 138 138 137  K 4.1 4.0 3.9  CL 103 102 105  CO2 26  --  25  GLUCOSE 141* 139* 106*  BUN 11 13 11   CREATININE 1.12 1.00 0.89  CALCIUM 8.8*  --  8.5*    Lipid Panel:     Component Value Date/Time   CHOL 207 (H) 01/30/2018 0347   TRIG 321 (H) 01/30/2018 0347   HDL 30 (L) 01/30/2018 0347   CHOLHDL 6.9 01/30/2018 0347   VLDL 64 (H) 01/30/2018 0347   LDLCALC 113 (H) 01/30/2018 0347   HgbA1c:  Lab Results  Component Value Date   HGBA1C 6.5 (H) 01/30/2018   Urine Drug Screen:      Component Value Date/Time   LABOPIA NONE DETECTED 01/29/2018 1228   COCAINSCRNUR NONE DETECTED 01/29/2018 1228   LABBENZ NONE DETECTED 01/29/2018 1228   AMPHETMU NONE DETECTED 01/29/2018 1228   THCU NONE DETECTED 01/29/2018 1228   LABBARB NONE DETECTED 01/29/2018 1228    Alcohol Level     Component Value Date/Time   ETH <10 02/01/2018 0943    IMAGING   Mr Brain Wo Contrast 02/01/2018 IMPRESSION:  1. Acute LEFT frontal lobe/MCA territory nonhemorrhagic infarct, propagation from prior MRI.  2. Old RIGHT basal ganglia infarct. Mild-to-moderate chronic small vessel ischemic changes.  3. Moderate parenchymal brain volume loss.  4. 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     Ct Head Code Stroke Wo Contrast 02/01/2018 IMPRESSION:  1. New areas of hypoattenuation involving LEFT frontal operculum, LEFT frontal cortex, and subcortical white matter, consistent with extension of the previously documented acute LEFT MCA territory infarct. No reperfusion hemorrhage. Unchanged RIGHT lateral ventricle subependymoma. No signs of large vessel occlusion.  2. ASPECTS is 8.      Transthoracic Echocardiogram  11/21/2017 Study Conclusions - Left ventricle: The cavity size was normal. There was severe   concentric hypertrophy. Systolic function was vigorous. The   estimated ejection fraction was in the range of 65% to 70%. Wall   motion was normal; there were no regional wall motion   abnormalities. There was an increased relative contribution of   atrial contraction to ventricular filling. Doppler parameters are   consistent with abnormal left ventricular relaxation (grade 1   diastolic dysfunction). - Aortic valve: Valve mobility was restricted. There was moderate   stenosis. Mean gradient (S): 23 mm Hg. Valve area (VTI): 1.24   cm^2. Valve area (Vmax): 1.01 cm^2. Valve area (Vmean): 1.08   cm^2. - Aorta: Aortic root dimension: 37 mm (ED). Ascending  aorta   diameter: 39 mm (ED). - Aortic root: The aortic root was mildly dilated. - Left atrium: The atrium was moderately dilated. - Pulmonic valve: There was trivial regurgitation. - Pulmonary arteries: Systolic pressure could not be accurately   estimated.    Bilateral Carotid Dopplers - pending 00/00/00   EEG 02/01/2018 Impression The EEG isnormalin awakestate only    PHYSICAL EXAM Blood pressure (!) 148/87, pulse 72, temperature 97.8 F (36.6 C), temperature source Oral, resp. rate 18, SpO2 97 %.  Obese middle aged male not in distress. . Afebrile. Head is nontraumatic. Neck is supple without bruit.    Cardiac exam no murmur or gallop. Lungs are clear to auscultation. Distal pulses are well felt.  Neurological Exam :  Awake alert nonfluent speech with mild  expressive aphagia. Good comprehension, naming and repetition. Follows commands well. Extraocular moments are full range without nystagmus. Blinks to threat bilaterally. No facial weakness. Tongue midline. Motor system exam symmetric upper and lower extremity strength without drift or focal weakness. Fine finger movements are diminished on the right. Orbits left over right upper extremity. Deep tendon reflexes symmetric. Plantars are downgoing. Gait not tested. NIH stroke scale 1 Modified Rankin at baseline 1  ASSESSMENT/PLAN [name] is a 72 y.o. male with history of a pre diabetes, prior CVA, and hypertension presenting with facial asymetry, aphasia, and possible new onset seizure activity. He did not receive IV t-PA due to mild deficits.  Stroke:  Acute LEFT frontal lobe/MCA territory infarct - embolic - unknown source.  Resultant  Mild aphasia  CT head - New areas of hypoattenuation involving LEFT frontal operculum, LEFT frontal cortex, and subcortical white matter, consistent with extension of the previously documented acute LEFT MCA territory infarct.  MRI head - Acute LEFT frontal lobe/MCA territory  nonhemorrhagic infarct, propagation from prior MRI. Old RIGHT basal ganglia infarct.   MRA head - not performed  Carotid Doppler - pending  2D Echo - 11/21/2017 - EF 65 - 70%. No cardiac source of emboli identified.   EEG - The EEG isnormalin awakestate only  LDL - 113  HgbA1c - 6.5  VTE prophylaxis - Lovenox  Diet  - Heart healthy with thin liquids.  aspirin 81 mg daily and clopidogrel 75 mg daily prior to admission, now on aspirin 81 mg daily and clopidogrel 75 mg daily  Patient counseled to be compliant with Eugene antithrombotic medications  Ongoing aggressive stroke risk factor management  Therapy recommendations:  pending  Disposition:  Pending  Hypertension  Stable  Permissive hypertension (OK if < 220/120) but gradually normalize in 5-7 days . Long-term BP goal normotensive  Hyperlipidemia  Lipid lowering medication PTA: Lipitor 20 mg daily  LDL 113, goal < 70  Current lipid lowering medication: Lipitor 20 mg daily -> increase Lipitor to 80 mg daily.  Continue statin at discharge  Diabetes  HgbA1c 6.5, goal < 7.0  Controlled  No history of diabetes - probably has pre diabetes - may need diabetes nurse educator to see pt.  Other Stroke Risk Factors  Advanced age  ETOH use, advised to drink no more than 1 alcoholic beverage per day.  Mild Obesity, There is no height or weight on file to calculate BMI., recommend weight loss, diet and exercise as appropriate   Hx stroke/TIA   Other Active Problems  Possible new onset seizure activity -> Keppra  Probable pre diabetes   Plan  TEE and Loop requested for Tuesday - (message sent to cardiology)     Hospital day # 1  I have personally examined this patient, reviewed notes, independently viewed imaging studies, participated in medical decision making and plan of care.ROS completed by me personally and pertinent positives fully documented  I have made any additions or clarifications directly  to the above note. Agree with note above.  He presented last week with left MCA branch embolic infarct and was discharged home on dual antiplatelet therapy . Therapy and presents with right-sided facial weakness and drying unclear whether partial seizure or due to extension of Eugene stroke. MRI definitely shows increase in size of Eugene stroke. Recommend continue dual antiplatelet therapy and check transesophageal echocardiogram for cardiac source of embolism and cardiac monitoring for paroxysmal A. Fib. Discuss with cardiology Dr. Crissie Sickles who feels patient's  tracing showed paroxysmal atrial tachycardia which is associated with high risk for atrial fibrillation hence 30 day heart monitor would be preferred over a loop recorder. Patient is also interested in considering perspiration in the sleep smart study and he will be given information to review and decide. He will undergo [place] 3 portable sleep monitor tonight to screen for sleep apnea for the study if he decides to participate. Discussed with patient, Anderson and wife and answered questions. Discussed with Dr. Olevia Bowens. Greater than 50% time during this 35 minute visit was spent on counseling and coordination of care about Eugene embolic stroke and answering questions Antony Contras, MD Medical Director [place] Pager: [phone] 02/02/2018 1:29 PM   To contact Stroke Continuity provider, please refer to [email or web address] After hours, contact General Neurology

## 2018-02-03 ENCOUNTER — Inpatient Hospital Stay (HOSPITAL_COMMUNITY): Payer: Medicare Other

## 2018-02-03 ENCOUNTER — Encounter (HOSPITAL_COMMUNITY)
Admission: EM | Disposition: A | Discharge status: Home Health | Payer: Self-pay | Source: Home / Self Care | Attending: Internal Medicine

## 2018-02-03 ENCOUNTER — Encounter (HOSPITAL_COMMUNITY): Payer: Self-pay | Admitting: Cardiology

## 2018-02-03 DIAGNOSIS — I351 Nonrheumatic aortic (valve) insufficiency: Secondary | ICD-10-CM

## 2018-02-03 DIAGNOSIS — I6300 Cerebral infarction due to thrombosis of unspecified precerebral artery: Secondary | ICD-10-CM

## 2018-02-03 HISTORY — PX: TEE WITHOUT CARDIOVERSION: SHX5443

## 2018-02-03 SURGERY — ECHOCARDIOGRAM, TRANSESOPHAGEAL
Anesthesia: Moderate Sedation

## 2018-02-03 MED ORDER — SALINE SPRAY 0.65 % NA SOLN
1.0000 | NASAL | Status: DC | PRN
  Administered 2018-02-03: 1 via NASAL
  Filled 2018-02-03: qty 44

## 2018-02-03 MED ORDER — MIDAZOLAM HCL 10 MG/2ML IJ SOLN
INTRAMUSCULAR | Status: DC | PRN
  Administered 2018-02-03: 1 mg via INTRAVENOUS
  Administered 2018-02-03 (×2): 2 mg via INTRAVENOUS

## 2018-02-03 MED ORDER — MIDAZOLAM HCL 5 MG/ML IJ SOLN
INTRAMUSCULAR | Status: AC
  Filled 2018-02-03: qty 2

## 2018-02-03 MED ORDER — FENTANYL CITRATE (PF) 100 MCG/2ML IJ SOLN
INTRAMUSCULAR | Status: AC
  Filled 2018-02-03: qty 2

## 2018-02-03 MED ORDER — FENTANYL CITRATE (PF) 100 MCG/2ML IJ SOLN
INTRAMUSCULAR | Status: DC | PRN
  Administered 2018-02-03: 25 ug via INTRAVENOUS

## 2018-02-03 MED ORDER — BUTAMBEN-TETRACAINE-BENZOCAINE 2-2-14 % EX AERO
INHALATION_SPRAY | CUTANEOUS | Status: DC | PRN
  Administered 2018-02-03: 2 via TOPICAL

## 2018-02-03 MED ORDER — SODIUM CHLORIDE BACTERIOSTATIC 0.9 % IJ SOLN
INTRAMUSCULAR | Status: DC | PRN
  Administered 2018-02-03: 9 mL via INTRAVENOUS

## 2018-02-03 NOTE — H&P (View-Only) (Signed)
[facility] PROGRESS NOTE    Progress Note  [name]  ZTI:[id number] DOB: 15-Jun-1945 DOA: 02/01/2018 PCP: Isaias Sakai, DO     Brief Narrative:   [name] is an 72 y.o. male past medical history significant for essential hypertension, CVA 01/29/2018 who presented to the ED today with complaint of right facial droop, expressive aphasia occult stroke was called and it was canceled as it had resolved by the time the patient was in the ED.  Afterwards she had a witnessed seizure she was loaded with Keppra  Assessment/Plan:   CVA (cerebral vascular accident) ([place]) MRI of the brain done on 02/02/2018 showed propagation of her previous stroke. We will continue her on aspirin and Plavix. Continue statins. Continue to hold antihypertensive medication. The stroke team was consulted who recommended a TEE, radiology was consulted for TEE. Probably also recommended a loop recorder which will be done as an outpatient.  Probable seizures: Seizures overnight continue Keppra twice daily.  Essential hypertension We will hold antihypertensive medication we will try to keep blood pressure above 140/80.  BPH (benign prostatic hyperplasia) Discontinue Cardura.   DVT prophylaxis: lovenox Family Communication:none Disposition Plan/Barrier to D/C: home in 1-2 days Code Status:     Code Status Orders  (From admission, onward)         Start     Ordered   02/01/18 1221  Do not attempt resuscitation (DNR)  Continuous    Question Answer Comment  In the event of cardiac or respiratory ARREST Do not call a "code blue"   In the event of cardiac or respiratory ARREST Do not perform Intubation, CPR, defibrillation or ACLS   In the event of cardiac or respiratory ARREST Use medication by any route, position, wound care, and other measures to relive pain and suffering. May use oxygen, suction and manual treatment of airway obstruction as needed for comfort.      02/01/18 1220        Code Status History    Date Active Date Inactive Code Status Order ID Comments User Context   02/01/2018 1220 02/01/2018 1220 Full Code [id number]  Janora Norlander, MD Inpatient   01/29/2018 1803 01/30/2018 1710 DNR [id number]  Karmen Bongo, MD ED        IV Access:    Peripheral IV   Procedures and diagnostic studies:   Mr Brain Wo Contrast  Result Date: 02/01/2018 CLINICAL DATA:  Worsening expressive aphasia, RIGHT facial droop. History of hypertension and stroke. EXAM: MRI HEAD WITHOUT CONTRAST TECHNIQUE: Multiplanar, multiecho pulse sequences of the brain and surrounding structures were obtained without intravenous contrast. COMPARISON:  CT HEAD February 01, 2018 and MRI head January 29, 2018. FINDINGS: INTRACRANIAL CONTENTS: Patchy reduced diffusion LEFT frontal lobe extending to operculum increased in size from prior examination with low ADC values. No susceptibility artifact to suggest hemorrhage. Old RIGHT basal ganglia infarct. Patchy supratentorial and pontine white matter FLAIR T2 hyperintensities exclusive aforementioned abnormality. Old pontine lacunar infarct. Moderate parenchymal brain volume loss. Stable appearance of low T2, FLAIR T2 hyperintense 12 mm RIGHT lateral ventricle mass most compatible with subependymoma. VASCULAR: Normal major intracranial vascular flow voids present at skull base. SKULL AND UPPER CERVICAL SPINE: No abnormal sellar expansion. No suspicious calvarial bone marrow signal. Craniocervical junction maintained. SINUSES/ORBITS: LEFT maxillary mucosal retention cyst. Mild paranasal sinus mucosal thickening. Mastoid air cells are well aerated. Included ocular globes and orbital contents are non-suspicious. OTHER: None. IMPRESSION: 1. Acute LEFT frontal lobe/MCA territory nonhemorrhagic infarct,  propagation from prior MRI. 2. Old RIGHT basal ganglia infarct. Mild-to-moderate chronic small vessel ischemic changes. 3. Moderate parenchymal brain volume loss.  4. 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 Electronically Signed   By: Elon Alas M.D.   On: 02/01/2018 23:54   Ct Head Code Stroke Wo Contrast  Result Date: 02/01/2018 CLINICAL DATA:  Code stroke. Awoke with increased RIGHT-sided facial droop, and expressive aphasia. Atrial fibrillation. EXAM: CT HEAD WITHOUT CONTRAST TECHNIQUE: Contiguous axial images were obtained from the base of the skull through the vertex without intravenous contrast. COMPARISON:  CT head 01/29/2018. MR head 01/29/2018. CTA head neck 01/29/2018. FINDINGS: Brain: New areas of hypoattenuation involving the LEFT frontal operculum, LEFT frontal cortex, and subcortical white matter compared with CT and MR 3 days ago consistent with extension of the previous LEFT MCA territory infarct. No hemorrhage, intra-axial mass lesion, hydrocephalus, or extra-axial fluid. Unchanged RIGHT lateral ventricle subependymoma. Chronic RIGHT basal ganglia infarct unchanged. Vascular: Calcification of the cavernous internal carotid arteries consistent with cerebrovascular atherosclerotic disease. No signs of intracranial large vessel occlusion. Skull: Normal. Negative for fracture or focal lesion. Sinuses/Orbits: No acute finding. Other: None. ASPECTS [facility] Stroke Program Early CT Score) - Ganglionic level infarction (caudate, lentiform nuclei, internal capsule, insula, M1-M3 cortex): 6 - Supraganglionic infarction (M4-M6 cortex): 2 Total score (0-10 with 10 being normal): 8 IMPRESSION: 1. New areas of hypoattenuation involving LEFT frontal operculum, LEFT frontal cortex, and subcortical white matter, consistent with extension of the previously documented acute LEFT MCA territory infarct. No reperfusion hemorrhage. Unchanged RIGHT lateral ventricle subependymoma. No signs of large vessel occlusion. 2. ASPECTS is 8. These results were called by telephone at the time of interpretation on 02/01/2018 at 10:04 am to Dr.  Cheral Marker, Who verbally acknowledged these results. Electronically Signed   By: Staci Righter M.D.   On: 02/01/2018 10:05     Medical Consultants:    None.  Anti-Infectives:    Subjective:    [name] anxious tolerated his diet  Objective:    Vitals:   02/02/18 1952 02/02/18 2330 02/03/18 0328 02/03/18 0738  BP: (!) 145/86 (!) 153/81 (!) 142/89 (!) 176/83  Pulse: 71 66 68 74  Resp: 18 18 18 20   Temp: 98.4 F (36.9 C) 98.7 F (37.1 C) 98.7 F (37.1 C) 97.8 F (36.6 C)  TempSrc: Oral Oral Oral Oral  SpO2: 94% 96% 100% 94%    Intake/Output Summary (Last 24 hours) at 02/03/2018 0804 Last data filed at 02/03/2018 0141 Gross per 24 hour  Intake 396.09 ml  Output 1 ml  Net 395.09 ml   There were no vitals filed for this visit.  Exam: General exam: In no acute distress. Respiratory system: Good air movement and clear to auscultation. Cardiovascular system: S1 & S2 heard, RRR.  Gastrointestinal system: Abdomen is nondistended, soft and nontender.  Central nervous system: Alert and oriented. No focal neurological deficits. Extremities: No pedal edema. Skin: No rashes, lesions or ulcers Psychiatry: Judgement and insight appear normal. Mood & affect appropriate.    Data Reviewed:    Labs: Basic Metabolic Panel: Recent Labs  Lab 01/29/18 1220 01/29/18 1230 02/01/18 0943 02/01/18 0947 02/02/18 0652  NA 140 138 138 138 137  K 4.6 4.3 4.1 4.0 3.9  CL 99 99 103 102 105  CO2 29  --  26  --  25  GLUCOSE 160* 159* 141* 139* 106*  BUN 9 11 11 13 11   CREATININE 1.11 1.10 1.12  1.00 0.89  CALCIUM 9.6  --  8.8*  --  8.5*   GFR Estimated Creatinine Clearance: 97.8 mL/min (by C-G formula based on SCr of 0.89 mg/dL). Liver Function Tests: Recent Labs  Lab 01/29/18 1220 02/01/18 0943  AST 23 23  ALT 15 21  ALKPHOS 63 51  BILITOT 1.1 0.8  PROT 7.0 6.6  ALBUMIN 3.7 3.4*   No results for input(s): LIPASE, AMYLASE in the last 168 hours. No results for input(s):  AMMONIA in the last 168 hours. Coagulation profile Recent Labs  Lab 01/29/18 1220 02/01/18 0943  INR 1.03 1.00    CBC: Recent Labs  Lab 01/29/18 1220 01/29/18 1230 02/01/18 0943 02/01/18 0947 02/02/18 0652  WBC 9.4  --  8.9  --  9.6  NEUTROABS 6.1  --  5.4  --   --   HGB 17.0 18.0* 16.1 16.7 15.3  HCT 53.5* 53.0* 50.5 49.0 47.1  MCV 97.3  --  97.5  --  96.7  PLT 201  --  176  --  155   Cardiac Enzymes: No results for input(s): CKTOTAL, CKMB, CKMBINDEX, TROPONINI in the last 168 hours. BNP (last 3 results) No results for input(s): PROBNP in the last 8760 hours. CBG: Recent Labs  Lab 01/29/18 1222 01/30/18 0924  GLUCAP 148* 155*   D-Dimer: No results for input(s): DDIMER in the last 72 hours. Hgb A1c: No results for input(s): HGBA1C in the last 72 hours. Lipid Profile: No results for input(s): CHOL, HDL, LDLCALC, TRIG, CHOLHDL, LDLDIRECT in the last 72 hours. Thyroid function studies: No results for input(s): TSH, T4TOTAL, T3FREE, THYROIDAB in the last 72 hours.  Invalid input(s): FREET3 Anemia work up: No results for input(s): VITAMINB12, FOLATE, FERRITIN, TIBC, IRON, RETICCTPCT in the last 72 hours. Sepsis Labs: Recent Labs  Lab 01/29/18 1220 02/01/18 0943 02/02/18 0652  WBC 9.4 8.9 9.6   Microbiology No results found for this or any previous visit (from the past 240 hour(s)).   Medications:   . aspirin EC  81 mg Oral Daily  . atorvastatin  80 mg Oral q1800  . clopidogrel  75 mg Oral Daily  . dutasteride  0.5 mg Oral Daily  . enoxaparin (LOVENOX) injection  40 mg Subcutaneous Q24H  . levETIRAcetam  500 mg Oral BID   Continuous Infusions: . sodium chloride 20 mL/hr at 02/02/18 1550      LOS: 2 days   Charlynne Cousins  [facility] Pager [phone]  *Please refer to [email or web address], password [email or web address] to get updated schedule on who will round on this patient, as hospitalists switch teams weekly. If 7PM-7AM, please contact night-coverage at  [email or web address], password [email or web address] for any overnight needs.  02/03/2018, 8:04 AM

## 2018-02-03 NOTE — Interval H&P Note (Signed)
History and Physical Interval Note:  02/03/2018 9:43 AM  [name]  has presented today for surgery, with the diagnosis of stroke  The various methods of treatment have been discussed with the patient and family. After consideration of risks, benefits and other options for treatment, the patient has consented to  Procedure(s): TRANSESOPHAGEAL ECHOCARDIOGRAM (TEE) (N/A) as a surgical intervention .  The patient's history has been reviewed, patient examined, no change in status, stable for surgery.  I have reviewed the patient's chart and labs.  Questions were answered to the patient's satisfaction.     Kirk Ruths

## 2018-02-03 NOTE — Progress Notes (Signed)
[facility] PROGRESS NOTE    Progress Note  [name]  YKD:[id number] DOB: [birth year] DOA: 02/01/2018 PCP: Isaias Sakai, DO     Brief Narrative:   [name] is an 72 y.o. male past medical history significant for essential hypertension, CVA 01/29/2018 who presented to the ED today with complaint of right facial droop, expressive aphasia occult stroke was called and it was canceled as it had resolved by the time the patient was in the ED.  Afterwards she had a witnessed seizure she was loaded with Keppra  Assessment/Plan:   CVA (cerebral vascular accident) ([place]) MRI of the brain done on 02/02/2018 showed propagation of her previous stroke. We will continue her on aspirin and Plavix. Continue statins. Continue to hold antihypertensive medication. The stroke team was consulted who recommended a TEE, radiology was consulted for TEE. Probably also recommended a loop recorder which will be done as an outpatient.  Probable seizures: Seizures overnight continue Keppra twice daily.  Essential hypertension We will hold antihypertensive medication we will try to keep blood pressure above 140/80.  BPH (benign prostatic hyperplasia) Discontinue Cardura.   DVT prophylaxis: lovenox Family Communication:none Disposition Plan/Barrier to D/C: home in 1-2 days Code Status:     Code Status Orders  (From admission, onward)         Start     Ordered   02/01/18 1221  Do not attempt resuscitation (DNR)  Continuous    Question Answer Comment  In the event of cardiac or respiratory ARREST Do not call a "code blue"   In the event of cardiac or respiratory ARREST Do not perform Intubation, CPR, defibrillation or ACLS   In the event of cardiac or respiratory ARREST Use medication by any route, position, wound care, and other measures to relive pain and suffering. May use oxygen, suction and manual treatment of airway obstruction as needed for comfort.      02/01/18 1220        Code Status History    Date Active Date Inactive Code Status Order ID Comments User Context   02/01/2018 1220 02/01/2018 1220 Full Code [id number]  Janora Norlander, MD Inpatient   01/29/2018 1803 01/30/2018 1710 DNR [id number]  Karmen Bongo, MD ED        IV Access:    Peripheral IV   Procedures and diagnostic studies:   Mr Brain Wo Contrast  Result Date: 02/01/2018 CLINICAL DATA:  Worsening expressive aphasia, RIGHT facial droop. History of hypertension and stroke. EXAM: MRI HEAD WITHOUT CONTRAST TECHNIQUE: Multiplanar, multiecho pulse sequences of the brain and surrounding structures were obtained without intravenous contrast. COMPARISON:  CT HEAD February 01, 2018 and MRI head January 29, 2018. FINDINGS: INTRACRANIAL CONTENTS: Patchy reduced diffusion LEFT frontal lobe extending to operculum increased in size from prior examination with low ADC values. No susceptibility artifact to suggest hemorrhage. Old RIGHT basal ganglia infarct. Patchy supratentorial and pontine white matter FLAIR T2 hyperintensities exclusive aforementioned abnormality. Old pontine lacunar infarct. Moderate parenchymal brain volume loss. Stable appearance of low T2, FLAIR T2 hyperintense 12 mm RIGHT lateral ventricle mass most compatible with subependymoma. VASCULAR: Normal major intracranial vascular flow voids present at skull base. SKULL AND UPPER CERVICAL SPINE: No abnormal sellar expansion. No suspicious calvarial bone marrow signal. Craniocervical junction maintained. SINUSES/ORBITS: LEFT maxillary mucosal retention cyst. Mild paranasal sinus mucosal thickening. Mastoid air cells are well aerated. Included ocular globes and orbital contents are non-suspicious. OTHER: None. IMPRESSION: 1. Acute LEFT frontal lobe/MCA territory nonhemorrhagic infarct,  propagation from prior MRI. 2. Old RIGHT basal ganglia infarct. Mild-to-moderate chronic small vessel ischemic changes. 3. Moderate parenchymal brain volume loss.  4. 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 Electronically Signed   By: Elon Alas M.D.   On: 02/01/2018 23:54   Ct Head Code Stroke Wo Contrast  Result Date: 02/01/2018 CLINICAL DATA:  Code stroke. Awoke with increased RIGHT-sided facial droop, and expressive aphasia. Atrial fibrillation. EXAM: CT HEAD WITHOUT CONTRAST TECHNIQUE: Contiguous axial images were obtained from the base of the skull through the vertex without intravenous contrast. COMPARISON:  CT head 01/29/2018. MR head 01/29/2018. CTA head neck 01/29/2018. FINDINGS: Brain: New areas of hypoattenuation involving the LEFT frontal operculum, LEFT frontal cortex, and subcortical white matter compared with CT and MR 3 days ago consistent with extension of the previous LEFT MCA territory infarct. No hemorrhage, intra-axial mass lesion, hydrocephalus, or extra-axial fluid. Unchanged RIGHT lateral ventricle subependymoma. Chronic RIGHT basal ganglia infarct unchanged. Vascular: Calcification of the cavernous internal carotid arteries consistent with cerebrovascular atherosclerotic disease. No signs of intracranial large vessel occlusion. Skull: Normal. Negative for fracture or focal lesion. Sinuses/Orbits: No acute finding. Other: None. ASPECTS [facility] Stroke Program Early CT Score) - Ganglionic level infarction (caudate, lentiform nuclei, internal capsule, insula, M1-M3 cortex): 6 - Supraganglionic infarction (M4-M6 cortex): 2 Total score (0-10 with 10 being normal): 8 IMPRESSION: 1. New areas of hypoattenuation involving LEFT frontal operculum, LEFT frontal cortex, and subcortical white matter, consistent with extension of the previously documented acute LEFT MCA territory infarct. No reperfusion hemorrhage. Unchanged RIGHT lateral ventricle subependymoma. No signs of large vessel occlusion. 2. ASPECTS is 8. These results were called by telephone at the time of interpretation on 02/01/2018 at 10:04 am to Dr.  Cheral Marker, Who verbally acknowledged these results. Electronically Signed   By: Staci Righter M.D.   On: 02/01/2018 10:05     Medical Consultants:    None.  Anti-Infectives:    Subjective:    Damacio Weisgerber Quast anxious tolerated his diet  Objective:    Vitals:   02/02/18 1952 02/02/18 2330 02/03/18 0328 02/03/18 0738  BP: (!) 145/86 (!) 153/81 (!) 142/89 (!) 176/83  Pulse: 71 66 68 74  Resp: 18 18 18 20   Temp: 98.4 F (36.9 C) 98.7 F (37.1 C) 98.7 F (37.1 C) 97.8 F (36.6 C)  TempSrc: Oral Oral Oral Oral  SpO2: 94% 96% 100% 94%    Intake/Output Summary (Last 24 hours) at 02/03/2018 0804 Last data filed at 02/03/2018 0141 Gross per 24 hour  Intake 396.09 ml  Output 1 ml  Net 395.09 ml   There were no vitals filed for this visit.  Exam: General exam: In no acute distress. Respiratory system: Good air movement and clear to auscultation. Cardiovascular system: S1 & S2 heard, RRR.  Gastrointestinal system: Abdomen is nondistended, soft and nontender.  Central nervous system: Alert and oriented. No focal neurological deficits. Extremities: No pedal edema. Skin: No rashes, lesions or ulcers Psychiatry: Judgement and insight appear normal. Mood & affect appropriate.    Data Reviewed:    Labs: Basic Metabolic Panel: Recent Labs  Lab 01/29/18 1220 01/29/18 1230 02/01/18 0943 02/01/18 0947 02/02/18 0652  NA 140 138 138 138 137  K 4.6 4.3 4.1 4.0 3.9  CL 99 99 103 102 105  CO2 29  --  26  --  25  GLUCOSE 160* 159* 141* 139* 106*  BUN 9 11 11 13 11   CREATININE 1.11 1.10 1.12  1.00 0.89  CALCIUM 9.6  --  8.8*  --  8.5*   GFR Estimated Creatinine Clearance: 97.8 mL/min (by C-G formula based on SCr of 0.89 mg/dL). Liver Function Tests: Recent Labs  Lab 01/29/18 1220 02/01/18 0943  AST 23 23  ALT 15 21  ALKPHOS 63 51  BILITOT 1.1 0.8  PROT 7.0 6.6  ALBUMIN 3.7 3.4*   No results for input(s): LIPASE, AMYLASE in the last 168 hours. No results for input(s):  AMMONIA in the last 168 hours. Coagulation profile Recent Labs  Lab 01/29/18 1220 02/01/18 0943  INR 1.03 1.00    CBC: Recent Labs  Lab 01/29/18 1220 01/29/18 1230 02/01/18 0943 02/01/18 0947 02/02/18 0652  WBC 9.4  --  8.9  --  9.6  NEUTROABS 6.1  --  5.4  --   --   HGB 17.0 18.0* 16.1 16.7 15.3  HCT 53.5* 53.0* 50.5 49.0 47.1  MCV 97.3  --  97.5  --  96.7  PLT 201  --  176  --  155   Cardiac Enzymes: No results for input(s): CKTOTAL, CKMB, CKMBINDEX, TROPONINI in the last 168 hours. BNP (last 3 results) No results for input(s): PROBNP in the last 8760 hours. CBG: Recent Labs  Lab 01/29/18 1222 01/30/18 0924  GLUCAP 148* 155*   D-Dimer: No results for input(s): DDIMER in the last 72 hours. Hgb A1c: No results for input(s): HGBA1C in the last 72 hours. Lipid Profile: No results for input(s): CHOL, HDL, LDLCALC, TRIG, CHOLHDL, LDLDIRECT in the last 72 hours. Thyroid function studies: No results for input(s): TSH, T4TOTAL, T3FREE, THYROIDAB in the last 72 hours.  Invalid input(s): FREET3 Anemia work up: No results for input(s): VITAMINB12, FOLATE, FERRITIN, TIBC, IRON, RETICCTPCT in the last 72 hours. Sepsis Labs: Recent Labs  Lab 01/29/18 1220 02/01/18 0943 02/02/18 0652  WBC 9.4 8.9 9.6   Microbiology No results found for this or any previous visit (from the past 240 hour(s)).   Medications:   . aspirin EC  81 mg Oral Daily  . atorvastatin  80 mg Oral q1800  . clopidogrel  75 mg Oral Daily  . dutasteride  0.5 mg Oral Daily  . enoxaparin (LOVENOX) injection  40 mg Subcutaneous Q24H  . levETIRAcetam  500 mg Oral BID   Continuous Infusions: . sodium chloride 20 mL/hr at 02/02/18 1550      LOS: 2 days   Charlynne Cousins  [facility] Pager [phone]  *Please refer to [email or web address], password [email or web address] to get updated schedule on who will round on this patient, as hospitalists switch teams weekly. If 7PM-7AM, please contact night-coverage at  [email or web address], password [email or web address] for any overnight needs.  02/03/2018, 8:04 AM

## 2018-02-03 NOTE — Progress Notes (Signed)
*  Preliminary Results* Carotid artery duplex has been completed. Bilateral internal carotid arteries are 1-39% stenosis. Vertebral arteries are patent with antegrade flow.  02/03/2018 12:24 PM  Monish [place] Dawna Part

## 2018-02-03 NOTE — Progress Notes (Signed)
STROKE TEAM PROGRESS NOTE      SUBJECTIVE (INTERVAL HISTORY) His  Wife  Is at the bedside.   He states hisf right-sided weakness and speech difficulties   appears to be improving. He he is participating in the sleep smart study and tolerated the Knox 3 portable overnight study for sleep apnea and tested positive and will have the CPAP titration tolerabilitystudy tonight.. Transesophageal echocardiogram this morning showed no cardiac source of embolism or PFO. He had moderate calcific aortic stenosis. Had has no cardiologist and will need a consult   OBJECTIVE Vitals:   02/02/18 1952 02/02/18 2330 02/03/18 0328 02/03/18 0738  BP: (!) 145/86 (!) 153/81 (!) 142/89 (!) 176/83  Pulse: 71 66 68 74  Resp: 18 18 18 20   Temp: 98.4 F (36.9 C) 98.7 F (37.1 C) 98.7 F (37.1 C) 97.8 F (36.6 C)  TempSrc: Oral Oral Oral Oral  SpO2: 94% 96% 100% 94%    CBC:  Recent Labs  Lab 01/29/18 1220  02/01/18 0943 02/01/18 0947 02/02/18 0652  WBC 9.4  --  8.9  --  9.6  NEUTROABS 6.1  --  5.4  --   --   HGB 17.0   < > 16.1 16.7 15.3  HCT 53.5*   < > 50.5 49.0 47.1  MCV 97.3  --  97.5  --  96.7  PLT 201  --  176  --  155   < > = values in this interval not displayed.    Basic Metabolic Panel:  Recent Labs  Lab 02/01/18 0943 02/01/18 0947 02/02/18 0652  NA 138 138 137  K 4.1 4.0 3.9  CL 103 102 105  CO2 26  --  25  GLUCOSE 141* 139* 106*  BUN 11 13 11   CREATININE 1.12 1.00 0.89  CALCIUM 8.8*  --  8.5*    Lipid Panel:     Component Value Date/Time   CHOL 207 (H) 01/30/2018 0347   TRIG 321 (H) 01/30/2018 0347   HDL 30 (L) 01/30/2018 0347   CHOLHDL 6.9 01/30/2018 0347   VLDL 64 (H) 01/30/2018 0347   LDLCALC 113 (H) 01/30/2018 0347   HgbA1c:  Lab Results  Component Value Date   HGBA1C 6.5 (H) 01/30/2018   Urine Drug Screen:     Component Value Date/Time   LABOPIA NONE DETECTED 01/29/2018 1228   COCAINSCRNUR NONE DETECTED 01/29/2018 1228   LABBENZ NONE DETECTED 01/29/2018  1228   AMPHETMU NONE DETECTED 01/29/2018 1228   THCU NONE DETECTED 01/29/2018 1228   LABBARB NONE DETECTED 01/29/2018 1228    Alcohol Level     Component Value Date/Time   ETH <10 02/01/2018 0943    IMAGING   Mr Brain Wo Contrast 02/01/2018 IMPRESSION:  1. Acute LEFT frontal lobe/MCA territory nonhemorrhagic infarct, propagation from prior MRI.  2. Old RIGHT basal ganglia infarct. Mild-to-moderate chronic small vessel ischemic changes.  3. Moderate parenchymal brain volume loss.  4. 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     Ct Head Code Stroke Wo Contrast 02/01/2018 IMPRESSION:  1. New areas of hypoattenuation involving LEFT frontal operculum, LEFT frontal cortex, and subcortical white matter, consistent with extension of the previously documented acute LEFT MCA territory infarct. No reperfusion hemorrhage. Unchanged RIGHT lateral ventricle subependymoma. No signs of large vessel occlusion.  2. ASPECTS is 8.      Transthoracic Echocardiogram  11/21/2017 Study Conclusions - Left ventricle: The cavity size was normal. There was severe  concentric hypertrophy. Systolic function was vigorous. The   estimated ejection fraction was in the range of 65% to 70%. Wall   motion was normal; there were no regional wall motion   abnormalities. There was an increased relative contribution of   atrial contraction to ventricular filling. Doppler parameters are   consistent with abnormal left ventricular relaxation (grade 1   diastolic dysfunction). - Aortic valve: Valve mobility was restricted. There was moderate   stenosis. Mean gradient (S): 23 mm Hg. Valve area (VTI): 1.24   cm^2. Valve area (Vmax): 1.01 cm^2. Valve area (Vmean): 1.08   cm^2. - Aorta: Aortic root dimension: 37 mm (ED). Ascending aorta   diameter: 39 mm (ED). - Aortic root: The aortic root was mildly dilated. - Left atrium: The atrium was moderately dilated. - Pulmonic valve:  There was trivial regurgitation. - Pulmonary arteries: Systolic pressure could not be accurately   estimated.    Bilateral Carotid Dopplers -  Bilateral 1-39 percent carotid stenosis  EEG 02/01/2018 Impression The EEG isnormalin awakestate only    PHYSICAL EXAM Blood pressure (!) 176/83, pulse 74, temperature 97.8 F (36.6 C), temperature source Oral, resp. rate 20, SpO2 94 %.  Obese middle aged male not in distress. . Afebrile. Head is nontraumatic. Neck is supple without bruit.    Cardiac exam no murmur or gallop. Lungs are clear to auscultation. Distal pulses are well felt.  Neurological Exam :  Awake alert nonfluent speech with mild expressive aphasia. Good comprehension, naming and repetition. Follows commands well. Extraocular moments are full range without nystagmus. Blinks to threat bilaterally. No facial weakness. Tongue midline. Motor system exam symmetric upper and lower extremity strength without drift or focal weakness. Fine finger movements are diminished on the right. Orbits left over right upper extremity. Deep tendon reflexes symmetric. Plantars are downgoing. Gait not tested.  NIH stroke scale 1 Modified Rankin at baseline 1  ASSESSMENT/PLAN [name] is a 72 y.o. male with history of a pre diabetes, prior CVA, and hypertension presenting with facial asymetry, aphasia, and possible new onset seizure activity. He did not receive IV t-PA due to mild deficits.  Stroke:  Acute LEFT frontal lobe/MCA territory infarct - embolic - unknown source.  Resultant  Mild aphasia  CT head - New areas of hypoattenuation involving LEFT frontal operculum, LEFT frontal cortex, and subcortical white matter, consistent with extension of the previously documented acute LEFT MCA territory infarct.  MRI head - Acute LEFT frontal lobe/MCA territory nonhemorrhagic infarct, propagation from prior MRI. Old RIGHT basal ganglia infarct.   MRA head - not performed  Carotid Doppler  - pending  2D Echo - 11/21/2017 - EF 65 - 70%. No cardiac source of emboli identified.   EEG - The EEG isnormalin awakestate only  LDL - 113  HgbA1c - 6.5  VTE prophylaxis - Lovenox  Diet  - Heart healthy with thin liquids.  aspirin 81 mg daily and clopidogrel 75 mg daily prior to admission, now on aspirin 81 mg daily and clopidogrel 75 mg daily  Patient counseled to be compliant with his antithrombotic medications  Ongoing aggressive stroke risk factor management  Therapy recommendations:  pending  Disposition:  Pending  Hypertension  Stable  Permissive hypertension (OK if < 220/120) but gradually normalize in 5-7 days . Long-term BP goal normotensive  Hyperlipidemia  Lipid lowering medication PTA: Lipitor 20 mg daily  LDL 113, goal < 70  Current lipid lowering medication: Lipitor 20 mg daily -> increase Lipitor to  80 mg daily.  Continue statin at discharge  Diabetes  HgbA1c 6.5, goal < 7.0  Controlled  No history of diabetes - probably has pre diabetes - may need diabetes nurse educator to see pt.  Other Stroke Risk Factors  Advanced age  ETOH use, advised to drink no more than 1 alcoholic beverage per day.  Mild Obesity, There is no height or weight on file to calculate BMI., recommend weight loss, diet and exercise as appropriate   Hx stroke/TIA   Other Active Problems  Possible new onset seizure activity -> Keppra  Probable pre diabetes   Plan CPAP tolerability study as part of sleep smart stroke study tonight. Cardiology consult for aortic stenosis and to arrange 30 day outpatient telemetry monitoring for paroxysmal A. Fib. Long discussion with the patient, wife at the bedside and answered questions. Discussed with Dr. Venetia Constable [facility] day # 2  I have personally examined this patient, reviewed notes, independently viewed imaging studies, participated in medical decision making and plan of care.ROS completed by me personally  and pertinent positives fully documented  I have made any additions or clarifications directly to the above note. Agree with note above.  He presented last week with left MCA branch embolic infarct and was discharged home on dual antiplatelet therapy . Therapy and presents with right-sided facial weakness and drying unclear whether partial seizure or due to extension of his stroke. MRI definitely shows increase in size of his stroke. Recommend continue dual antiplatelet therapy   Discussed with cardiology Dr. Crissie Sickles who feels patient's tracing showed paroxysmal atrial tachycardia which is associated with high risk for atrial fibrillation hence 30 day heart monitor would be preferred over a loop recorder. Patient is participating in the sleep smart study and passed  Knox 3 portable sleep monitor last night to screen for sleep apnea and will get CPAp device tolerability study tonight.Discussed with patient, son and wife and answered questions. Discussed with Dr. Olevia Bowens. Greater than 50% time during this 25 minute visit was spent on counseling and coordination of care about his embolic stroke and answering questions  Antony Contras, MD  To contact Stroke Continuity provider, please refer to [email or web address] After hours, contact General Neurology

## 2018-02-03 NOTE — CV Procedure (Addendum)
    Transesophageal Echocardiogram Note  DAVIDSON PALMIERI [id number] Jul 24, 1945  Procedure: Transesophageal Echocardiogram Indications: CVA  Procedure Details Consent: Obtained Time Out: Verified patient identification, verified procedure, site/side was marked, verified correct patient position, special equipment/implants available, Radiology Safety Procedures followed,  medications/allergies/relevent history reviewed, required imaging and test results available.  Performed  Medications:  During this procedure the patient is administered a total of Versed 5 mg and Fentanyl 25 mcg  to achieve and maintain moderate conscious sedation.  The patient's heart rate, blood pressure, and oxygen saturation are monitored continuously during the procedure. The period of conscious sedation is 30 minutes, of which I was present face-to-face 100% of this time.  Normal LV function; calcified aortic valve with reduced cusp excursion; moderate AS (mean gradient 36 mmHg and peak velocity 3.7 m/s by transthoracic; AVA 1.1 cm2 by planimetry) and mild AI; negative saline microcavitation study.    Complications: No apparent complications Patient did tolerate procedure well.  Kirk Ruths, MD

## 2018-02-03 NOTE — Progress Notes (Signed)
  Echocardiogram Echocardiogram Transesophageal has been performed.  [name] 02/03/2018, 10:39 AM

## 2018-02-03 NOTE — Progress Notes (Signed)
OT Cancellation Note  Patient Details Name: [name] MRN: [id number] DOB: Sep 26, 1945   Cancelled Treatment:    Reason Eval/Treat Not Completed: Patient at procedure or test/ unavailable, will follow up as schedule permits.  Lou Cal, OT [facility] Pager [phone] Office [phone]   Raymondo Band 02/03/2018, 10:25 AM

## 2018-02-04 ENCOUNTER — Telehealth: Payer: Self-pay | Admitting: Physician Assistant

## 2018-02-04 ENCOUNTER — Encounter (HOSPITAL_COMMUNITY): Payer: Self-pay | Admitting: Physician Assistant

## 2018-02-04 DIAGNOSIS — E785 Hyperlipidemia, unspecified: Secondary | ICD-10-CM

## 2018-02-04 DIAGNOSIS — R4701 Aphasia: Secondary | ICD-10-CM

## 2018-02-04 DIAGNOSIS — N400 Enlarged prostate without lower urinary tract symptoms: Secondary | ICD-10-CM

## 2018-02-04 DIAGNOSIS — I10 Essential (primary) hypertension: Secondary | ICD-10-CM

## 2018-02-04 DIAGNOSIS — I350 Nonrheumatic aortic (valve) stenosis: Secondary | ICD-10-CM

## 2018-02-04 DIAGNOSIS — I208 Other forms of angina pectoris: Secondary | ICD-10-CM

## 2018-02-04 DIAGNOSIS — R0609 Other forms of dyspnea: Secondary | ICD-10-CM

## 2018-02-04 MED ORDER — ATORVASTATIN CALCIUM 80 MG PO TABS: 80.0000 mg | ORAL_TABLET | Freq: Every day | ORAL | 2 refills | Status: DC

## 2018-02-04 MED ORDER — LEVETIRACETAM 500 MG PO TABS: 500.0000 mg | ORAL_TABLET | Freq: Two times a day (BID) | ORAL | 1 refills | Status: DC

## 2018-02-04 MED ORDER — ASPIRIN 81 MG PO TBEC: 81.0000 mg | DELAYED_RELEASE_TABLET | Freq: Every day | ORAL | 3 refills | Status: AC

## 2018-02-04 NOTE — Progress Notes (Signed)
[facility] PROGRESS NOTE    Progress Note  [name]  FGH:[id number] DOB: 1946-02-14 DOA: 02/01/2018 PCP: Isaias Sakai, DO     Brief Narrative:   [name] is an 72 y.o. male past medical history significant for essential hypertension, moderate obesity, moderate aortic stenosis, TIA that occurred on 01/29/2018 and the patient was discharged from the hospital the following day.  Patient presented back to the ER on 02/01/2018 with complaints of right facial droop, expressive aphasia and significant word finding problems.  Patient is also noted to have new onset seizures.  No motor weakness reported.  Patient is currently on Keppra, aspirin, Plavix and Lipitor.  Neurology and cardiology input is appreciated.  For 30-day Holter monitor on discharge.  Patient will be discharged once cleared for discharge by the neurology team.  Assessment/Plan:   CVA (cerebral vascular accident) [facility]) MRI of the brain done on 02/02/2018 showed propagation of her previous stroke. We will continue her on aspirin and Plavix. Continue statins. Continue permissive hypertension.  Antihypertensives are currently on hold.   -TEE revealed "Normal LV function; mild LAE; calcified aortic valve with   moderate AS (AVA by planimetry 1.1 cm2; mean gradient 36   mmHg/peak velocity 3.7 m/s by transthoracic imaging); mild AI;   negative saline microcavitation study". -30-day Holter monitor on discharge.  Probable seizures: No seizures overnight continue Keppra twice daily.  Essential hypertension We will hold antihypertensive medication we will try to keep blood pressure above 140/36mmHg.  Dyslipidemia: Continue Lipitor.  BPH (benign prostatic hyperplasia)   DVT prophylaxis: lovenox Family Communication:none Disposition Plan/Barrier to D/C: home once cleared for discharge by the neurology team. Code Status:     Code Status Orders  (From admission, onward)         Start     Ordered   02/01/18 1221  Do not attempt resuscitation (DNR)  Continuous    Question Answer Comment  In the event of cardiac or respiratory ARREST Do not call a "code blue"   In the event of cardiac or respiratory ARREST Do not perform Intubation, CPR, defibrillation or ACLS   In the event of cardiac or respiratory ARREST Use medication by any route, position, wound care, and other measures to relive pain and suffering. May use oxygen, suction and manual treatment of airway obstruction as needed for comfort.      02/01/18 1220        Code Status History    Date Active Date Inactive Code Status Order ID Comments User Context   02/01/2018 1220 02/01/2018 1220 Full Code [id number]  Janora Norlander, MD Inpatient   01/29/2018 1803 01/30/2018 1710 DNR [id number]  Karmen Bongo, MD ED        IV Access:    Peripheral IV   Procedures and diagnostic studies:   No results found.   Medical Consultants:    None.  Anti-Infectives:    Subjective:   No new complaints. Word finding problems persist. Expressive dysarthria persists. No motor weakness.  Objective:    Vitals:   02/03/18 1928 02/03/18 2334 02/04/18 0356 02/04/18 0736  BP: (!) 147/79 134/84 (!) 171/65 (!) 140/92  Pulse: 65 65 (!) 58 61  Resp: 18 18 18 15   Temp: 98.4 F (36.9 C) (!) 97.4 F (36.3 C) 98 F (36.7 C) 98 F (36.7 C)  TempSrc: Oral Axillary Axillary Oral  SpO2: 95% 100% 96% 96%    Intake/Output Summary (Last 24 hours) at 02/04/2018 7782 Last data filed  at 02/03/2018 2030 Gross per 24 hour  Intake 220 ml  Output -  Net 220 ml   There were no vitals filed for this visit.  Exam: General exam: In no acute distress. Respiratory system: Good air movement and clear to auscultation. Cardiovascular system: S1 & S2, systolic murmur.   Gastrointestinal system: Abdomen is nondistended, soft and nontender.  Central nervous system: Alert and oriented.  Word finding problems.  Expressive dysphasia. Extremities: No  pedal edema. Psychiatry: Judgement and insight appear normal. Mood & affect appropriate.    Data Reviewed:    Labs: Basic Metabolic Panel: Recent Labs  Lab 01/29/18 1220 01/29/18 1230 02/01/18 0943 02/01/18 0947 02/02/18 0652  NA 140 138 138 138 137  K 4.6 4.3 4.1 4.0 3.9  CL 99 99 103 102 105  CO2 29  --  26  --  25  GLUCOSE 160* 159* 141* 139* 106*  BUN 9 11 11 13 11   CREATININE 1.11 1.10 1.12 1.00 0.89  CALCIUM 9.6  --  8.8*  --  8.5*   GFR Estimated Creatinine Clearance: 97.8 mL/min (by C-G formula based on SCr of 0.89 mg/dL). Liver Function Tests: Recent Labs  Lab 01/29/18 1220 02/01/18 0943  AST 23 23  ALT 15 21  ALKPHOS 63 51  BILITOT 1.1 0.8  PROT 7.0 6.6  ALBUMIN 3.7 3.4*   No results for input(s): LIPASE, AMYLASE in the last 168 hours. No results for input(s): AMMONIA in the last 168 hours. Coagulation profile Recent Labs  Lab 01/29/18 1220 02/01/18 0943  INR 1.03 1.00    CBC: Recent Labs  Lab 01/29/18 1220 01/29/18 1230 02/01/18 0943 02/01/18 0947 02/02/18 0652  WBC 9.4  --  8.9  --  9.6  NEUTROABS 6.1  --  5.4  --   --   HGB 17.0 18.0* 16.1 16.7 15.3  HCT 53.5* 53.0* 50.5 49.0 47.1  MCV 97.3  --  97.5  --  96.7  PLT 201  --  176  --  155   Cardiac Enzymes: No results for input(s): CKTOTAL, CKMB, CKMBINDEX, TROPONINI in the last 168 hours. BNP (last 3 results) No results for input(s): PROBNP in the last 8760 hours. CBG: Recent Labs  Lab 01/29/18 1222 01/30/18 0924  GLUCAP 148* 155*   D-Dimer: No results for input(s): DDIMER in the last 72 hours. Hgb A1c: No results for input(s): HGBA1C in the last 72 hours. Lipid Profile: No results for input(s): CHOL, HDL, LDLCALC, TRIG, CHOLHDL, LDLDIRECT in the last 72 hours. Thyroid function studies: No results for input(s): TSH, T4TOTAL, T3FREE, THYROIDAB in the last 72 hours.  Invalid input(s): FREET3 Anemia work up: No results for input(s): VITAMINB12, FOLATE, FERRITIN, TIBC,  IRON, RETICCTPCT in the last 72 hours. Sepsis Labs: Recent Labs  Lab 01/29/18 1220 02/01/18 0943 02/02/18 0652  WBC 9.4 8.9 9.6   Microbiology No results found for this or any previous visit (from the past 240 hour(s)).   Medications:   . aspirin EC  81 mg Oral Daily  . atorvastatin  80 mg Oral q1800  . clopidogrel  75 mg Oral Daily  . dutasteride  0.5 mg Oral Daily  . enoxaparin (LOVENOX) injection  40 mg Subcutaneous Q24H  . levETIRAcetam  500 mg Oral BID   Continuous Infusions:     LOS: 3 days   Bonnell Public, M.D.  [facility] Pager [phone].  *Please refer to [email or web address], password [email or web address] to get updated schedule on who will  round on this patient, as hospitalists switch teams weekly. If 7PM-7AM, please contact night-coverage at [email or web address], password [email or web address] for any overnight needs.  02/04/2018, 9:36 AM

## 2018-02-04 NOTE — Telephone Encounter (Signed)
New message   Per Rosaria Ferries [facility] Appointment scheduled with Ermalinda Barrios on 02/24/2018 at 12:00pm.

## 2018-02-04 NOTE — Discharge Summary (Signed)
Physician Discharge Summary  Patient ID: DAKWAN PRIDGEN MRN: [id number] DOB/AGE: [birth year] 72 y.o.  Admit date: 02/01/2018 Discharge date: 02/04/2018  Admission Diagnoses:  Discharge Diagnoses:  Principal Problem:   CVA (cerebral vascular accident) [facility]) Active Problems:   Essential hypertension   BPH (benign prostatic hyperplasia)   Observed seizure-like activity ([place])   Aphasia   Discharged Condition: stable  Hospital Course:  JEET SHOUGH is a 72 year old male, with past medical history significant for essential hypertension, moderate obesity, moderate aortic stenosis, TIA that occurred on 01/29/2018 and the patient was discharged from the hospital the following day.  Patient presented back to the ER on 02/01/2018 with complaints of right facial droop, expressive aphasia and significant word finding problems.  Patient is also noted to have new onset seizures.  No motor weakness reported.  Patient is currently on Keppra, aspirin, Plavix and Lipitor.  Neurology and cardiology input is appreciated.  For 30-day Holter monitor on discharge.    CVA (cerebral vascular accident) [facility]) MRI of the brain done on 02/02/2018 showed propagation of her previous stroke. Patient was continued on aspirin and Plavix.   Continued statins. Permissive hypertension during the hospital stay.  Cautiously controlled blood pressure.   -TEE revealed "Normal LV function; mild LAE; calcified aortic valve with moderate AS (AVA by planimetry 1.1 cm2; mean gradient 36 mmHg/peak velocity 3.7 m/s by transthoracic imaging); mild AI; negative saline microcavitation study". -30-day Holter monitor on discharge.  Probable seizures: Seizure was reported prior to admission, and during the hospital stay.   Patient will be discharged back home on Keppra.   Seizure precautions.    Essential hypertension Cautiously optimize.    Dyslipidemia: Continue Lipitor.  BPH (benign prostatic hyperplasia)  Consults:  cardiology and neurology  Significant Diagnostic Studies:  MRI brain revealed "acute LEFT frontal lobe/MCA territory nonhemorrhagic infarct, propagation from prior MRI. 2. Old RIGHT basal ganglia infarct. Mild-to-moderate chronic small vessel ischemic changes. 3. Moderate parenchymal brain volume loss. 4. Stable 12 mm RIGHT lateral ventricle mass most compatible with Subependymoma".  Doppler ultrasound of the carotids revealed "Right Carotid: Velocities in the right ICA are consistent with a 1-39% stenosis. Left Carotid: Velocities in the left ICA are consistent with a 1-39% stenosis. Vertebrals: Bilateral vertebral arteries demonstrate antegrade flow".  TEE revealed "Left ventricle: Systolic function was normal. The estimated   ejection fraction was in the range of 55% to 60%. Wall motion was   normal; there were no regional wall motion abnormalities. - Aortic valve: Valve mobility was restricted. There was moderate   stenosis. There was mild regurgitation. - Mitral valve: No evidence of vegetation. - Left atrium: The atrium was mildly dilated. No evidence of   thrombus in the atrial cavity or appendage. No evidence of   thrombus in the atrial cavity or appendage. - Right ventricle: The cavity size was normal. Wall thickness was   increased. - Atrial septum: No defect or patent foramen ovale was identified. - Tricuspid valve: No evidence of vegetation. - Pulmonic valve: No evidence of vegetation. - Pericardium, extracardiac: A trivial pericardial effusion was   identified".   Discharge Exam: Blood pressure 137/81, pulse 66, temperature 98.1 F (36.7 C), temperature source Oral, resp. rate 18, SpO2 97 %.   Disposition: Discharge disposition: 01-Home or Self Care   Discharge Instructions    Ambulatory referral to Physical Therapy   Complete by:  As directed    Ambulatory referral to Speech Therapy   Complete by:  As directed  Diet - low sodium heart healthy   Complete  by:  As directed    Discharge instructions   Complete by:  As directed    Seizure precaution -please do not drive until cleared by your primary care provider or neurologist.  Avoid climbing heights.   For home use only DME continuous positive airway pressure (CPAP)   Complete by:  As directed    As per research protocol   Patient has OSA or probable OSA:  Yes   Settings:  Other see comments   CPAP supplies needed:  Mask, headgear, cushions, filters, heated tubing and water chamber   Increase activity slowly   Complete by:  As directed      Allergies as of 02/04/2018   No Known Allergies     Medication List    STOP taking these medications   finasteride 5 MG tablet Commonly known as:  PROSCAR   PREPARATION H 1 % Generic drug:  hydrocortisone cream     TAKE these medications   aspirin 81 MG EC tablet Take 1 tablet (81 mg total) by mouth daily. Start taking on:  02/05/2018 What changed:  when to take this   atorvastatin 80 MG tablet Commonly known as:  LIPITOR Take 1 tablet (80 mg total) by mouth daily at 6 PM. What changed:    medication strength  how much to take   clopidogrel 75 MG tablet Commonly known as:  PLAVIX Take 1 tablet (75 mg total) by mouth daily.   doxazosin 4 MG tablet Commonly known as:  CARDURA Take 4 mg by mouth at bedtime.   dutasteride 0.5 MG capsule Commonly known as:  AVODART Take 0.5 mg by mouth daily.   HYDROcodone-acetaminophen 5-325 MG tablet Commonly known as:  NORCO/VICODIN Take 1 tablet by mouth daily as needed (for pain).   hydrocortisone 25 MG suppository Commonly known as:  ANUSOL-HC Place 25 mg rectally See admin instructions. UNWRAP AND INSERT 1 SUPPOSITORY RECTALLY TWICE DAILY AS NEEDED FOR HEMORRHOID SYMPTOMS   levETIRAcetam 500 MG tablet Commonly known as:  KEPPRA Take 1 tablet (500 mg total) by mouth 2 (two) times daily.   losartan 25 MG tablet Commonly known as:  COZAAR Take 25 mg by mouth daily.             Durable Medical Equipment  (From admission, onward)         Start     Ordered   02/04/18 0000  For home use only DME continuous positive airway pressure (CPAP)    Comments:  As per research protocol  Question Answer Comment  Patient has OSA or probable OSA Yes   Settings Other see comments   CPAP supplies needed Mask, headgear, cushions, filters, heated tubing and water chamber      02/04/18 1515         Follow-up [facility] Follow up on 02/11/2019.   Specialty:  Cardiology Why:  at 2:30 pm to obtain event monitor  Contact information: [address] [facility] Outpatient therapy Follow up.   Why:  They will contact you for the first appointment Contact information: [place], [place], [place] [address]  [phone]          Time spent 32 minutes  Signed: Bonnell Public 02/04/2018, 3:16 PM

## 2018-02-04 NOTE — Consult Note (Addendum)
Cardiology Consultation:   Patient ID: [name]; [id number]; 1945/09/21   Admit date: 02/01/2018 Date of Consult: 02/04/2018  Primary Care Provider: Isaias Sakai, DO Primary Cardiologist: New Primary Electrophysiologist:  n/a   Patient Profile:   [name] is a 72 y.o. male with a hx of HTN, rectal bleeding, obesity, being seen today for the evaluation of mod AS and need for event monitor at the request of  Dr Marthenia Rolling.  History of Present Illness:   [name] was admitted 09/26-09/27/2019 for expressive aphasia, HA. Dx small acute L-MCA infarct, highly suspicious for Afib. Pt also w/ palpitations. Outpt monitor planned. DAPT x 3 weeks>>Plavix alone.   Admitted 02/01/2018 with new neuro changes, MRI w/ propagation of previous CVA. Witnessed seizure>>Keppra started.   Cards contacted for TEE, results below. Mod AS w/ neg bubble study. Mild carotid dz by Doppler. +OSA>>being started on CPAP.   Cards asked to see for monitor and AS.  [name] and his wife are in the room.  He is alert and oriented, has trouble with word finding and phrasing, but can answer simple questions.  [name] has been having dyspnea on exertion for a while.  He states whenever he goes downstairs or comes upstairs or carries something, he will get short of breath.  This has been coming on gradually, but he has really noticed it in the last month or so.  He has not been waking in the night due to shortness of breath.  He has not woken with lower extremity edema.  He denies recent orthopnea.  He also has had some chest pain.  He points to his left chest, over his heart.  He had some today.  He cannot say exactly when it happened or give other details about it.  He cannot rate it.  His wife was unaware of it.  He has had some palpitations, describes heart skips.  By his description, it sounds like PVCs or PACs.  He was seen by home health nurse and an EKG was obtained after she heard the skips.  That ECG is  not available because there are no ECGs in the system prior to the ones from his admissions within the last week.  He has no history of syncope or presyncope.  He is weak, is not able to sit up in the bed by himself.   Past Medical History:  Diagnosis Date  . CVA (cerebral vascular accident) ([place]) 01/29/2018  . CVA (cerebral vascular accident) ([place]) 02/01/2018  . Diarrhea   . Difficulty urinating   . Hypertension   . Rectal bleeding   . Rectal pain     Past Surgical History:  Procedure Laterality Date  . BACK SURGERY  1980's  . CHOLECYSTECTOMY  2005  . [place]  2011  . NECK SURGERY  2009  . NOSE SURGERY  2006     Prior to Admission medications   Medication Sig Start Date End Date Taking? Authorizing Provider  aspirin EC 81 MG tablet Take 81 mg by mouth once.   Yes [provider]  atorvastatin (LIPITOR) 20 MG tablet Take 1 tablet (20 mg total) by mouth daily at 6 PM. 01/30/18  Yes Eliseo Squires, Jessica U, DO  clopidogrel (PLAVIX) 75 MG tablet Take 1 tablet (75 mg total) by mouth daily. 01/31/18  Yes Eulogio Bear U, DO  doxazosin (CARDURA) 4 MG tablet Take 4 mg by mouth at bedtime.  11/11/13  Yes [provider]  dutasteride (AVODART)  0.5 MG capsule Take 0.5 mg by mouth daily. 12/29/17  Yes [provider]  finasteride (PROSCAR) 5 MG tablet Take 5 mg by mouth daily.  11/28/13  Yes [provider]  HYDROcodone-acetaminophen (NORCO/VICODIN) 5-325 MG tablet Take 1 tablet by mouth daily as needed (for pain).  12/26/17  Yes [provider]  hydrocortisone (ANUSOL-HC) 25 MG suppository Place 25 mg rectally See admin instructions. UNWRAP AND INSERT 1 SUPPOSITORY RECTALLY TWICE DAILY AS NEEDED FOR HEMORRHOID SYMPTOMS 01/20/18  Yes [provider]  hydrocortisone cream (PREPARATION H) 1 % Apply 1 application topically 2 (two) times daily as needed for itching.   Yes [provider]  losartan (COZAAR) 25 MG tablet Take 25 mg by  mouth daily. 01/15/18  Yes [provider]    Inpatient Medications: Scheduled Meds: . aspirin EC  81 mg Oral Daily  . atorvastatin  80 mg Oral q1800  . clopidogrel  75 mg Oral Daily  . dutasteride  0.5 mg Oral Daily  . enoxaparin (LOVENOX) injection  40 mg Subcutaneous Q24H  . levETIRAcetam  500 mg Oral BID   Continuous Infusions:  PRN Meds: acetaminophen **OR** acetaminophen (TYLENOL) oral liquid 160 mg/5 mL **OR** acetaminophen, HYDROcodone-acetaminophen, senna-docusate, sodium chloride  Allergies:   No Known Allergies  Social History:   Social History   Socioeconomic History  . Marital status: Married    Spouse name: Not on file  . Number of children: Not on file  . Years of education: Not on file  . Highest education level: Not on file  Occupational History  . Occupation: retired  Scientific laboratory technician  . Financial resource strain: Not on file  . Food insecurity:    Worry: Not on file    Inability: Not on file  . Transportation needs:    Medical: Not on file    Non-medical: Not on file  Tobacco Use  . Smoking status: Never Smoker  . Smokeless tobacco: Never Used  Substance and Sexual Activity  . Alcohol use: Not Currently    Alcohol/week: 0.0 standard drinks    Comment: occ.  . Drug use: No  . Sexual activity: Not on file  Lifestyle  . Physical activity:    Days per week: Not on file    Minutes per session: Not on file  . Stress: Not on file  Relationships  . Social connections:    Talks on phone: Not on file    Gets together: Not on file    Attends religious service: Not on file    Active member of club or organization: Not on file    Attends meetings of clubs or organizations: Not on file    Relationship status: Not on file  . Intimate partner violence:    Fear of current or ex partner: Not on file    Emotionally abused: Not on file    Physically abused: Not on file    Forced sexual activity: Not on file  Other Topics Concern  . Not on file    Social History Narrative   Lives with his wife in [place], [place]    Family History:   Family History  Problem Relation Age of Onset  . Cancer Mother        breast/colon  . Heart disease Father   . Stroke Neg Hx    Family Status:  Family Status  Relation Name Status  . Mother  Deceased  . Father  Deceased  . Neg Hx  (Not Specified)  ROS:  Please see the history of present illness.  All other ROS reviewed and negative.     Physical Exam/Data:   Vitals:   02/03/18 2334 02/04/18 0356 02/04/18 0736 02/04/18 1248  BP: 134/84 (!) 171/65 (!) 140/92 137/81  Pulse: 65 (!) 58 61 66  Resp: 18 18 15 18   Temp: (!) 97.4 F (36.3 C) 98 F (36.7 C) 98 F (36.7 C) 98.1 F (36.7 C)  TempSrc: Axillary Axillary Oral Oral  SpO2: 100% 96% 96% 97%    Intake/Output Summary (Last 24 hours) at 02/04/2018 1416 Last data filed at 02/03/2018 2030 Gross per 24 hour  Intake 120 ml  Output -  Net 120 ml   There were no vitals filed for this visit. There is no height or weight on file to calculate BMI.  General:  Well nourished, well developed, obese male, in no acute distress HEENT: normal Lymph: no adenopathy Neck: no JVD seen, difficult to assess secondary to body habitus Endocrine:  No thryomegaly Vascular: Bilateral carotid bruits; 4/4 extremity pulses 2+, without bruits  Cardiac:  normal S1, S2; RRR; 2/6 murmur  Lungs:  clear to auscultation bilaterally, no wheezing, rhonchi or rales  Abd: soft, nontender, no hepatomegaly  Ext: no edema Musculoskeletal:  No deformities, BUE and BLE strength weak but equal Skin: warm and dry  Neuro:  CNs 2-12 intact, no focal abnormalities noted Psych:  Normal affect   EKG:  The EKG was personally reviewed and demonstrates: Sinus rhythm, heart rate 78, no significant ischemic changes, no pathologic Q waves Telemetry:  Telemetry was personally reviewed and demonstrates: Sinus rhythm, no ectopy noted  Relevant CV Studies:  ECHO: 11/21/2017 - Left  ventricle: The cavity size was normal. There was severe   concentric hypertrophy. Systolic function was vigorous. The   estimated ejection fraction was in the range of 65% to 70%. Wall   motion was normal; there were no regional wall motion   abnormalities. There was an increased relative contribution of   atrial contraction to ventricular filling. Doppler parameters are   consistent with abnormal left ventricular relaxation (grade 1   diastolic dysfunction). - Aortic valve: Valve mobility was restricted. There was moderate   stenosis. Mean gradient (S): 23 mm Hg. Valve area (VTI): 1.24   cm^2. Valve area (Vmax): 1.01 cm^2. Valve area (Vmean): 1.08   cm^2.  Mean gradient (S): 23 mm Hg. Peak gradient (S): 47 mm Hg. - Aorta: Aortic root dimension: 37 mm (ED). Ascending aorta   diameter: 39 mm (ED). - Aortic root: The aortic root was mildly dilated. - Left atrium: The atrium was moderately dilated. - Pulmonic valve: There was trivial regurgitation. - Pulmonary arteries: Systolic pressure could not be accurately   estimated.   Procedure: Transesophageal Echocardiogram 02/03/2018 Indications: CVA Normal LV function; calcified aortic valve with reduced cusp excursion; moderate AS (mean gradient 36 mmHg and peak velocity 3.7 m/s by transthoracic; AVA 1.1 cm2 by planimetry) and mild AI; negative saline microcavitation study.   Complications: No apparent complications Patient did tolerate procedure well.  Kirk Ruths, MD   Laboratory Data:  Chemistry Recent Labs  Lab 01/29/18 1220  02/01/18 0943 02/01/18 0947 02/02/18 0652  NA 140   < > 138 138 137  K 4.6   < > 4.1 4.0 3.9  CL 99   < > 103 102 105  CO2 29  --  26  --  25  GLUCOSE 160*   < > 141* 139* 106*  BUN  9   < > 11 13 11   CREATININE 1.11   < > 1.12 1.00 0.89  CALCIUM 9.6  --  8.8*  --  8.5*  GFRNONAA >60  --  >60  --  >60  GFRAA >60  --  >60  --  >60  ANIONGAP 12  --  9  --  7   < > = values in this interval not  displayed.    Lab Results  Component Value Date   ALT 21 02/01/2018   AST 23 02/01/2018   ALKPHOS 51 02/01/2018   BILITOT 0.8 02/01/2018   Hematology Recent Labs  Lab 01/29/18 1220  02/01/18 0943 02/01/18 0947 02/02/18 0652  WBC 9.4  --  8.9  --  9.6  RBC 5.50  --  5.18  --  4.87  HGB 17.0   < > 16.1 16.7 15.3  HCT 53.5*   < > 50.5 49.0 47.1  MCV 97.3  --  97.5  --  96.7  MCH 30.9  --  31.1  --  31.4  MCHC 31.8  --  31.9  --  32.5  RDW 12.6  --  12.5  --  12.6  PLT 201  --  176  --  155   < > = values in this interval not displayed.   Cardiac Enzymes  Recent Labs  Lab 01/29/18 1228 02/01/18 0945  TROPIPOC 0.00 0.00    TSH:  Lab Results  Component Value Date   TSH 0.503 01/29/2018   Lipids: Lab Results  Component Value Date   CHOL 207 (H) 01/30/2018   HDL 30 (L) 01/30/2018   LDLCALC 113 (H) 01/30/2018   TRIG 321 (H) 01/30/2018   CHOLHDL 6.9 01/30/2018   HgbA1c: Lab Results  Component Value Date   HGBA1C 6.5 (H) 01/30/2018   Magnesium: No results found for: MG   Radiology/Studies:  Mr Brain Wo Contrast  Result Date: 02/01/2018 CLINICAL DATA:  Worsening expressive aphasia, RIGHT facial droop. History of hypertension and stroke. EXAM: MRI HEAD WITHOUT CONTRAST TECHNIQUE: Multiplanar, multiecho pulse sequences of the brain and surrounding structures were obtained without intravenous contrast. COMPARISON:  CT HEAD February 01, 2018 and MRI head January 29, 2018. FINDINGS: INTRACRANIAL CONTENTS: Patchy reduced diffusion LEFT frontal lobe extending to operculum increased in size from prior examination with low ADC values. No susceptibility artifact to suggest hemorrhage. Old RIGHT basal ganglia infarct. Patchy supratentorial and pontine white matter FLAIR T2 hyperintensities exclusive aforementioned abnormality. Old pontine lacunar infarct. Moderate parenchymal brain volume loss. Stable appearance of low T2, FLAIR T2 hyperintense 12 mm RIGHT lateral ventricle  mass most compatible with subependymoma. VASCULAR: Normal major intracranial vascular flow voids present at skull base. SKULL AND UPPER CERVICAL SPINE: No abnormal sellar expansion. No suspicious calvarial bone marrow signal. Craniocervical junction maintained. SINUSES/ORBITS: LEFT maxillary mucosal retention cyst. Mild paranasal sinus mucosal thickening. Mastoid air cells are well aerated. Included ocular globes and orbital contents are non-suspicious. OTHER: None. IMPRESSION: 1. Acute LEFT frontal lobe/MCA territory nonhemorrhagic infarct, propagation from prior MRI. 2. Old RIGHT basal ganglia infarct. Mild-to-moderate chronic small vessel ischemic changes. 3. Moderate parenchymal brain volume loss. 4. 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 Electronically Signed   By: Elon Alas M.D.   On: 02/01/2018 23:54   Ct Head Code Stroke Wo Contrast  Result Date: 02/01/2018 CLINICAL DATA:  Code stroke. Awoke with increased RIGHT-sided facial droop, and expressive aphasia. Atrial fibrillation. EXAM: CT HEAD WITHOUT CONTRAST  TECHNIQUE: Contiguous axial images were obtained from the base of the skull through the vertex without intravenous contrast. COMPARISON:  CT head 01/29/2018. MR head 01/29/2018. CTA head neck 01/29/2018. FINDINGS: Brain: New areas of hypoattenuation involving the LEFT frontal operculum, LEFT frontal cortex, and subcortical white matter compared with CT and MR 3 days ago consistent with extension of the previous LEFT MCA territory infarct. No hemorrhage, intra-axial mass lesion, hydrocephalus, or extra-axial fluid. Unchanged RIGHT lateral ventricle subependymoma. Chronic RIGHT basal ganglia infarct unchanged. Vascular: Calcification of the cavernous internal carotid arteries consistent with cerebrovascular atherosclerotic disease. No signs of intracranial large vessel occlusion. Skull: Normal. Negative for fracture or focal lesion. Sinuses/Orbits: No acute  finding. Other: None. ASPECTS [facility] Stroke Program Early CT Score) - Ganglionic level infarction (caudate, lentiform nuclei, internal capsule, insula, M1-M3 cortex): 6 - Supraganglionic infarction (M4-M6 cortex): 2 Total score (0-10 with 10 being normal): 8 IMPRESSION: 1. New areas of hypoattenuation involving LEFT frontal operculum, LEFT frontal cortex, and subcortical white matter, consistent with extension of the previously documented acute LEFT MCA territory infarct. No reperfusion hemorrhage. Unchanged RIGHT lateral ventricle subependymoma. No signs of large vessel occlusion. 2. ASPECTS is 8. These results were called by telephone at the time of interpretation on 02/01/2018 at 10:04 am to Dr. Cheral Marker, Who verbally acknowledged these results. Electronically Signed   By: Staci Righter M.D.   On: 02/01/2018 10:05    Assessment and Plan:   1. Moderate AS - seen on echo 11/2017 and on TEE - MD to review the studies and advise, per dictations, mean gradient went from 23>>36 in 2 months. - The left ear likely accounts for his shortness of breath with exertion  2.  Chest pain: He is not able to describe the symptoms very well. - EF is normal with no wall motion abnormalities - Cardiac enzymes were checked both admissions and were negative - Dr. Meda Coffee to review and advise if any further work-up is needed  3.  CVA, possible event monitor - He needs to be evaluated for arrhythmias. - Dr. Meda Coffee to review and advise on event monitor versus loop recorder  Otherwise, per IM/Neuro Principal Problem:   CVA (cerebral vascular accident) [facility]) Active Problems:   Essential hypertension   BPH (benign prostatic hyperplasia)   Observed seizure-like activity ([place])   Aphasia  For questions or updates, please contact [facility] Please consult [email or web address] for contact info under Cardiology/STEMI.   Signed, Rosaria Ferries, PA-C  02/04/2018 2:16 PM  The patient was seen, examined and discussed with  Rosaria Ferries, PA-C and I agree with the above.   72 y.o. male with a hx of HTN, rectal bleeding, obesity, being seen today for the evaluation of mod AS and need for event monitor, the patient was admitted 09/26-09/27/2019 for expressive aphasia, HA. Dx small acute L-MCA infarct, highly suspicious for Afib. Pt also w/ palpitations. Outpt monitor planned. DAPT x 3 weeks>>Plavix alone.  Admitted again on 02/01/2018 with new neuro changes, MRI w/ propagation of previous CVA. Witnessed seizure>>Keppra started.  The patient still has profound dysphasia.  He is also complaining of exertional chest pain and dyspnea on exertion. Echocardiogram showed LVEF 65 to 70% and moderate aortic stenosis with mean gradient of 23 mmHg, aortic valve area of 1.2 cm.. TEE showed mean gradient of 36 mmHg aortic valve area of 1.1 cm and negative bubble study.  Assessment and plan: I have reviewed both transthoracic and transesophageal echocardiogram images, his native aortic valve is severely thickened and  calcified however leaflets still open somewhat, together with mean gradient of 36 mmHg I would coldest moderate to severe aortic stenosis.  Ultimately this patient will require right and left sided cardiac catheterization for evaluation of severe aortic stenosis and possible coronary artery disease given the symptoms and risk factors.  However I would not do that in the settings of acute ischemic stroke with ongoing dysphagia and significant weakness when patient is not even able to sit up by himself. I would give patient time to recover from his stroke, undergo intensive rehabilitation, we will arrange for follow-up in the clinic, and if he recovers neurologically, we will arrange for right and left cardiac catheterization.  I would also arrange for repeat echocardiogram 3 months for now to see if there is progression of aortic stenosis. I believe that patient should receive a loop recorder for evaluation of possible  atrial fibrillation given to recent ischemic strokes.  Ena Dawley, MD 02/04/2018

## 2018-02-04 NOTE — Progress Notes (Signed)
STROKE TEAM PROGRESS NOTE      SUBJECTIVE (INTERVAL HISTORY) His  wife  Is at the bedside.   He states hisf right-sided weakness and speech difficulties   appears to be improving. He  is participating in the sleep smart study and tolerated the CPAP device overnight   for sleep apnea and twas randomized to  CPAP  Arm of the study...    OBJECTIVE Vitals:   02/03/18 1928 02/03/18 2334 02/04/18 0356 02/04/18 0736  BP: (!) 147/79 134/84 (!) 171/65 (!) 140/92  Pulse: 65 65 (!) 58 61  Resp: 18 18 18 15   Temp: 98.4 F (36.9 C) (!) 97.4 F (36.3 C) 98 F (36.7 C) 98 F (36.7 C)  TempSrc: Oral Axillary Axillary Oral  SpO2: 95% 100% 96% 96%    CBC:  Recent Labs  Lab 01/29/18 1220  02/01/18 0943 02/01/18 0947 02/02/18 0652  WBC 9.4  --  8.9  --  9.6  NEUTROABS 6.1  --  5.4  --   --   HGB 17.0   < > 16.1 16.7 15.3  HCT 53.5*   < > 50.5 49.0 47.1  MCV 97.3  --  97.5  --  96.7  PLT 201  --  176  --  155   < > = values in this interval not displayed.    Basic Metabolic Panel:  Recent Labs  Lab 02/01/18 0943 02/01/18 0947 02/02/18 0652  NA 138 138 137  K 4.1 4.0 3.9  CL 103 102 105  CO2 26  --  25  GLUCOSE 141* 139* 106*  BUN 11 13 11   CREATININE 1.12 1.00 0.89  CALCIUM 8.8*  --  8.5*    Lipid Panel:     Component Value Date/Time   CHOL 207 (H) 01/30/2018 0347   TRIG 321 (H) 01/30/2018 0347   HDL 30 (L) 01/30/2018 0347   CHOLHDL 6.9 01/30/2018 0347   VLDL 64 (H) 01/30/2018 0347   LDLCALC 113 (H) 01/30/2018 0347   HgbA1c:  Lab Results  Component Value Date   HGBA1C 6.5 (H) 01/30/2018   Urine Drug Screen:     Component Value Date/Time   LABOPIA NONE DETECTED 01/29/2018 1228   COCAINSCRNUR NONE DETECTED 01/29/2018 1228   LABBENZ NONE DETECTED 01/29/2018 1228   AMPHETMU NONE DETECTED 01/29/2018 1228   THCU NONE DETECTED 01/29/2018 1228   LABBARB NONE DETECTED 01/29/2018 1228    Alcohol Level     Component Value Date/Time   ETH <10 02/01/2018 0943     IMAGING   Mr Brain Wo Contrast 02/01/2018 IMPRESSION:  1. Acute LEFT frontal lobe/MCA territory nonhemorrhagic infarct, propagation from prior MRI.  2. Old RIGHT basal ganglia infarct. Mild-to-moderate chronic small vessel ischemic changes.  3. Moderate parenchymal brain volume loss.  4. 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     Ct Head Code Stroke Wo Contrast 02/01/2018 IMPRESSION:  1. New areas of hypoattenuation involving LEFT frontal operculum, LEFT frontal cortex, and subcortical white matter, consistent with extension of the previously documented acute LEFT MCA territory infarct. No reperfusion hemorrhage. Unchanged RIGHT lateral ventricle subependymoma. No signs of large vessel occlusion.  2. ASPECTS is 8.      Transthoracic Echocardiogram  11/21/2017 Study Conclusions - Left ventricle: The cavity size was normal. There was severe   concentric hypertrophy. Systolic function was vigorous. The   estimated ejection fraction was in the range of 65% to 70%. Wall   motion was  normal; there were no regional wall motion   abnormalities. There was an increased relative contribution of   atrial contraction to ventricular filling. Doppler parameters are   consistent with abnormal left ventricular relaxation (grade 1   diastolic dysfunction). - Aortic valve: Valve mobility was restricted. There was moderate   stenosis. Mean gradient (S): 23 mm Hg. Valve area (VTI): 1.24   cm^2. Valve area (Vmax): 1.01 cm^2. Valve area (Vmean): 1.08   cm^2. - Aorta: Aortic root dimension: 37 mm (ED). Ascending aorta   diameter: 39 mm (ED). - Aortic root: The aortic root was mildly dilated. - Left atrium: The atrium was moderately dilated. - Pulmonic valve: There was trivial regurgitation. - Pulmonary arteries: Systolic pressure could not be accurately   estimated.    Bilateral Carotid Dopplers -  Bilateral 1-39 percent carotid  stenosis  EEG 02/01/2018 Impression The EEG isnormalin awakestate only   TEE  02/03/2018   Normal LV function; calcified aortic valve with reduced cusp excursion; moderate AS (mean gradient 36 mmHg and peak velocity 3.7 m/s by transthoracic; AVA 1.1 cm2 by planimetry) and mild AI; negative saline microcavitation study.     PHYSICAL EXAM Blood pressure (!) 140/92, pulse 61, temperature 98 F (36.7 C), temperature source Oral, resp. rate 15, SpO2 96 %.  Obese middle aged male not in distress. . Afebrile. Head is nontraumatic. Neck is supple without bruit.    Cardiac exam no murmur or gallop. Lungs are clear to auscultation. Distal pulses are well felt.  Neurological Exam :  Awake alert nonfluent speech with mild expressive aphasia. Good comprehension, naming and repetition. Follows commands well. Extraocular moments are full range without nystagmus. Blinks to threat bilaterally. No facial weakness. Tongue midline. Motor system exam symmetric upper and lower extremity strength without drift or focal weakness. Fine finger movements are diminished on the right. Orbits left over right upper extremity. Deep tendon reflexes symmetric. Plantars are downgoing. Gait not tested.  NIH stroke scale 1 Modified Rankin at baseline 1  ASSESSMENT/PLAN [name] is a 72 y.o. male with history of a pre diabetes, prior CVA, and hypertension presenting with facial asymetry, aphasia, and possible new onset seizure activity. He did not receive IV t-PA due to mild deficits.  Stroke:  Acute LEFT frontal lobe/MCA territory infarct - embolic - unknown source.  Resultant  Mild aphasia  CT head - New areas of hypoattenuation involving LEFT frontal operculum, LEFT frontal cortex, and subcortical white matter, consistent with extension of the previously documented acute LEFT MCA territory infarct.  MRI head - Acute LEFT frontal lobe/MCA territory nonhemorrhagic infarct, propagation from prior MRI. Old RIGHT  basal ganglia infarct.   MRA head - not performed  Carotid Doppler - Bilateral 1-39 percent carotid stenosis  2D Echo - 11/21/2017 - EF 65 - 70%. No cardiac source of emboli identified.   EEG - The EEG isnormalin awakestate only  TEE 02/03/2018 - Normal LV function; no cardiac source of emboli identified.  LDL - 113  HgbA1c - 6.5  VTE prophylaxis - Lovenox  Diet  - Heart healthy with thin liquids.  aspirin 81 mg daily and clopidogrel 75 mg daily prior to admission, now on aspirin 81 mg daily and clopidogrel 75 mg daily  Patient counseled to be compliant with his antithrombotic medications  Ongoing aggressive stroke risk factor management  Therapy recommendations: Outpt PT recommended Disposition: home Hypertension  Stable  Permissive hypertension (OK if < 220/120) but gradually normalize in 5-7 days . Long-term  BP goal normotensive  Hyperlipidemia  Lipid lowering medication PTA: Lipitor 20 mg daily  LDL 113, goal < 70  Current lipid lowering medication: Lipitor 20 mg daily -> increase Lipitor to 80 mg daily.  Continue statin at discharge  Diabetes  HgbA1c 6.5, goal < 7.0  Controlled  No history of diabetes - probably has pre diabetes - may need diabetes nurse educator to see pt.  Other Stroke Risk Factors  Advanced age  ETOH use, advised to drink no more than 1 alcoholic beverage per day.  Mild Obesity, There is no height or weight on file to calculate BMI., recommend weight loss, diet and exercise as appropriate   Hx stroke/TIA   Other Active Problems  Possible new onset seizure activity -> Keppra  Probable pre diabetes   Plan He was randomized to the CPAP arm   of sleep smart stroke study   Cardiology consult for aortic stenosis and to arrange 30 day outpatient telemetry monitoring for paroxysmal A. Fib. Long discussion with the patient, wife at the bedside and answered questions. Discussed with Dr. Anselm Pancoast day # 3  I have  personally examined this patient, reviewed notes, independently viewed imaging studies, participated in medical decision making and plan of care.ROS completed by me personally and pertinent positives fully documented  I have made any additions or clarifications directly to the above note. Agree with note above.  He presented last week with left MCA branch embolic infarct and was discharged home on dual antiplatelet therapy . Therapy and presents with right-sided facial weakness and drying unclear whether partial seizure or due to extension of his stroke. MRI definitely shows increase in size of his stroke. Recommend continue dual antiplatelet therapy  Cardiology  feels patient's tracing showed paroxysmal atrial tachycardia which is associated with high risk for atrial fibrillation hence 30 day heart monitor would be preferred over a loop recorder. Patient is participating in the sleep smart study and  was randomized to   get CPAP device  t.Discussed with patient,  and wife and answered questions. Discussed with Dr. Marthenia Rolling Greater than 50% time during this 25 minute visit was spent on counseling and coordination of care about his embolic stroke and answering questions Follow-up as an outpatient for the sleep smart stroke study as scheduled. Stroke team will sign off. Kindly call for questions. Antony Contras, MD Medical Director [facility] Pager: [phone] 02/04/2018 1:42 PM  To contact Stroke Continuity provider, please refer to [email or web address] After hours, contact General Neurology

## 2018-02-04 NOTE — Progress Notes (Signed)
Occupational Therapy Treatment Patient Details Name: [name] MRN: [id number] DOB: 13-Feb-1946 Today's Date: 02/04/2018    History of present illness 72 y.o. male PMH HTN, recent CVA 01/29/2018 no residual deficits presented to [facility] ED with right facial droop and expressive aphasia. Code stroke was called by EDP and canceled by neurologist. Parkinsonian findings were also noted on exam. Per MD notes probable TIA or a possible extension of old stroke. Had 30 second period of R facial tingling with facial jerking earlier today and was postictal afterwards with expressive aphasia. EEG today was normal in awake state.   OT comments  Patient progressing well.  Demonstrates ability to complete toileting and toilet transfers with supervision, mobility in room with no AD.  Simulated tub transfers with min guard assist, and patient agreeable to having spouse around when bathing in shower.  Patient continues to present with word finding difficulties, becoming tearful at end of session due to frustrations.  Provided HEP for L hand [place] and strengthening, using theraputty; pt return demonstrates exercises with independence.  Patient plans to dc home with spouses/neighbors support.  Will continue to follow while admitted.    Follow Up Recommendations  No OT follow up;Supervision/Assistance - 24 hour    Equipment Recommendations  None recommended by OT    Recommendations for Other Services      Precautions / Restrictions Precautions Precautions: None Restrictions Weight Bearing Restrictions: No       Mobility Bed Mobility Overal bed mobility: Independent                Transfers Overall transfer level: Needs assistance Equipment used: None Transfers: Sit to/from Stand Sit to Stand: Supervision         General transfer comment: supervision for safety    Balance Overall balance assessment: Needs assistance Sitting-balance support: Feet supported;No upper extremity supported Sitting  balance-Leahy Scale: Good     Standing balance support: No upper extremity supported;During functional activity Standing balance-Leahy Scale: Fair                             ADL either performed or assessed with clinical judgement   ADL Overall ADL's : Needs assistance/impaired     Grooming: Wash/dry hands;Applying deodorant;Supervision/safety;Standing                   Toilet Transfer: Supervision/safety;Ambulation;Regular Glass blower/designer Details (indicate cue type and reason): supervision for toilet transfers  [place] and Hygiene: Supervision/safety;Sit to/from stand   Tub/ Shower Transfer: Min guard;Tub transfer;Ambulation(simulated) Tub/Shower Transfer Details (indicate cue type and reason): reviewed safety with supervision for bathing in shower, using wall to support while stepping over threshold  Functional mobility during ADLs: Supervision/safety General ADL Comments: pt declined use of RW, patinet requires close supervision for safety     Vision       Perception     Praxis      Cognition Arousal/Alertness: Awake/alert Behavior During Therapy: Flat affect Overall Cognitive Status: Impaired/Different from baseline Area of Impairment: Problem solving;Awareness                           Awareness: Emergent Problem Solving: Slow processing;Requires verbal cues General Comments: pt requires increased time and is limited by expressive aphasia.         Exercises Exercises: Other exercises Other Exercises Other Exercises: provided theraputty and reviewed HEP (yellow) for L hand coordination  Shoulder Instructions       General Comments spouse present and supportive    Pertinent Vitals/ Pain       Pain Assessment: No/denies pain  Home Living                                          Prior Functioning/Environment              Frequency  Min 2X/week        Progress  Toward Goals  OT Goals(current goals can now be found in the care plan section)  Progress towards OT goals: Progressing toward goals  Acute Rehab OT Goals Patient Stated Goal: to go home  OT Goal Formulation: With patient Time For Goal Achievement: 02/16/18 Potential to Achieve Goals: Good  Plan Discharge plan remains appropriate;Frequency remains appropriate    Co-evaluation                 AM-PAC PT "6 Clicks" Daily Activity     Outcome Measure   Help from another person eating meals?: None Help from another person taking care of personal grooming?: None Help from another person toileting, which includes using toliet, bedpan, or urinal?: None Help from another person bathing (including washing, rinsing, drying)?: None Help from another person to put on and taking off regular upper body clothing?: None Help from another person to put on and taking off regular lower body clothing?: A Little 6 Click Score: 23    End of Session    OT Visit Diagnosis: Muscle weakness (generalized) (M62.81)   Activity Tolerance Patient tolerated treatment well   Patient Left in bed;with call bell/phone within reach;with family/visitor present   Nurse Communication Mobility status        Time: 8841-6606 OT Time Calculation (min): 32 min  Charges: OT General Charges $OT Visit: 1 Visit OT Treatments $Self Care/Home Management : 8-22 mins $Neuromuscular Re-education: 8-22 mins  Delight Stare, OT Acute Rehabilitation Services Pager [phone] Office [phone]    Delight Stare 02/04/2018, 3:22 PM

## 2018-02-04 NOTE — Care Management Note (Signed)
Case Management Note  Patient Details  Name: [name] MRN: [id number] Date of Birth: 1946-03-18  Subjective/Objective:                    Action/Plan: Pt discharging home with outpatient therapy. CM met with the patient and his spouse and they prefer the recommended outpatient therapy over [facility] services. Pts wife would like outpatient therapy at [facility]. Orders faxed to [facility] outpatient therapy: [phone]. [place] stated the would call the patient to schedule the first appointment.  Wife to provide 24 hour supervision at home and transportation to home.   Expected Discharge Date:  02/04/18               Expected Discharge Plan:  OP Rehab  In-House Referral:     Discharge planning Services  CM Consult  Post Acute Care Choice:    Choice offered to:     DME Arranged:    DME Agency:     HH Arranged:    HH Agency:     Status of Service:  Completed, signed off  If discussed at [employer] of Stay Meetings, dates discussed:    Additional Comments:  Pollie Friar, RN 02/04/2018, 5:05 PM

## 2018-02-04 NOTE — Care Management Important Message (Signed)
Important Message  Patient Details  Name: [name] MRN: [id number] Date of Birth: June 30, 1945   Medicare Important Message Given:  Yes    Orbie Pyo 02/04/2018, 2:07 PM

## 2018-02-04 NOTE — Progress Notes (Signed)
Pt discharge education and instructions completed with pt and spouse at bedside; both voices understanding and denies any questions. Pt IV and telemetry removed; pt discharge home with spouse to transport him to disposition. Pt to pick up electronically sent prescriptions from preferred pharmacy on file. Pt transported off unit via wheelchair with spouse and belongings to the side. Delia Heady RN

## 2018-02-05 ENCOUNTER — Encounter (HOSPITAL_COMMUNITY): Payer: Self-pay | Admitting: Cardiology

## 2018-02-06 NOTE — Telephone Encounter (Signed)
Patient contacted regarding discharge from [facility] on 02/04/18.  Patient understands to follow up with provider Ermalinda Barrios, PA on 02/24/18 at 12:00 PM at [address] [place], [place] [address].  Patient understands discharge instructions?Yes  Patient understands medications and regiment? Yes  Patient understands to bring all medications to this visit? Yes  Patient has appointment for event monitor to be placed on 02/10/18 at 2:30 PM.

## 2018-02-10 ENCOUNTER — Ambulatory Visit (INDEPENDENT_AMBULATORY_CARE_PROVIDER_SITE_OTHER): Payer: Medicare Other

## 2018-02-10 ENCOUNTER — Other Ambulatory Visit: Payer: Self-pay | Admitting: Cardiology

## 2018-02-10 DIAGNOSIS — I639 Cerebral infarction, unspecified: Secondary | ICD-10-CM

## 2018-02-10 DIAGNOSIS — I480 Paroxysmal atrial fibrillation: Secondary | ICD-10-CM

## 2018-02-10 DIAGNOSIS — R002 Palpitations: Secondary | ICD-10-CM

## 2018-02-24 ENCOUNTER — Encounter: Payer: Self-pay | Admitting: Physician Assistant

## 2018-02-24 ENCOUNTER — Ambulatory Visit: Payer: Medicare Other | Admitting: Physician Assistant

## 2018-02-24 VITALS — BP 124/82 | HR 75 | Ht 70.0 in | Wt 263.8 lb

## 2018-02-24 DIAGNOSIS — R079 Chest pain, unspecified: Secondary | ICD-10-CM

## 2018-02-24 DIAGNOSIS — I6300 Cerebral infarction due to thrombosis of unspecified precerebral artery: Secondary | ICD-10-CM

## 2018-02-24 DIAGNOSIS — R002 Palpitations: Secondary | ICD-10-CM | POA: Insufficient documentation

## 2018-02-24 DIAGNOSIS — E782 Mixed hyperlipidemia: Secondary | ICD-10-CM

## 2018-02-24 DIAGNOSIS — Z13220 Encounter for screening for lipoid disorders: Secondary | ICD-10-CM

## 2018-02-24 DIAGNOSIS — E785 Hyperlipidemia, unspecified: Secondary | ICD-10-CM | POA: Insufficient documentation

## 2018-02-24 DIAGNOSIS — I350 Nonrheumatic aortic (valve) stenosis: Secondary | ICD-10-CM

## 2018-02-24 MED ORDER — LOSARTAN POTASSIUM 25 MG PO TABS: 25.0000 mg | ORAL_TABLET | Freq: Every day | ORAL | 3 refills | Status: DC

## 2018-02-24 MED ORDER — ATORVASTATIN CALCIUM 80 MG PO TABS: 80.0000 mg | ORAL_TABLET | Freq: Every day | ORAL | 3 refills | Status: DC

## 2018-02-24 MED ORDER — CLOPIDOGREL BISULFATE 75 MG PO TABS: 75.0000 mg | ORAL_TABLET | Freq: Every day | ORAL | 3 refills | Status: DC

## 2018-02-24 MED ORDER — DOXAZOSIN MESYLATE 4 MG PO TABS: 4.0000 mg | ORAL_TABLET | Freq: Every day | ORAL | 3 refills | Status: DC

## 2018-02-24 NOTE — Addendum Note (Signed)
Addended by: Imogene Burn on: 02/24/2018 01:00 PM   Modules accepted: Level of Service

## 2018-02-24 NOTE — Patient Instructions (Signed)
Medication Instructions:  Your physician recommends that you continue on your current medications as directed. Please refer to the Current Medication list given to you today.  If you need a refill on your cardiac medications before your next appointment, please call your pharmacy.   Lab work: Your physician recommends that you return for a FASTING lipid profile and liver function panel in 2 MONTHS  If you have labs (blood work) drawn today and your tests are completely normal, you will receive your results only by: Marland Kitchen MyChart Message (if you have MyChart) OR . A paper copy in the mail If you have any lab test that is abnormal or we need to change your treatment, we will call you to review the results.  Testing/Procedures: Your physician has requested that you have an echocardiogram in 2 MONTHS. Echocardiography is a painless test that uses sound waves to create images of your heart. It provides your doctor with information about the size and shape of your heart and how well your heart's chambers and valves are working. This procedure takes approximately one hour. There are no restrictions for this procedure.    Follow-Up: At [facility], you and your health needs are our priority.  As part of our continuing mission to provide you with exceptional heart care, we have created designated Provider Care Teams.  These Care Teams include your primary Cardiologist (physician) and Advanced Practice Providers (APPs -  Physician Assistants and Nurse Practitioners) who all work together to provide you with the care you need, when you need it. You will need a follow up appointment in January 2020.  Please call our office 2 months in advance to schedule this appointment.  You may see Ena Dawley, MD or one of the following Advanced Practice Providers on your designated Care Team:   [place], PA-C Melina Copa, PA-C . Ermalinda Barrios, PA-C  Any Other Special Instructions Will Be Listed Below (If  Applicable).

## 2018-02-24 NOTE — Progress Notes (Addendum)
Cardiology Office Note    Date:  02/24/2018   ID:  [name], DOB 1945-12-06, MRN [id number]  PCP:  Isaias Sakai, DO  Cardiologist: Ena Dawley, MD EPS: None  Chief Complaint  Patient presents with  . Hospitalization Follow-up    History of Present Illness:  [name] is a 72 y.o. male with history of hypertension, obesity, rectal bleeding who was admitted to the hospital 01/30/2018 with small acute left MCA infarct highly suspicious for atrial fibrillation.  Readmitted 02/01/2018 with new neuro changes, MRI with propagation of previous CVA, witnessed seizure, Keppra started.  Patient had palpitations and outpatient monitor ordered.  Plan for DAPT for 3 weeks then Plavix alone.  Patient seen by Dr. Meda Coffee in the hospital.  TEE showed moderate aortic stenosis with negative bubble study, aortic valve area 1.1 cm, mild AI, normal LV function.  Mild carotid disease by Dopplers.  OSA starting on CPAP.  He was also having chest pain.  Ultimately he will require right and left cardiac catheterization for evaluation of severe aortic stenosis and possible coronary disease.  Because of ischemic stroke with ongoing dysphasia and significant weakness he needs time to recover from his stroke before undergoing right and left heart cath.  Recommend repeat echo in 3 months and loop recorder recommended.   Patient comes in today accompanied by his wife.  He is doing physical therapy 3 days a week and thinks he is recovered most of his function.  His speech deficit is the only thing he is having trouble with. Denies chest tightness, palptiations, dyspnea or presyncope. Wearing 30 day monitor.    Past Medical History:  Diagnosis Date  . Arthritis    "hands" (02/04/2018)  . Basal cell carcinoma    "MOHS surgery on sides of my nose" (02/04/2018)  . Chronic lower back pain   . CVA (cerebral vascular accident) ([place]) 01/29/2018  . CVA (cerebral vascular accident) ([place]) 02/01/2018  .  Diarrhea   . Difficulty urinating   . Heart murmur   . High cholesterol   . Hypertension   . OSA on CPAP    "just got CPAP today" (02/04/2018)  . Rectal bleeding   . Rectal pain   . Stroke [facility])    "we were told that MRI showed previous stroke we were not aware that he had" (02/04/2018)    Past Surgical History:  Procedure Laterality Date  . ANTERIOR CERVICAL DECOMP/DISCECTOMY FUSION  2009  . BACK SURGERY  1980's  . EXCISIONAL HEMORRHOIDECTOMY  1989 -2011 X 2 or 3  . LAPAROSCOPIC CHOLECYSTECTOMY  2005  . LUMBAR DISC SURGERY  1980s X 2  . MOHS SURGERY Bilateral 2006   "sides of my nose"  . TEE WITHOUT CARDIOVERSION N/A 02/03/2018   Procedure: TRANSESOPHAGEAL ECHOCARDIOGRAM (TEE);  Surgeon: Lelon Perla, MD;  Location[facility] ENDOSCOPY;  Service: Cardiovascular;  Laterality: N/A;    Current Medications: Current Meds  Medication Sig  . aspirin EC 81 MG EC tablet Take 1 tablet (81 mg total) by mouth daily.  [place] Kitchen atorvastatin (LIPITOR) 80 MG tablet Take 1 tablet (80 mg total) by mouth daily at 6 PM.  . clopidogrel (PLAVIX) 75 MG tablet Take 1 tablet (75 mg total) by mouth daily.  Marland Kitchen doxazosin (CARDURA) 4 MG tablet Take 1 tablet (4 mg total) by mouth at bedtime.  . dutasteride (AVODART) 0.5 MG capsule Take 0.5 mg by mouth daily.  Marland Kitchen HYDROcodone-acetaminophen (NORCO/VICODIN) 5-325 MG tablet Take 1 tablet by mouth  daily as needed (for pain).   . hydrocortisone (ANUSOL-HC) 25 MG suppository Place 25 mg rectally See admin instructions. UNWRAP AND INSERT 1 SUPPOSITORY RECTALLY TWICE DAILY AS NEEDED FOR HEMORRHOID SYMPTOMS  . levETIRAcetam (KEPPRA) 500 MG tablet Take 1 tablet (500 mg total) by mouth 2 (two) times daily.  Marland Kitchen losartan (COZAAR) 25 MG tablet Take 1 tablet (25 mg total) by mouth daily.  . [DISCONTINUED] atorvastatin (LIPITOR) 80 MG tablet Take 1 tablet (80 mg total) by mouth daily at 6 PM.  . [DISCONTINUED] clopidogrel (PLAVIX) 75 MG tablet Take 1 tablet (75 mg total) by mouth daily.   . [DISCONTINUED] doxazosin (CARDURA) 4 MG tablet Take 4 mg by mouth at bedtime.   . [DISCONTINUED] losartan (COZAAR) 25 MG tablet Take 25 mg by mouth daily.     Allergies:   Patient has no known allergies.   Social History   Socioeconomic History  . Marital status: Married    Spouse name: Not on file  . Number of children: Not on file  . Years of education: Not on file  . Highest education level: Not on file  Occupational History  . Occupation: retired  Scientific laboratory technician  . Financial resource strain: Not on file  . Food insecurity:    Worry: Not on file    Inability: Not on file  . Transportation needs:    Medical: Not on file    Non-medical: Not on file  Tobacco Use  . Smoking status: Never Smoker  . Smokeless tobacco: Never Used  Substance and Sexual Activity  . Alcohol use: Not Currently    Alcohol/week: 0.0 standard drinks  . Drug use: Not Currently  . Sexual activity: Not Currently  Lifestyle  . Physical activity:    Days per week: Not on file    Minutes per session: Not on file  . Stress: Not on file  Relationships  . Social connections:    Talks on phone: Not on file    Gets together: Not on file    Attends religious service: Not on file    Active member of club or organization: Not on file    Attends meetings of clubs or organizations: Not on file    Relationship status: Not on file  Other Topics Concern  . Not on file  Social History Narrative   Lives with his wife in [place], [place]     Family History:  The patient's family history includes Cancer in his mother; Heart disease in his father.   ROS:   Please see the history of present illness.    Review of Systems  Constitution: Negative.  HENT: Negative.   Cardiovascular: Negative.   Respiratory: Negative.   Endocrine: Negative.   Hematologic/Lymphatic: Negative.   Musculoskeletal: Positive for back pain.  Gastrointestinal: Positive for constipation.  Genitourinary: Negative.   Neurological:  Negative.    All other systems reviewed and are negative.   PHYSICAL EXAM:   VS:  BP 124/82   Pulse 75   Ht 5\' 10"  (1.778 m)   Wt 263 lb 12.8 oz (119.7 kg)   SpO2 93%   BMI 37.85 kg/m   Physical Exam  GEN: Obese, in no acute distress   Neck: no JVD, carotid bruits, or masses Cardiac:RRR; no murmurs, rubs, or gallops  Respiratory:  clear to auscultation bilaterally, normal work of breathing GI: soft, nontender, nondistended, + BS Ext: without cyanosis, clubbing, or edema, Good distal pulses bilaterally Neuro:  Alert and Oriented x 3, Psych: euthymic  mood, full affect  Wt Readings from Last 3 Encounters:  02/24/18 263 lb 12.8 oz (119.7 kg)  01/30/18 259 lb 0.7 oz (117.5 kg)  12/02/13 250 lb (113.4 kg)      Studies/Labs Reviewed:   EKG:  EKG is not ordered today.   Recent Labs: 01/29/2018: TSH 0.503 02/01/2018: ALT 21 02/02/2018: BUN 11; Creatinine, Ser 0.89; Hemoglobin 15.3; Platelets 155; Potassium 3.9; Sodium 137   Lipid Panel    Component Value Date/Time   CHOL 207 (H) 01/30/2018 0347   TRIG 321 (H) 01/30/2018 0347   HDL 30 (L) 01/30/2018 0347   CHOLHDL 6.9 01/30/2018 0347   VLDL 64 (H) 01/30/2018 0347   LDLCALC 113 (H) 01/30/2018 0347    Additional studies/ records that were reviewed today include:     Final Interpretation: Right Carotid: Velocities in the right ICA are consistent with a 1-39% stenosis.  Left Carotid: Velocities in the left ICA are consistent with a 1-39% stenosis. ECHO: 11/21/2017 - Left ventricle: The cavity size was normal. There was severe   concentric hypertrophy. Systolic function was vigorous. The   estimated ejection fraction was in the range of 65% to 70%. Wall   motion was normal; there were no regional wall motion   abnormalities. There was an increased relative contribution of   atrial contraction to ventricular filling. Doppler parameters are   consistent with abnormal left ventricular relaxation (grade 1   diastolic  dysfunction). - Aortic valve: Valve mobility was restricted. There was moderate   stenosis. Mean gradient (S): 23 mm Hg. Valve area (VTI): 1.24   cm^2. Valve area (Vmax): 1.01 cm^2. Valve area (Vmean): 1.08   cm^2.  Mean gradient (S): 23 mm Hg. Peak gradient (S): 47 mm Hg. - Aorta: Aortic root dimension: 37 mm (ED). Ascending aorta   diameter: 39 mm (ED). - Aortic root: The aortic root was mildly dilated. - Left atrium: The atrium was moderately dilated. - Pulmonic valve: There was trivial regurgitation. - Pulmonary arteries: Systolic pressure could not be accurately   estimated.     Procedure: Transesophageal Echocardiogram 02/03/2018 Indications: CVA   Normal LV function; calcified aortic valve with reduced cusp excursion; moderate AS (mean gradient 36 mmHg and peak velocity 3.7 m/s by transthoracic; AVA 1.1 cm2 by planimetry) and mild AI; negative saline microcavitation study.    Complications: No apparent complications Patient did tolerate procedure well.   Kirk Ruths, MD       ASSESSMENT:    1. Cerebrovascular accident (CVA) due to thrombosis of precerebral artery (HCC)   2. Chest pain, unspecified type   3. Aortic valve stenosis, etiology of cardiac valve disease unspecified   4. Palpitations   5. Screening cholesterol level   6. Mixed hyperlipidemia   7. Hyperlipidemia LDL goal <70      PLAN:  In order of problems listed above:  CVA due to thrombus, most likely atrial fibrillation but not documented.  Wearing Holter monitor now.  On Plavix and aspirin.  Chest pain with exertion prior to stroke.  Recommend right and left heart catheterization after he recovers from his stroke.  No symptoms since he has been home and he feels like he is more active.  Moderate aortic valve stenosis will require right heart cath after he recovers from his stroke.  Asymptomatic.  Repeat echo in 2 months follow-up with Dr. Meda Coffee after.  Palpitations prior to CVA presumed PAF.  No  recurrence since he has been out of the hospital.  Hyperlipidemia on Lipitor 80 mg daily.  Check fasting lipid panel LFTs in 2 months    Medication Adjustments/Labs and Tests Ordered: Current medicines are reviewed at length with the patient today.  Concerns regarding medicines are outlined above.  Medication changes, Labs and Tests ordered today are listed in the Patient Instructions below. Patient Instructions  Medication Instructions:  Your physician recommends that you continue on your current medications as directed. Please refer to the Current Medication list given to you today.  If you need a refill on your cardiac medications before your next appointment, please call your pharmacy.   Lab work: Your physician recommends that you return for a FASTING lipid profile and liver function panel in 2 MONTHS  If you have labs (blood work) drawn today and your tests are completely normal, you will receive your results only by: Marland Kitchen MyChart Message (if you have MyChart) OR . A paper copy in the mail If you have any lab test that is abnormal or we need to change your treatment, we will call you to review the results.  Testing/Procedures: Your physician has requested that you have an echocardiogram in 2 MONTHS. Echocardiography is a painless test that uses sound waves to create images of your heart. It provides your doctor with information about the size and shape of your heart and how well your heart's chambers and valves are working. This procedure takes approximately one hour. There are no restrictions for this procedure.    Follow-Up: At [facility], you and your health needs are our priority.  As part of our continuing mission to provide you with exceptional heart care, we have created designated Provider Care Teams.  These Care Teams include your primary Cardiologist (physician) and Advanced Practice Providers (APPs -  Physician Assistants and Nurse Practitioners) who all work together  to provide you with the care you need, when you need it. You will need a follow up appointment in January 2020.  Please call our office 2 months in advance to schedule this appointment.  You may see Ena Dawley, MD or one of the following Advanced Practice Providers on your designated Care Team:   [place], PA-C Melina Copa, PA-C . Ermalinda Barrios, PA-C  Any Other Special Instructions Will Be Listed Below (If Applicable).        Sumner Boast, PA-C  02/24/2018 12:59 PM    [facility] [place], [place], [place]  [address] Phone: [phone]; Fax: [phone]

## 2018-03-05 ENCOUNTER — Ambulatory Visit: Payer: Medicare Other | Admitting: Adult Health

## 2018-04-06 ENCOUNTER — Other Ambulatory Visit: Payer: Self-pay

## 2018-04-06 ENCOUNTER — Other Ambulatory Visit: Payer: Medicare Other | Admitting: *Deleted

## 2018-04-06 ENCOUNTER — Ambulatory Visit (HOSPITAL_COMMUNITY): Payer: Medicare Other | Attending: Internal Medicine

## 2018-04-06 ENCOUNTER — Telehealth: Payer: Self-pay | Admitting: Cardiology

## 2018-04-06 DIAGNOSIS — I6300 Cerebral infarction due to thrombosis of unspecified precerebral artery: Secondary | ICD-10-CM

## 2018-04-06 DIAGNOSIS — I350 Nonrheumatic aortic (valve) stenosis: Secondary | ICD-10-CM

## 2018-04-06 DIAGNOSIS — R002 Palpitations: Secondary | ICD-10-CM | POA: Diagnosis present

## 2018-04-06 DIAGNOSIS — Z13220 Encounter for screening for lipoid disorders: Secondary | ICD-10-CM

## 2018-04-06 DIAGNOSIS — R079 Chest pain, unspecified: Secondary | ICD-10-CM | POA: Diagnosis not present

## 2018-04-06 LAB — LIPID PANEL
CHOL/HDL RATIO: 3.1 ratio (ref 0.0–5.0)
Cholesterol, Total: 118 mg/dL (ref 100–199)
HDL: 38 mg/dL — ABNORMAL LOW (ref >39)
LDL CALC: 45 mg/dL (ref 0–99)
TRIGLYCERIDES: 175 mg/dL — AB (ref 0–149)
VLDL CHOLESTEROL CAL: 35 mg/dL (ref 5–40)

## 2018-04-06 LAB — HEPATIC FUNCTION PANEL
ALBUMIN: 3.8 g/dL (ref 3.5–4.8)
ALT: 19 IU/L (ref 0–44)
AST: 14 IU/L (ref 0–40)
Alkaline Phosphatase: 78 IU/L (ref 39–117)
BILIRUBIN TOTAL: 0.6 mg/dL (ref 0.0–1.2)
Bilirubin, Direct: 0.13 mg/dL (ref 0.00–0.40)
TOTAL PROTEIN: 6.5 g/dL (ref 6.0–8.5)

## 2018-04-06 NOTE — Telephone Encounter (Signed)
Follow up  ° ° °Patient is returning your call. °

## 2018-04-06 NOTE — Telephone Encounter (Signed)
New message   Patient is here today for testing and wants to know if he is to continue taking  levETIRAcetam (KEPPRA) 500 MG tablet Take 1 tablet (500 mg total) by mouth 2 (two) times daily.   If so he needs to have a refill called in

## 2018-04-06 NOTE — Telephone Encounter (Signed)
Pt aware needs to contact neurology for refill for Keppra ./cy

## 2018-04-06 NOTE — Telephone Encounter (Signed)
Lm with pt's wife for pt to call back ./cy 

## 2018-04-07 ENCOUNTER — Telehealth: Payer: Self-pay

## 2018-04-07 DIAGNOSIS — I350 Nonrheumatic aortic (valve) stenosis: Secondary | ICD-10-CM

## 2018-04-07 NOTE — Telephone Encounter (Signed)
Pt advised echo results.. Order put in for repeat echo in 3 months

## 2018-04-07 NOTE — Telephone Encounter (Signed)
-----   Message from Dorothy Spark, MD sent at 04/07/2018  1:18 PM EST ----- Moderate to severe aortic stenosis, normal LVEF, repeat echo in 3 months

## 2018-06-04 ENCOUNTER — Encounter: Payer: Self-pay | Admitting: Cardiology

## 2018-06-04 ENCOUNTER — Ambulatory Visit: Payer: Medicare Other | Admitting: Cardiology

## 2018-06-04 VITALS — BP 138/80 | HR 65 | Ht 70.0 in | Wt 267.2 lb

## 2018-06-04 DIAGNOSIS — I350 Nonrheumatic aortic (valve) stenosis: Secondary | ICD-10-CM | POA: Diagnosis not present

## 2018-06-04 DIAGNOSIS — R079 Chest pain, unspecified: Secondary | ICD-10-CM | POA: Diagnosis not present

## 2018-06-04 DIAGNOSIS — I6300 Cerebral infarction due to thrombosis of unspecified precerebral artery: Secondary | ICD-10-CM

## 2018-06-04 DIAGNOSIS — E782 Mixed hyperlipidemia: Secondary | ICD-10-CM | POA: Diagnosis not present

## 2018-06-04 DIAGNOSIS — Q231 Congenital insufficiency of aortic valve: Secondary | ICD-10-CM

## 2018-06-04 DIAGNOSIS — Q2381 Bicuspid aortic valve: Secondary | ICD-10-CM

## 2018-06-04 NOTE — Patient Instructions (Addendum)
Medication Instructions:   Your physician recommends that you continue on your current medications as directed. Please refer to the Current Medication list given to you today.  If you need a refill on your cardiac medications before your next appointment, please call your pharmacy.     Testing/Procedures:  Your physician has requested that you have an echocardiogram. Echocardiography is a painless test that uses sound waves to create images of your heart. It provides your doctor with information about the size and shape of your heart and how well your heart's chambers and valves are working. This procedure takes approximately one hour. There are no restrictions for this procedure.  SCHEDULE THIS A WEEK PRIOR TO YOUR 3 MONTH FOLLOW-UP APPOINTMENT WITH DR Meda Coffee    Follow-Up:  3 MONTHS WITH DR NELSON--ADD TO 09/17/18 AT DR NELSON'S END SLOT--PLEASE ARRANGE ECHO PRIOR TOO

## 2018-06-04 NOTE — Progress Notes (Signed)
Cardiology Office Note    Date:  06/04/2018   ID:  [name], DOB Jul 05, 1945, MRN [id number]  PCP:  Cyndi Bender, PA-C  Cardiologist: Ena Dawley, MD EPS: None  Reason for visit: 3 months follow-up  History of Present Illness:  [name] is a 73 y.o. male with history of hypertension, obesity, rectal bleeding who was admitted to the hospital 01/30/2018 with small acute left MCA infarct highly suspicious for atrial fibrillation.  Readmitted 02/01/2018 with new neuro changes, MRI with propagation of previous CVA, witnessed seizure, Keppra started.  Patient had palpitations and outpatient monitor ordered.  Plan for DAPT for 3 weeks then Plavix alone.  Patient seen by Dr. Meda Coffee in the hospital.  TEE showed moderate aortic stenosis with negative bubble study, aortic valve area 1.1 cm, mild AI, normal LV function.  Mild carotid disease by Dopplers.  OSA starting on CPAP.  He was also having chest pain.  Ultimately he will require right and left cardiac catheterization for evaluation of severe aortic stenosis and possible coronary disease.  Because of ischemic stroke with ongoing dysphasia and significant weakness he needs time to recover from his stroke before undergoing right and left heart cath.  Recommend repeat echo in 3 months and loop recorder recommended.  Patient comes in today accompanied by his wife.  Since his stroke in October he has been doing significantly better, he completed physical therapy and is now walking a mile and half a day without any significant symptoms but feels short of breath when walking upstairs to the second floor in his house.  He denies any dizziness presyncope or syncope.  No lower extremity edema.  He has not had any episodes of chest pain since admission in October.  Past Medical History:  Diagnosis Date  . Arthritis    "hands" (02/04/2018)  . Basal cell carcinoma    "MOHS surgery on sides of my nose" (02/04/2018)  . Chronic lower back pain   . CVA  (cerebral vascular accident) ([place]) 01/29/2018  . CVA (cerebral vascular accident) ([place]) 02/01/2018  . Diarrhea   . Difficulty urinating   . Heart murmur   . High cholesterol   . Hypertension   . OSA on CPAP    "just got CPAP today" (02/04/2018)  . Rectal bleeding   . Rectal pain   . Stroke [facility])    "we were told that MRI showed previous stroke we were not aware that he had" (02/04/2018)    Past Surgical History:  Procedure Laterality Date  . ANTERIOR CERVICAL DECOMP/DISCECTOMY FUSION  2009  . BACK SURGERY  1980's  . EXCISIONAL HEMORRHOIDECTOMY  1989 -2011 X 2 or 3  . LAPAROSCOPIC CHOLECYSTECTOMY  2005  . LUMBAR DISC SURGERY  1980s X 2  . MOHS SURGERY Bilateral 2006   "sides of my nose"  . TEE WITHOUT CARDIOVERSION N/A 02/03/2018   Procedure: TRANSESOPHAGEAL ECHOCARDIOGRAM (TEE);  Surgeon: Lelon Perla, MD;  Location: [facility] ENDOSCOPY;  Service: Cardiovascular;  Laterality: N/A;    Current Medications: Current Meds  Medication Sig  . aspirin EC 81 MG EC tablet Take 1 tablet (81 mg total) by mouth daily.  Marland Kitchen atorvastatin (LIPITOR) 80 MG tablet Take 1 tablet (80 mg total) by mouth daily at 6 PM.  . clopidogrel (PLAVIX) 75 MG tablet Take 1 tablet (75 mg total) by mouth daily.  Marland Kitchen doxazosin (CARDURA) 4 MG tablet Take 1 tablet (4 mg total) by mouth at bedtime.  . dutasteride (AVODART) 0.5 MG  capsule Take 0.5 mg by mouth daily.  Marland Kitchen HYDROcodone-acetaminophen (NORCO/VICODIN) 5-325 MG tablet Take 1 tablet by mouth daily as needed (for pain).   . hydrocortisone (ANUSOL-HC) 25 MG suppository Place 25 mg rectally See admin instructions. UNWRAP AND INSERT 1 SUPPOSITORY RECTALLY TWICE DAILY AS NEEDED FOR HEMORRHOID SYMPTOMS  . levETIRAcetam (KEPPRA) 500 MG tablet Take 500 mg by mouth daily.  Marland Kitchen losartan (COZAAR) 25 MG tablet Take 1 tablet (25 mg total) by mouth daily.     Allergies:   Patient has no known allergies.   Social History   Socioeconomic History  . Marital status: Married     Spouse name: Not on file  . Number of children: Not on file  . Years of education: Not on file  . Highest education level: Not on file  Occupational History  . Occupation: retired  Scientific laboratory technician  . Financial resource strain: Not on file  . Food insecurity:    Worry: Not on file    Inability: Not on file  . Transportation needs:    Medical: Not on file    Non-medical: Not on file  Tobacco Use  . Smoking status: Never Smoker  . Smokeless tobacco: Never Used  Substance and Sexual Activity  . Alcohol use: Not Currently    Alcohol/week: 0.0 standard drinks  . Drug use: Not Currently  . Sexual activity: Not Currently  Lifestyle  . Physical activity:    Days per week: Not on file    Minutes per session: Not on file  . Stress: Not on file  Relationships  . Social connections:    Talks on phone: Not on file    Gets together: Not on file    Attends religious service: Not on file    Active member of club or organization: Not on file    Attends meetings of clubs or organizations: Not on file    Relationship status: Not on file  Other Topics Concern  . Not on file  Social History Narrative   Lives with his wife in [place], [place]     Family History:  The patient's family history includes Cancer in his mother; Heart disease in his father.   ROS:   Please see the history of present illness.    Review of Systems  Constitution: Negative.  HENT: Negative.   Cardiovascular: Negative.   Respiratory: Negative.   Endocrine: Negative.   Hematologic/Lymphatic: Negative.   Musculoskeletal: Positive for back pain.  Gastrointestinal: Positive for constipation.  Genitourinary: Negative.   Neurological: Negative.    All other systems reviewed and are negative.   PHYSICAL EXAM:   VS:  BP 138/80   Pulse 65   Ht 5\' 10"  (1.778 m)   Wt 267 lb 3.2 oz (121.2 kg)   SpO2 94%   BMI 38.34 kg/m   Physical Exam  GEN: Obese, in no acute distress   Neck: no JVD, carotid bruits, or  masses Cardiac:RRR; no murmurs, rubs, or gallops  Respiratory:  clear to auscultation bilaterally, normal work of breathing GI: soft, nontender, nondistended, + BS Ext: without cyanosis, clubbing, or edema, Good distal pulses bilaterally Neuro:  Alert and Oriented x 3, Psych: euthymic mood, full affect  Wt Readings from Last 3 Encounters:  06/04/18 267 lb 3.2 oz (121.2 kg)  02/24/18 263 lb 12.8 oz (119.7 kg)  01/30/18 259 lb 0.7 oz (117.5 kg)      Studies/Labs Reviewed:   EKG:  EKG is not ordered today.   Recent Labs:  01/29/2018: TSH 0.503 02/02/2018: BUN 11; Creatinine, Ser 0.89; Hemoglobin 15.3; Platelets 155; Potassium 3.9; Sodium 137 04/06/2018: ALT 19   Lipid Panel    Component Value Date/Time   CHOL 118 04/06/2018 0813   TRIG 175 (H) 04/06/2018 0813   HDL 38 (L) 04/06/2018 0813   CHOLHDL 3.1 04/06/2018 0813   CHOLHDL 6.9 01/30/2018 0347   VLDL 64 (H) 01/30/2018 0347   LDLCALC 45 04/06/2018 0813    Additional studies/ records that were reviewed today include:     Final Interpretation: Right Carotid: Velocities in the right ICA are consistent with a 1-39% stenosis.  Left Carotid: Velocities in the left ICA are consistent with a 1-39% stenosis. ECHO: 11/21/2017 - Left ventricle: The cavity size was normal. There was severe   concentric hypertrophy. Systolic function was vigorous. The   estimated ejection fraction was in the range of 65% to 70%. Wall   motion was normal; there were no regional wall motion   abnormalities. There was an increased relative contribution of   atrial contraction to ventricular filling. Doppler parameters are   consistent with abnormal left ventricular relaxation (grade 1   diastolic dysfunction). - Aortic valve: Valve mobility was restricted. There was moderate   stenosis. Mean gradient (S): 23 mm Hg. Valve area (VTI): 1.24   cm^2. Valve area (Vmax): 1.01 cm^2. Valve area (Vmean): 1.08   cm^2.  Mean gradient (S): 23 mm Hg. Peak gradient  (S): 47 mm Hg. - Aorta: Aortic root dimension: 37 mm (ED). Ascending aorta   diameter: 39 mm (ED). - Aortic root: The aortic root was mildly dilated. - Left atrium: The atrium was moderately dilated. - Pulmonic valve: There was trivial regurgitation. - Pulmonary arteries: Systolic pressure could not be accurately   estimated.     Procedure: Transesophageal Echocardiogram 02/03/2018 Indications: CVA   Normal LV function; calcified aortic valve with reduced cusp excursion; moderate AS (mean gradient 36 mmHg and peak velocity 3.7 m/s by transthoracic; AVA 1.1 cm2 by planimetry) and mild AI; negative saline microcavitation study.    Complications: No apparent complications Patient did tolerate procedure well.   Kirk Ruths, MD       ASSESSMENT:    1. Aortic valve stenosis, etiology of cardiac valve disease unspecified   2. Cerebrovascular accident (CVA) due to thrombosis of precerebral artery (HCC)   3. Chest pain, unspecified type     PLAN:  In order of problems listed above:  Severe aortic stenosis in the settings of bicuspid aortic valve, mean gradient 37 mmHg.  Peak gradient 61 mmHg, he is now missing S2 on physical exam.  However he is currently minimally symptomatic and wants to wait.  We will see him again in 3 months and repeat echo prior to that visit.  If he becomes symptomatic we will schedule him for right and left cath and refer him to CT surgery and potentially to our TAVR clinic.  He is informed that if his symptoms get worse he should contact us immediately.  CVA due to thrombus, most likely atrial fibrillation but not documented.  Wearing Holter monitor now.  On Plavix and aspirin.  Chest pain with exertion prior to stroke.  As above.   Palpitations prior to CVA presumed PAF.  No recurrence since he has been out of the hospital.   Hyperlipidemia on Lipitor 80 mg daily.  Check fasting lipid panel LFTs in 2 months    Medication Adjustments/Labs and Tests  Ordered: Current medicines are reviewed at length  with the patient today.  Concerns regarding medicines are outlined above.  Medication changes, Labs and Tests ordered today are listed in the Patient Instructions below. Patient Instructions  Medication Instructions:   Your physician recommends that you continue on your current medications as directed. Please refer to the Current Medication list given to you today.  If you need a refill on your cardiac medications before your next appointment, please call your pharmacy.     Testing/Procedures:  Your physician has requested that you have an echocardiogram. Echocardiography is a painless test that uses sound waves to create images of your heart. It provides your doctor with information about the size and shape of your heart and how well your heart's chambers and valves are working. This procedure takes approximately one hour. There are no restrictions for this procedure.  SCHEDULE THIS A WEEK PRIOR TO YOUR 3 MONTH FOLLOW-UP APPOINTMENT WITH DR Meda Coffee    Follow-Up:  3 MONTHS WITH DR Meda Coffee--      Signed, Ena Dawley, MD  06/04/2018 2:41 PM    [facility] [place], [place], [place]  [address] Phone: [phone]; Fax: [phone]

## 2018-07-29 ENCOUNTER — Telehealth: Payer: Self-pay | Admitting: Neurology

## 2018-07-29 NOTE — Telephone Encounter (Signed)
Informed patient Dr Clydene Fake recommendations about Keppra, and to call to schedule appointment to discuss.  Informed him the clinic is doing mostly virtual visits at this time.

## 2018-07-29 NOTE — Telephone Encounter (Signed)
-----   Message from Garvin Fila, MD sent at 07/29/2018  1:39 PM EDT ----- Regarding: RE: Keppra Contact: [phone] I recommend that he reduce the dose of Keppra to 250 mg twice daily.  I believe he is taking currently 500 mg once a day.  He needs to make office follow-up visit to see me or Janett Billow in that visit will decide on stopping Keppra or not ----- Message ----- From: Leeroy Bock Sent: 07/28/2018   1:22 PM EDT To: Garvin Fila, MD Subject: Keppra                                         Patient was prescribed Keppra in the hospital, was taking 2 per day. His PCP gave him a prescription for 1 per day.  Patient questions whether he should continue taking Keppra.  He was enrolled in [place], has already had 6 month visit and no longer in research study.

## 2018-07-29 NOTE — Telephone Encounter (Signed)
Pt called stating Glenda from research told him to schedule an appt for a telephone visit to discuss his medications. Please advise.

## 2018-09-01 ENCOUNTER — Encounter: Payer: Self-pay | Admitting: *Deleted

## 2018-09-01 ENCOUNTER — Telehealth: Payer: Self-pay | Admitting: *Deleted

## 2018-09-01 NOTE — Telephone Encounter (Signed)
Virtual Visit Pre-Appointment Phone Call  "(Name), I am calling you today to discuss your upcoming appointment. We are currently trying to limit exposure to the virus that causes COVID-19 by seeing patients at home rather than in the office."  1. "What is the BEST phone number to call the day of the visit?" - include this in appointment notes-YES UPDATED IN NOTES TO CALL MOBILE PHONE LISTED WHICH IS HIS [place].  2. Do you have or have access to (through a family member/friend) a smartphone with video capability that we can use for your visit?" If yes - list this number in appt notes as cell (if different from BEST phone #) and list the appointment type as a VIDEO visit in appointment notes-YES  3. Confirm consent - "In the setting of the current Covid19 crisis, you are scheduled for a ( video) visit with Dr. Meda Coffee on 09/17/18 at 10 am.  Just as we do with many in-office visits, in order for you to participate in this visit, we must obtain consent.  If you'd like, I can send this to your mychart (if signed up) or email for you to review.  Otherwise, I can obtain your verbal consent now.  All virtual visits are billed to your insurance company just like a normal visit would be.  By agreeing to a virtual visit, we'd like you to understand that the technology does not allow for your provider to perform an examination, and thus may limit your provider's ability to fully assess your condition. If your provider identifies any concerns that need to be evaluated in person, we will make arrangements to do so.  Finally, though the technology is pretty good, we cannot assure that it will always work on either your or our end, and in the setting of a video visit, we may have to convert it to a phone-only visit.  In either situation, we cannot ensure that we have a secure connection.  Are you willing to proceed?" STAFF: Did the patient verbally acknowledge consent to  telehealth visit? Document YES/NO here:YES PT GAVE CONSENT AS WELL AS HE IS AWARE TO REVIEW CONSENT TO TREAT IN HIS MYCHART.  4. Advise patient to be prepared - "Two hours prior to your appointment, go ahead and check your blood pressure, pulse, oxygen saturation, and your weight (if you have the equipment to check those) and write them all down. When your visit starts, your provider will ask you for this information. If you have an Apple Watch or Kardia device, please plan to have heart rate information ready on the day of your appointment. Please have a pen and paper handy nearby the day of the visit as well." YES PT HAS CAPABILITY TO TAKE HIS BP/HR/Wt AND WILL WRITE THIS DOWN PRIOR TO HIS APPT TIME.  5. Give patient instructions for  to smartphone [email or web address] as below if video visit (depending on what platform provider is using)-YES INSTRUCTIONS PROVIDED  6. Inform patient they will receive a phone call 15 minutes prior to their appointment time (may be from unknown caller ID) so they should be prepared to answer-YES    TELEPHONE CALL NOTE  [name] has been deemed a candidate for a follow-up tele-health visit to limit community exposure during the Covid-19 pandemic. I spoke with the patient via phone to ensure availability of phone/video source, confirm preferred email & phone number, and discuss instructions and expectations.  I reminded Eugene Masso  Anderson to be prepared with any vital sign and/or heart rhythm information that could potentially be obtained via home monitoring, at the time of his visit. I reminded [name] to expect a phone call prior to his visit.  Nuala Alpha, LPN 3/38/2505 3:97 PM   IF USING [email or web address] - The patient will receive a link just prior to their visit by text. YES PT AWARE     FULL LENGTH CONSENT FOR TELE-HEALTH VISIT   I hereby voluntarily request, consent and authorize [facility] and its employed or contracted physicians, physician assistants, nurse  practitioners or other licensed health care professionals (the Practitioner), to provide me with telemedicine health care services (the Services") as deemed necessary by the treating Practitioner. I acknowledge and consent to receive the Services by the Practitioner via telemedicine. I understand that the telemedicine visit will involve communicating with the Practitioner through live audiovisual communication technology and the disclosure of certain medical information by electronic transmission. I acknowledge that I have been given the opportunity to request an in-person assessment or other available alternative prior to the telemedicine visit and am voluntarily participating in the telemedicine visit.  I understand that I have the right to withhold or withdraw my consent to the use of telemedicine in the course of my care at any time, without affecting my right to future care or treatment, and that the Practitioner or I may terminate the telemedicine visit at any time. I understand that I have the right to inspect all information obtained and/or recorded in the course of the telemedicine visit and may receive copies of available information for a reasonable fee.  I understand that some of the potential risks of receiving the Services via telemedicine include:   Delay or interruption in medical evaluation due to technological equipment failure or disruption;  Information transmitted may not be sufficient (e.g. poor resolution of images) to allow for appropriate medical decision making by the Practitioner; and/or   In rare instances, security protocols could fail, causing a breach of personal health information.  Furthermore, I acknowledge that it is my responsibility to provide information about my medical history, conditions and care that is complete and accurate to the best of my ability. I acknowledge that Practitioner's advice, recommendations, and/or decision may be based on factors not within  their control, such as incomplete or inaccurate data provided by me or distortions of diagnostic images or specimens that may result from electronic transmissions. I understand that the practice of medicine is not an exact science and that Practitioner makes no warranties or guarantees regarding treatment outcomes. I acknowledge that I will receive a copy of this consent concurrently upon execution via email to the email address I last provided but may also request a printed copy by calling the office of [facility].    I understand that my insurance will be billed for this visit.   I have read or had this consent read to me.  I understand the contents of this consent, which adequately explains the benefits and risks of the Services being provided via telemedicine.   I have been provided ample opportunity to ask questions regarding this consent and the Services and have had my questions answered to my satisfaction.  I give my informed consent for the services to be provided through the use of telemedicine in my medical care  By participating in this telemedicine visit I agree to the above. PT IS AWARE THAT CONSENT TO TREAT WILL BE SENT TO HIS [facility]  ACCOUNT TO REVIEW PRIOR TO HIS APPOINTMENT.

## 2018-09-08 ENCOUNTER — Ambulatory Visit (HOSPITAL_COMMUNITY): Payer: Medicare Other | Attending: Cardiology

## 2018-09-08 ENCOUNTER — Other Ambulatory Visit: Payer: Self-pay

## 2018-09-08 DIAGNOSIS — I6300 Cerebral infarction due to thrombosis of unspecified precerebral artery: Secondary | ICD-10-CM | POA: Diagnosis present

## 2018-09-08 DIAGNOSIS — I350 Nonrheumatic aortic (valve) stenosis: Secondary | ICD-10-CM

## 2018-09-08 DIAGNOSIS — R079 Chest pain, unspecified: Secondary | ICD-10-CM | POA: Diagnosis present

## 2018-09-09 NOTE — Progress Notes (Signed)
Thank you :)

## 2018-09-17 ENCOUNTER — Other Ambulatory Visit: Payer: Self-pay

## 2018-09-17 ENCOUNTER — Telehealth (INDEPENDENT_AMBULATORY_CARE_PROVIDER_SITE_OTHER): Payer: Medicare Other | Admitting: Cardiology

## 2018-09-17 ENCOUNTER — Encounter: Payer: Self-pay | Admitting: *Deleted

## 2018-09-17 ENCOUNTER — Encounter: Payer: Self-pay | Admitting: Cardiology

## 2018-09-17 VITALS — BP 144/78 | HR 69 | Ht 70.0 in | Wt 268.0 lb

## 2018-09-17 DIAGNOSIS — Q231 Congenital insufficiency of aortic valve: Secondary | ICD-10-CM | POA: Diagnosis not present

## 2018-09-17 DIAGNOSIS — I10 Essential (primary) hypertension: Secondary | ICD-10-CM

## 2018-09-17 DIAGNOSIS — E782 Mixed hyperlipidemia: Secondary | ICD-10-CM

## 2018-09-17 DIAGNOSIS — I6300 Cerebral infarction due to thrombosis of unspecified precerebral artery: Secondary | ICD-10-CM

## 2018-09-17 DIAGNOSIS — I350 Nonrheumatic aortic (valve) stenosis: Secondary | ICD-10-CM

## 2018-09-17 MED ORDER — LOSARTAN POTASSIUM 50 MG PO TABS: 50.0000 mg | ORAL_TABLET | Freq: Every day | ORAL | 2 refills | Status: DC

## 2018-09-17 NOTE — Progress Notes (Signed)
Virtual Visit via Video Note   This visit type was conducted due to national recommendations for restrictions regarding the COVID-19 Pandemic (e.g. social distancing) in an effort to limit this patient's exposure and mitigate transmission in our community.  Due to his co-morbid illnesses, this patient is at least at moderate risk for complications without adequate follow up.  This format is felt to be most appropriate for this patient at this time.  All issues noted in this document were discussed and addressed.  A limited physical exam was performed with this format.  Please refer to the patient's chart for his consent to telehealth for [facility].   Date:  09/17/2018   ID:  [name], DOB 1946-04-01, MRN [id number]  Patient Location: Home Provider Location: Home  PCP:  Cyndi Bender, PA-C  Cardiologist:  Ena Dawley, MD  Electrophysiologist:  None   Evaluation Performed:  Follow-Up Visit  Chief Complaint:  SOB, chest tightness  History of Present Illness:    [name] is a 73 y.o. male with history of hypertension, obesity, rectal bleeding who was admitted to the hospital 01/30/2018 with small acute left MCA infarct highly suspicious for atrial fibrillation.  Readmitted 02/01/2018 with new neuro changes, MRI with propagation of previous CVA, witnessed seizure, Keppra started.  Patient had palpitations and outpatient monitor ordered.  Plan for DAPT for 3 weeks then Plavix alone.  Patient seen by Dr. Meda Coffee in the hospital.  TEE showed moderate aortic stenosis with negative bubble study, aortic valve area 1.1 cm, mild AI, normal LV function.  Mild carotid disease by Dopplers.  OSA starting on CPAP.  He was also having chest pain.  Ultimately he will require right and left cardiac catheterization for evaluation of severe aortic stenosis and possible coronary disease.  Because of ischemic stroke with ongoing dysphasia and significant weakness he needs time to recover from his stroke  before undergoing right and left heart cath.  Recommend repeat echo in 3 months and loop recorder recommended.  09/17/2018 -the patient states that he has been progressively getting more tired and short of breath with minimal exertion, sometimes with exertion he gets chest tightness, no dizziness presyncope or syncope.  No lower extremity edema orthopnea proximal nocturnal dyspnea.  He has been compliant with his medications, his blood pressure has been running in 1 40-1 50 range.  The patient does not have symptoms concerning for COVID-19 infection (fever, chills, cough, or new shortness of breath).    Past Medical History:  Diagnosis Date  . Arthritis    "hands" (02/04/2018)  . Basal cell carcinoma    "MOHS surgery on sides of my nose" (02/04/2018)  . Chronic lower back pain   . CVA (cerebral vascular accident) ([place]) 01/29/2018  . CVA (cerebral vascular accident) ([place]) 02/01/2018  . Diarrhea   . Difficulty urinating   . Heart murmur   . High cholesterol   . Hypertension   . OSA on CPAP    "just got CPAP today" (02/04/2018)  . Rectal bleeding   . Rectal pain   . Stroke [facility])    "we were told that MRI showed previous stroke we were not aware that he had" (02/04/2018)   Past Surgical History:  Procedure Laterality Date  . ANTERIOR CERVICAL DECOMP/DISCECTOMY FUSION  2009  . BACK SURGERY  1980's  . EXCISIONAL HEMORRHOIDECTOMY  1989 -2011 X 2 or 3  . LAPAROSCOPIC CHOLECYSTECTOMY  2005  . LUMBAR DISC SURGERY  1980s X 2  .  MOHS SURGERY Bilateral 2006   "sides of my nose"  . TEE WITHOUT CARDIOVERSION N/A 02/03/2018   Procedure: TRANSESOPHAGEAL ECHOCARDIOGRAM (TEE);  Surgeon: Lelon Perla, MD;  [facility] END[facility]COPY;  Service: Cardiovascular;  Laterality: N/A;     Current Meds  Medication Sig  . acetaminophen (TYLENOL) 500 MG tablet Take 500 mg by mouth as needed.  Marland Kitchen aspirin EC 81 MG EC tablet Take 1 tablet (81 mg total) by mouth daily.  Marland Kitchen atorvastatin (LIPITOR) 80 MG tablet Take  1 tablet (80 mg total) by mouth daily at 6 PM.  . clopidogrel (PLAVIX) 75 MG tablet Take 1 tablet (75 mg total) by mouth daily.  Marland Kitchen doxazosin (CARDURA) 4 MG tablet Take 1 tablet (4 mg total) by mouth at bedtime.  . dutasteride (AVODART) 0.5 MG capsule Take 0.5 mg by mouth daily.  . hydrocortisone (ANUSOL-HC) 25 MG suppository Place 25 mg rectally See admin instructions. UNWRAP AND INSERT 1 SUPPOSITORY RECTALLY TWICE DAILY AS NEEDED FOR HEMORRHOID SYMPTOMS  . levETIRAcetam (KEPPRA) 500 MG tablet Take 250 mg by mouth daily.   . Lidocaine, Anorectal, (RECTICARE) 5 % CREA Apply 1 application topically as needed.  Marland Kitchen losartan (COZAAR) 25 MG tablet Take 1 tablet (25 mg total) by mouth daily.  . phenylephrine-shark liver oil-mineral oil-petrolatum (PREPARATION H) 0.25-3-14-71.9 % rectal ointment Place 1 application rectally as needed for hemorrhoids.     Allergies:   Patient has no known allergies.   Social History   Tobacco Use  . Smoking status: Never Smoker  . Smokeless tobacco: Never Used  Substance Use Topics  . Alcohol use: Not Currently    Alcohol/week: 0.0 standard drinks  . Drug use: Not Currently     Family Hx: The patient's family history includes Cancer in his mother; Heart disease in his father. There is no history of Stroke.  ROS:   Please see the history of present illness.    All other systems reviewed and are negative.   Prior CV studies:   The following studies were reviewed today:    Labs/Other Tests and Data Reviewed:    EKG:  No ECG reviewed.  Recent Labs: 01/29/2018: TSH 0.503 02/02/2018: BUN 11; Creatinine, Ser 0.89; Hemoglobin 15.3; Platelets 155; Potassium 3.9; Sodium 137 04/06/2018: ALT 19   Recent Lipid Panel Lab Results  Component Value Date/Time   CHOL 118 04/06/2018 08:13 AM   TRIG 175 (H) 04/06/2018 08:13 AM   HDL 38 (L) 04/06/2018 08:13 AM   CHOLHDL 3.1 04/06/2018 08:13 AM   CHOLHDL 6.9 01/30/2018 03:47 AM   LDLCALC 45 04/06/2018 08:13 AM     Wt Readings from Last 3 Encounters:  09/17/18 268 lb (121.6 kg)  06/04/18 267 lb 3.2 oz (121.2 kg)  02/24/18 263 lb 12.8 oz (119.7 kg)     Objective:    Vital Signs:  BP (!) 144/78   Pulse 69   Ht 5\' 10"  (1.778 m)   Wt 268 lb (121.6 kg)   BMI 38.45 kg/m    VITAL SIGNS:  reviewed  ASSESSMENT & PLAN:    Severe aortic stenosis in the settings of bicuspid aortic valve, peak/mean gradient 59/39 mmHg consistent with severe aortic stenosis, on last in person visit he was missing S2.  We will arrange for him to be seen by either Dr. Burt Knack or Dr. [name] in the structural heart clinic for evaluation of TAVR, he will require left and right cardiac catheterization.  CVA due to thrombus, most likely atrial fibrillation but not  documented.    On Plavix and aspirin.  Hypertension -I will increase losartan to 50 mg daily.  Chest pain with exertion prior to stroke.  As above.  Palpitations prior to CVA presumed PAF.  No recurrence since he has been out of the hospital.  Hyperlipidemia -on atorvastatin 80 mg daily.  COVID-19 Education: The signs and symptoms of COVID-19 were discussed with the patient and how to seek care for testing (follow up with PCP or arrange E-visit).  The importance of social distancing was discussed today.  Time:   Today, I have spent 25 minutes with the patient with telehealth technology discussing the above problems.     Medication Adjustments/Labs and Tests Ordered: Current medicines are reviewed at length with the patient today.  Concerns regarding medicines are outlined above.   Tests Ordered: No orders of the defined types were placed in this encounter.   Medication Changes: No orders of the defined types were placed in this encounter.   Disposition:  Follow up in 4 month(s)  Signed, Ena Dawley, MD  09/17/2018 9:58 AM    [facility]

## 2018-09-17 NOTE — Patient Instructions (Addendum)
Medication Instructions:   INCREASE YOUR LOSARTAN TO 50 MG ONCE DAILY  If you need a refill on your cardiac medications before your next appointment, please call your pharmacy.     You have been referred to DR. Burt Knack OR DR. MCALHANY FOR TAVR CONSULT--OUR STRUCTURAL HEART TEAM WILL BE CALLING YOU SOON TO ARRANGE FOR YOUR CONSULT APPOINTMENT WITH ONE OF THESE PHYSICIANS--LAUREN BROWN RN OR KATY KEMP RN WILL BE CALLING YOU TO ARRANGE THIS APPOINTMENT    Follow-Up:  IN 4 MONTHS WITH DR NELSON AS A REGULAR OFFICE VISIT ON Tuesday January 26, 2019 AT 9:00 AM

## 2018-09-17 NOTE — Addendum Note (Signed)
Addended by: Nuala Alpha on: 09/17/2018 10:46 AM   Modules accepted: Orders

## 2018-09-23 ENCOUNTER — Telehealth: Payer: Self-pay | Admitting: *Deleted

## 2018-09-23 NOTE — Telephone Encounter (Signed)
-----   Message from Theodoro Parma, RN sent at 09/23/2018  5:24 PM EDT ----- Regarding: RE: [place] He's scheduled next week with [facility]! ----- Message ----- From: Nuala Alpha, LPN Sent: 8/33/8250  10:38 AM EDT To: Barkley Boards, RN, Theodoro Parma, RN Subject: REFER FOR TAVR CONSULT PER DR NELSON           This pt did a virtual with Meda Coffee today and he will need to be referred to [facility] for TAVR Consult for severe aortic stenosis.  Pt is aware that you will reach out to him and arrange this virtual consult.  Can you please schedule?  Thanks ladies, Karlene Einstein

## 2018-09-30 ENCOUNTER — Telehealth: Payer: Self-pay | Admitting: *Deleted

## 2018-09-30 NOTE — Progress Notes (Signed)
Structural Clinic Consult Note  Chief Complaint  Patient presents with  . New Patient (Initial Visit)    Severe aortic stenosis   History of Present Illness: 73 yo male with history of skin cancer, obesity, prior CVA, hyperlipidemia, HTN, sleep apnea and severe aortic stenosis who is here today in the valve clinic as a new consult for further evaluation of his aortic stenosis and discussion regarding possible TAVR. He is followed in our cardiology office by Dr. Meda Coffee. He was admitted to [facility] September 2019 with a stroke. TEE in September 2019 with moderate aortic stenosis (mean gradient 36 mmHg, peak velocity 3.7 m/sec) and normal LV systolic function. There was mild aortic valve insufficiency. No atrial arrhythmias have been documented. He had a seizure in the setting of his acute stroke and has been on Keppra since then. Repeat echo December 2019 with LVEF=60-65% and moderate AS with mean gradient 37 mmHg, peak gradient 61 mmHg, AVA 1.24 cm2. He was seen by Dr. Meda Coffee with a Telemedicine visit on 09/17/18 and reported progressive fatigue as well as dyspnea on exertion and chest pressure with exertion. Echo May 2020 with LVEF=60-65%. The aortic valve appears to be bicuspid. Moderately severe aortic stenosis with mean gradient 39 mmHg, dimensionless index 0.26. AVA 1.25 cm2). The valve leaflets are thickened and calcified.   He tells me today that he has been very active but has been having dizzy spells. He has mild dyspnea with exertion. No chest pain. He has no lower extremity edema. He goes to the dentist regularly. Lives with his wife in [place], [place]. He is retired as a Education officer, museum.   Primary Care Physician: Cyndi Bender, PA-C Primary Cardiology: Filiberto Pinks Referring Cardiology: Meda Coffee  Past Medical History:  Diagnosis Date  . Arthritis    "hands" (02/04/2018)  . Basal cell carcinoma    "MOHS surgery on sides of my nose" (02/04/2018)  . Chronic lower back pain   . CVA (cerebral  vascular accident) ([place]) 01/29/2018  . CVA (cerebral vascular accident) ([place]) 02/01/2018  . Diarrhea   . Difficulty urinating   . Heart murmur   . High cholesterol   . Hypertension   . OSA on CPAP    "just got CPAP today" (02/04/2018)  . Rectal bleeding   . Rectal pain   . Stroke [facility])    "we were told that MRI showed previous stroke we were not aware that he had" (02/04/2018)    Past Surgical History:  Procedure Laterality Date  . ANTERIOR CERVICAL DECOMP/DISCECTOMY FUSION  2009  . BACK SURGERY  1980's  . EXCISIONAL HEMORRHOIDECTOMY  1989 -2011 X 2 or 3  . LAPAROSCOPIC CHOLECYSTECTOMY  2005  . LUMBAR DISC SURGERY  1980s X 2  . MOHS SURGERY Bilateral 2006   "sides of my nose"  . TEE WITHOUT CARDIOVERSION N/A 02/03/2018   Procedure: TRANSESOPHAGEAL ECHOCARDIOGRAM (TEE);  Surgeon: Lelon Perla, MD;  Location: [facility] ENDOSCOPY;  Service: Cardiovascular;  Laterality: N/A;    Current Outpatient Medications  Medication Sig Dispense Refill  . acetaminophen (TYLENOL) 500 MG tablet Take 500 mg by mouth as needed.    Marland Kitchen aspirin EC 81 MG EC tablet Take 1 tablet (81 mg total) by mouth daily. 30 tablet 3  . atorvastatin (LIPITOR) 80 MG tablet Take 1 tablet (80 mg total) by mouth daily at 6 PM. 90 tablet 3  . clopidogrel (PLAVIX) 75 MG tablet Take 1 tablet (75 mg total) by mouth daily. 90 tablet 3  . doxazosin (CARDURA)  4 MG tablet Take 1 tablet (4 mg total) by mouth at bedtime. 90 tablet 3  . dutasteride (AVODART) 0.5 MG capsule Take 0.5 mg by mouth daily.  3  . hydrocortisone (ANUSOL-HC) 25 MG suppository Place 25 mg rectally See admin instructions. UNWRAP AND INSERT 1 SUPPOSITORY RECTALLY TWICE DAILY AS NEEDED FOR HEMORRHOID SYMPTOMS  11  . levETIRAcetam (KEPPRA) 500 MG tablet Take 250 mg by mouth daily.     . Lidocaine, Anorectal, (RECTICARE) 5 % CREA Apply 1 application topically as needed.    Marland Kitchen losartan (COZAAR) 50 MG tablet Take 1 tablet (50 mg total) by mouth daily. 90 tablet 2   No  current facility-administered medications for this visit.     No Known Allergies  Social History   Socioeconomic History  . Marital status: Married    Spouse name: Not on file  . Number of children: Not on file  . Years of education: Not on file  . Highest education level: Not on file  Occupational History  . Occupation: retired  Scientific laboratory technician  . Financial resource strain: Not on file  . Food insecurity:    Worry: Not on file    Inability: Not on file  . Transportation needs:    Medical: Not on file    Non-medical: Not on file  Tobacco Use  . Smoking status: Never Smoker  . Smokeless tobacco: Never Used  Substance and Sexual Activity  . Alcohol use: Not Currently    Alcohol/week: 0.0 standard drinks  . Drug use: Not Currently  . Sexual activity: Not Currently  Lifestyle  . Physical activity:    Days per week: Not on file    Minutes per session: Not on file  . Stress: Not on file  Relationships  . Social connections:    Talks on phone: Not on file    Gets together: Not on file    Attends religious service: Not on file    Active member of club or organization: Not on file    Attends meetings of clubs or organizations: Not on file    Relationship status: Not on file  . Intimate partner violence:    Fear of current or ex partner: Not on file    Emotionally abused: Not on file    Physically abused: Not on file    Forced sexual activity: Not on file  Other Topics Concern  . Not on file  Social History Narrative   Lives with his wife in [place], [place]    Family History  Problem Relation Age of Onset  . Cancer Mother        breast/colon  . Heart disease Father   . Stroke Neg Hx     Review of Systems:  As stated in the HPI and otherwise negative.   BP (!) 154/92   Pulse 69   Ht 5\' 10"  (1.778 m)   Wt 271 lb 12.8 oz (123.3 kg)   SpO2 95%   BMI 39.00 kg/m   Physical Examination: General: Well developed, well nourished, NAD  HEENT: OP clear, mucus membranes  moist  SKIN: warm, dry. No rashes. Neuro: No focal deficits  Musculoskeletal: Muscle strength 5/5 all ext  Psychiatric: Mood and affect normal  Neck: No JVD, no carotid bruits, no thyromegaly, no lymphadenopathy.  Lungs:Clear bilaterally, no wheezes, rhonci, crackles Cardiovascular: Regular rate and rhythm. Loud, harsh systolic murmur.  Abdomen:Soft. Bowel sounds present. Non-tender.  Extremities: No lower extremity edema. Pulses are 2 + in the bilateral  DP/PT.  EKG:  EKG is ordered today. The ekg ordered today demonstrates NSR, rate 69 bpm.   Echo 09/08/18:  1. The left ventricle has normal systolic function with an ejection fraction of 60-65%. The cavity size was normal. There is moderately increased left ventricular wall thickness. Left ventricular diastolic Doppler parameters are consistent with impaired  relaxation.  2. The right ventricle has normal systolic function. The cavity was normal.  3. The mitral valve is grossly normal.  4. The tricuspid valve is grossly normal.  5. The aortic valveis bicuspid. Moderate calcification of the aortic valve. Aortic valve regurgitation is trivial by color flow Doppler. Moderate-severe stenosis of the aortic valve.  6. There is mild dilatation of the ascending aorta and of the aortic root measuring 41 mm.  7. Normal LV function; moderate LVH; moderate diastolic dysfunction; mildly dilated aortic root; functionally bicuspid aortic valve with fusion or left and right cusps; moderate to severe AS (mean gradient 39 mmHg and peak velocity of 4 m/s).  FINDINGS  Left Ventricle: The left ventricle has normal systolic function, with an ejection fraction of 60-65%. The cavity size was normal. There is moderately increased left ventricular wall thickness. Left ventricular diastolic Doppler parameters are consistent  with impaired relaxation.  Right Ventricle: The right ventricle has normal systolic function. The cavity was normal.  Left Atrium: Left  atrial size was normal in size.  Right Atrium: Right atrial size was normal in size. Right atrial pressure is estimated at 8 mmHg.  Interatrial Septum: No atrial level shunt detected by color flow Doppler.  Pericardium: There is no evidence of pericardial effusion.  Mitral Valve: The mitral valve is grossly normal. Mitral valve regurgitation is trivial by color flow Doppler.  Tricuspid Valve: The tricuspid valve is grossly normal. Tricuspid valve regurgitation is trivial by color flow Doppler.  Aortic Valve: The aortic valveis bicuspid Moderate calcification of the aortic valve. Aortic valve regurgitation is trivial by color flow Doppler. There is Moderate-severe stenosis of the aortic valve, with a calculated valve area of 1.25 cm.  Pulmonic Valve: The pulmonic valve was grossly normal. Pulmonic valve regurgitation is trivial by color flow Doppler.  Aorta: There is mild dilatation of the ascending aorta and of the aortic root measuring 41 mm.  Venous: The inferior vena cava is normal in size with greater than 50% respiratory variability.    +--------------+--------++ LEFT VENTRICLE         +----------------+---------++ +--------------+--------++ Diastology                PLAX 2D                +----------------+---------++ +--------------+--------++ LV e' lateral:  4.13 cm/s LVIDd:        4.06 cm  +----------------+---------++ +--------------+--------++ LV E/e' lateral:19.0      LVIDs:        2.55 cm  +----------------+---------++ +--------------+--------++ LV e' medial:   5.98 cm/s LV PW:        1.76 cm  +----------------+---------++ +--------------+--------++ LV E/e' medial: 13.1      LV IVS:       1.50 cm  +----------------+---------++ +--------------+--------++ LVOT diam:    2.50 cm  +--------------+--------++ LV SV:        49 ml    +--------------+--------++ LV SV Index:  19.65     +--------------+--------++ LVOT Area:    4.91 cm +--------------+--------++                        +--------------+--------++  +---------------+----------++  RIGHT VENTRICLE           +---------------+----------++ RV S prime:    15.90 cm/s +---------------+----------++ TAPSE (M-mode):2.4 cm     +---------------+----------++  +---------------+-------++-----------++ LEFT ATRIUM           Index       +---------------+-------++-----------++ LA diam:       5.00 cm2.12 cm/m  +---------------+-------++-----------++ LA Vol (A2C):  57.7 ml24.44 ml/m +---------------+-------++-----------++ LA Vol (A4C):  64.2 ml27.19 ml/m +---------------+-------++-----------++ LA Biplane Vol:61.2 ml25.92 ml/m +---------------+-------++-----------++ +------------+---------++-----------++ RIGHT ATRIUM         Index       +------------+---------++-----------++ RA Area:    14.60 cm            +------------+---------++-----------++ RA Volume:  33.50 ml 14.19 ml/m +------------+---------++-----------++  +------------------+------------++ AORTIC VALVE                   +------------------+------------++ AV Area (Vmax):   1.39 cm     +------------------+------------++ AV Area (Vmean):  1.31 cm     +------------------+------------++ AV Area (VTI):    1.25 cm     +------------------+------------++ AV Vmax:          384.60 cm/s  +------------------+------------++ AV Vmean:         245.800 cm/s +------------------+------------++ AV VTI:           0.945 m      +------------------+------------++ AV Peak Grad:     59.2 mmHg    +------------------+------------++ AV Mean Grad:     39.0 mmHg    +------------------+------------++ LVOT Vmax:        109.00 cm/s  +------------------+------------++ LVOT Vmean:       65.800 cm/s  +------------------+------------++ LVOT VTI:          0.241 m      +------------------+------------++ LVOT/AV VTI ratio:0.26         +------------------+------------++   +-------------+-------++ AORTA                +-------------+-------++ Ao Root diam:4.10 cm +-------------+-------++ Ao Asc diam: 3.80 cm +-------------+-------++  +--------------+--------++ MITRAL VALVE             +--------------+-------+ +--------------+--------++   SHUNTS                MV Area (PHT):cm        +--------------+-------+ +--------------+--------++   Systemic VTI: 0.24 m  MV PHT:       msec       +--------------+-------+ +--------------+--------++   Systemic Diam:2.50 cm MV Decel Time:263 msec   +--------------+-------+ +--------------+--------++ +--------------+----------++ MV E velocity:78.55 cm/s +--------------+----------++ MV A velocity:99.75 cm/s +--------------+----------++ MV E/A ratio: 0.79       +--------------+----------++   Recent Labs: 01/29/2018: TSH 0.503 02/02/2018: BUN 11; Creatinine, Ser 0.89; Hemoglobin 15.3; Platelets 155; Potassium 3.9; Sodium 137 04/06/2018: ALT 19    Wt Readings from Last 3 Encounters:  10/01/18 271 lb 12.8 oz (123.3 kg)  09/17/18 268 lb (121.6 kg)  06/04/18 267 lb 3.2 oz (121.2 kg)     Other studies Reviewed: Additional studies/ records that were reviewed today include: Echo images, office notes Review of the above records demonstrates: severe aortic stenosis  Assessment and Plan:   1. Severe aortic stenosis: He has severe, stage D aortic valve stenosis. I have personally reviewed the echo images. The aortic valve is thickened, calcified with limited leaflet mobility. I think he would benefit from AVR. Given advanced age, He would be a reasonable candidate for surgical AVR but given advanced  age, TAVR would be preferable.  I think he may be a good candidate for TAVR.   STS Risk Score:  Risk of Mortality: 1.751% Renal Failure: 1.668%  Permanent Stroke: 1.961% Prolonged Ventilation: 7.236% DSW Infection: 0.141% Reoperation: 4.930% Morbidity or Mortality: 12.725% Short Length of Stay: 41.251% Long Length of Stay: 3.116%  I have reviewed the natural history of aortic stenosis with the patient and their family members  who are present today. We have discussed the limitations of medical therapy and the poor prognosis associated with symptomatic aortic stenosis. We have reviewed potential treatment options, including palliative medical therapy, conventional surgical aortic valve replacement, and transcatheter aortic valve replacement. We discussed treatment options in the context of the patient's specific comorbid medical conditions.   He would like to proceed with planning for TAVR. I will arrange a right and left heart catheterization at [facility] . He will call back with a date that works. Risks and benefits of the cath procedure and the TAVR procedure reviewed with the patient. After the cath, he will have a cardiac CT, CTA of the chest/abdomen and pelvis, PFTs and will then be referred to see one of the CT surgeons on our TAVR team.   Current medicines are reviewed at length with the patient today.  The patient does not have concerns regarding medicines.  The following changes have been made:  no change  Labs/ tests ordered today include:   Orders Placed This Encounter  Procedures  . CBC  . Basic Metabolic Panel (BMET)  . EKG 12-Lead     Disposition:   FU with the valve team post cath    Signed, Lauree Chandler, MD 10/01/2018 9:45 AM    [facility] [address], [place], [place]  [address] Phone: [phone]; Fax: [phone]

## 2018-09-30 NOTE — H&P (View-Only) (Signed)
Structural Clinic Consult Note  Chief Complaint  Patient presents with   New Patient (Initial Visit)    Severe aortic stenosis   History of Present Illness: 73 yo male with history of skin cancer, obesity, prior CVA, hyperlipidemia, HTN, sleep apnea and severe aortic stenosis who is here today in the valve clinic as a new consult for further evaluation of his aortic stenosis and discussion regarding possible TAVR. He is followed in our cardiology office by Dr. Meda Coffee. He was admitted to [facility] September 2019 with a stroke. TEE in September 2019 with moderate aortic stenosis (mean gradient 36 mmHg, peak velocity 3.7 m/sec) and normal LV systolic function. There was mild aortic valve insufficiency. No atrial arrhythmias have been documented. He had a seizure in the setting of his acute stroke and has been on Keppra since then. Repeat echo December 2019 with LVEF=60-65% and moderate AS with mean gradient 37 mmHg, peak gradient 61 mmHg, AVA 1.24 cm2. He was seen by Dr. Meda Coffee with a Telemedicine visit on 09/17/18 and reported progressive fatigue as well as dyspnea on exertion and chest pressure with exertion. Echo May 2020 with LVEF=60-65%. The aortic valve appears to be bicuspid. Moderately severe aortic stenosis with mean gradient 39 mmHg, dimensionless index 0.26. AVA 1.25 cm2). The valve leaflets are thickened and calcified.   He tells me today that he has been very active but has been having dizzy spells. He has mild dyspnea with exertion. No chest pain. He has no lower extremity edema. He goes to the dentist regularly. Lives with his wife in [place], [place]. He is retired as a Education officer, museum.   Primary Care Physician: Cyndi Bender, PA-C Primary Cardiology: Filiberto Pinks Referring Cardiology: Meda Coffee  Past Medical History:  Diagnosis Date   Arthritis    "hands" (02/04/2018)   Basal cell carcinoma    "MOHS surgery on sides of my nose" (02/04/2018)   Chronic lower back pain    CVA (cerebral  vascular accident) ([place]) 01/29/2018   CVA (cerebral vascular accident) ([place]) 02/01/2018   Diarrhea    Difficulty urinating    Heart murmur    High cholesterol    Hypertension    OSA on CPAP    "just got CPAP today" (02/04/2018)   Rectal bleeding    Rectal pain    Stroke [facility])    "we were told that MRI showed previous stroke we were not aware that he had" (02/04/2018)    Past Surgical History:  Procedure Laterality Date   ANTERIOR CERVICAL DECOMP/DISCECTOMY FUSION  2009   BACK SURGERY  1980's   EXCISIONAL HEMORRHOIDECTOMY  1989 -2011 X 2 or 3   LAPAROSCOPIC CHOLECYSTECTOMY  2005   LUMBAR [place] X 2   MOHS SURGERY Bilateral 2006   "sides of my nose"   TEE WITHOUT CARDIOVERSION N/A 02/03/2018   Procedure: TRANSESOPHAGEAL ECHOCARDIOGRAM (TEE);  Surgeon: Lelon Perla, MD;  Location: [facility] ENDOSCOPY;  Service: Cardiovascular;  Laterality: N/A;    Current Outpatient Medications  Medication Sig Dispense Refill   acetaminophen (TYLENOL) 500 MG tablet Take 500 mg by mouth as needed.     aspirin EC 81 MG EC tablet Take 1 tablet (81 mg total) by mouth daily. 30 tablet 3   atorvastatin (LIPITOR) 80 MG tablet Take 1 tablet (80 mg total) by mouth daily at 6 PM. 90 tablet 3   clopidogrel (PLAVIX) 75 MG tablet Take 1 tablet (75 mg total) by mouth daily. 90 tablet 3   doxazosin (CARDURA)  4 MG tablet Take 1 tablet (4 mg total) by mouth at bedtime. 90 tablet 3   dutasteride (AVODART) 0.5 MG capsule Take 0.5 mg by mouth daily.  3   hydrocortisone (ANUSOL-HC) 25 MG suppository Place 25 mg rectally See admin instructions. UNWRAP AND INSERT 1 SUPPOSITORY RECTALLY TWICE DAILY AS NEEDED FOR HEMORRHOID SYMPTOMS  11   levETIRAcetam (KEPPRA) 500 MG tablet Take 250 mg by mouth daily.      Lidocaine, Anorectal, (RECTICARE) 5 % CREA Apply 1 application topically as needed.     losartan (COZAAR) 50 MG tablet Take 1 tablet (50 mg total) by mouth daily. 90 tablet 2   No  current facility-administered medications for this visit.     No Known Allergies  Social History   Socioeconomic History   Marital status: Married    Spouse name: Not on file   Number of children: Not on file   Years of education: Not on file   Highest education level: Not on file  Occupational History   Occupation: retired  Scientist, product/process development strain: Not on file   Food insecurity:    Worry: Not on file    Inability: Not on file   Transportation needs:    Medical: Not on file    Non-medical: Not on file  Tobacco Use   Smoking status: Never Smoker   Smokeless tobacco: Never Used  Substance and Sexual Activity   Alcohol use: Not Currently    Alcohol/week: 0.0 standard drinks   Drug use: Not Currently   Sexual activity: Not Currently  Lifestyle   Physical activity:    Days per week: Not on file    Minutes per session: Not on file   Stress: Not on file  Relationships   Social connections:    Talks on phone: Not on file    Gets together: Not on file    Attends religious service: Not on file    Active member of club or organization: Not on file    Attends meetings of clubs or organizations: Not on file    Relationship status: Not on file   Intimate partner violence:    Fear of current or ex partner: Not on file    Emotionally abused: Not on file    Physically abused: Not on file    Forced sexual activity: Not on file  Other Topics Concern   Not on file  Social History Narrative   Lives with his wife in [place], [place]    Family History  Problem Relation Age of Onset   Cancer Mother        breast/colon   Heart disease Father    Stroke Neg Hx     Review of Systems:  As stated in the HPI and otherwise negative.   BP (!) 154/92    Pulse 69    Ht 5\' 10"  (1.778 m)    Wt 271 lb 12.8 oz (123.3 kg)    SpO2 95%    BMI 39.00 kg/m   Physical Examination: General: Well developed, well nourished, NAD  HEENT: OP clear, mucus membranes  moist  SKIN: warm, dry. No rashes. Neuro: No focal deficits  Musculoskeletal: Muscle strength 5/5 all ext  Psychiatric: Mood and affect normal  Neck: No JVD, no carotid bruits, no thyromegaly, no lymphadenopathy.  Lungs:Clear bilaterally, no wheezes, rhonci, crackles Cardiovascular: Regular rate and rhythm. Loud, harsh systolic murmur.  Abdomen:Soft. Bowel sounds present. Non-tender.  Extremities: No lower extremity edema. Pulses are  2 + in the bilateral DP/PT.  EKG:  EKG is ordered today. The ekg ordered today demonstrates NSR, rate 69 bpm.   Echo 09/08/18:  1. The left ventricle has normal systolic function with an ejection fraction of 60-65%. The cavity size was normal. There is moderately increased left ventricular wall thickness. Left ventricular diastolic Doppler parameters are consistent with impaired  relaxation.  2. The right ventricle has normal systolic function. The cavity was normal.  3. The mitral valve is grossly normal.  4. The tricuspid valve is grossly normal.  5. The aortic valveis bicuspid. Moderate calcification of the aortic valve. Aortic valve regurgitation is trivial by color flow Doppler. Moderate-severe stenosis of the aortic valve.  6. There is mild dilatation of the ascending aorta and of the aortic root measuring 41 mm.  7. Normal LV function; moderate LVH; moderate diastolic dysfunction; mildly dilated aortic root; functionally bicuspid aortic valve with fusion or left and right cusps; moderate to severe AS (mean gradient 39 mmHg and peak velocity of 4 m/s).  FINDINGS  Left Ventricle: The left ventricle has normal systolic function, with an ejection fraction of 60-65%. The cavity size was normal. There is moderately increased left ventricular wall thickness. Left ventricular diastolic Doppler parameters are consistent  with impaired relaxation.  Right Ventricle: The right ventricle has normal systolic function. The cavity was normal.  Left Atrium: Left  atrial size was normal in size.  Right Atrium: Right atrial size was normal in size. Right atrial pressure is estimated at 8 mmHg.  Interatrial Septum: No atrial level shunt detected by color flow Doppler.  Pericardium: There is no evidence of pericardial effusion.  Mitral Valve: The mitral valve is grossly normal. Mitral valve regurgitation is trivial by color flow Doppler.  Tricuspid Valve: The tricuspid valve is grossly normal. Tricuspid valve regurgitation is trivial by color flow Doppler.  Aortic Valve: The aortic valveis bicuspid Moderate calcification of the aortic valve. Aortic valve regurgitation is trivial by color flow Doppler. There is Moderate-severe stenosis of the aortic valve, with a calculated valve area of 1.25 cm.  Pulmonic Valve: The pulmonic valve was grossly normal. Pulmonic valve regurgitation is trivial by color flow Doppler.  Aorta: There is mild dilatation of the ascending aorta and of the aortic root measuring 41 mm.  Venous: The inferior vena cava is normal in size with greater than 50% respiratory variability.    +--------------+--------++  LEFT VENTRICLE            +----------------+---------++ +--------------+--------++  Diastology                    PLAX 2D                   +----------------+---------++ +--------------+--------++  LV e' lateral:   4.13 cm/s    LVIDd:         4.06 cm    +----------------+---------++ +--------------+--------++  LV E/e' lateral: 19.0         LVIDs:         2.55 cm    +----------------+---------++ +--------------+--------++  LV e' medial:    5.98 cm/s    LV PW:         1.76 cm    +----------------+---------++ +--------------+--------++  LV E/e' medial:  13.1         LV IVS:        1.50 cm    +----------------+---------++ +--------------+--------++  LVOT diam:     2.50 cm    +--------------+--------++  LV SV:         49 ml      +--------------+--------++  LV SV Index:   19.65       +--------------+--------++  LVOT Area:     4.91 cm   +--------------+--------++                            +--------------+--------++  +---------------+----------++  RIGHT VENTRICLE              +---------------+----------++  RV S prime:     15.90 cm/s   +---------------+----------++  TAPSE (M-mode): 2.4 cm       +---------------+----------++  +---------------+-------++-----------++  LEFT ATRIUM              Index         +---------------+-------++-----------++  LA diam:        5.00 cm  2.12 cm/m    +---------------+-------++-----------++  LA Vol (A2C):   57.7 ml  24.44 ml/m   +---------------+-------++-----------++  LA Vol (A4C):   64.2 ml  27.19 ml/m   +---------------+-------++-----------++  LA Biplane Vol: 61.2 ml  25.92 ml/m   +---------------+-------++-----------++ +------------+---------++-----------++  RIGHT ATRIUM            Index         +------------+---------++-----------++  RA Area:     14.60 cm                +------------+---------++-----------++  RA Volume:   33.50 ml   14.19 ml/m   +------------+---------++-----------++  +------------------+------------++  AORTIC VALVE                      +------------------+------------++  AV Area (Vmax):    1.39 cm       +------------------+------------++  AV Area (Vmean):   1.31 cm       +------------------+------------++  AV Area (VTI):     1.25 cm       +------------------+------------++  AV Vmax:           384.60 cm/s    +------------------+------------++  AV Vmean:          245.800 cm/s   +------------------+------------++  AV VTI:            0.945 m        +------------------+------------++  AV Peak Grad:      59.2 mmHg      +------------------+------------++  AV Mean Grad:      39.0 mmHg      +------------------+------------++  LVOT Vmax:         109.00 cm/s    +------------------+------------++  LVOT Vmean:        65.800 cm/s    +------------------+------------++  LVOT VTI:           0.241 m        +------------------+------------++  LVOT/AV VTI ratio: 0.26           +------------------+------------++   +-------------+-------++  AORTA                   +-------------+-------++  Ao Root diam: 4.10 cm   +-------------+-------++  Ao Asc diam:  3.80 cm   +-------------+-------++  +--------------+--------++  MITRAL VALVE                +--------------+-------+ +--------------+--------++    SHUNTS                   MV Area (PHT): cm          +--------------+-------+ +--------------+--------++  Systemic VTI:  0.24 m    MV PHT:        msec         +--------------+-------+ +--------------+--------++    Systemic Diam: 2.50 cm   MV Decel Time: 263 msec     +--------------+-------+ +--------------+--------++ +--------------+----------++  MV E velocity: 78.55 cm/s   +--------------+----------++  MV A velocity: 99.75 cm/s   +--------------+----------++  MV E/A ratio:  0.79         +--------------+----------++   Recent Labs: 01/29/2018: TSH 0.503 02/02/2018: BUN 11; Creatinine, Ser 0.89; Hemoglobin 15.3; Platelets 155; Potassium 3.9; Sodium 137 04/06/2018: ALT 19    Wt Readings from Last 3 Encounters:  10/01/18 271 lb 12.8 oz (123.3 kg)  09/17/18 268 lb (121.6 kg)  06/04/18 267 lb 3.2 oz (121.2 kg)     Other studies Reviewed: Additional studies/ records that were reviewed today include: Echo images, office notes Review of the above records demonstrates: severe aortic stenosis  Assessment and Plan:   1. Severe aortic stenosis: He has severe, stage D aortic valve stenosis. I have personally reviewed the echo images. The aortic valve is thickened, calcified with limited leaflet mobility. I think he would benefit from AVR. Given advanced age, He would be a reasonable candidate for surgical AVR but given advanced age, TAVR would be preferable.  I think he may be a good candidate for TAVR.   STS Risk Score:  Risk of Mortality: 1.751% Renal Failure: 1.668%  Permanent Stroke: 1.961% Prolonged Ventilation: 7.236% DSW Infection: 0.141% Reoperation: 4.930% Morbidity or Mortality: 12.725% Short Length of Stay: 41.251% Long Length of Stay: 3.116%  I have reviewed the natural history of aortic stenosis with the patient and their family members  who are present today. We have discussed the limitations of medical therapy and the poor prognosis associated with symptomatic aortic stenosis. We have reviewed potential treatment options, including palliative medical therapy, conventional surgical aortic valve replacement, and transcatheter aortic valve replacement. We discussed treatment options in the context of the patient's specific comorbid medical conditions.   He would like to proceed with planning for TAVR. I will arrange a right and left heart catheterization at [facility] . He will call back with a date that works. Risks and benefits of the cath procedure and the TAVR procedure reviewed with the patient. After the cath, he will have a cardiac CT, CTA of the chest/abdomen and pelvis, PFTs and will then be referred to see one of the CT surgeons on our TAVR team.   Current medicines are reviewed at length with the patient today.  The patient does not have concerns regarding medicines.  The following changes have been made:  no change  Labs/ tests ordered today include:   Orders Placed This Encounter  Procedures   CBC   Basic Metabolic Panel (BMET)   EKG 12-Lead     Disposition:   FU with the valve team post cath    Signed, Lauree Chandler, MD 10/01/2018 [address], [place], [place]  [address] Phone: [phone]; Fax: [phone]

## 2018-09-30 NOTE — Telephone Encounter (Signed)
    COVID-19 Pre-Screening Questions:  . In the past 7 to 10 days have you had a cough,  shortness of breath, headache, congestion, fever (100 or greater) body aches, chills, sore throat, or sudden loss of taste or sense of smell?-no . Have you been around anyone with known Covid 19.-no . Have you been around anyone who is awaiting Covid 19 test results in the past 7 to 10 days? no . Have you been around anyone who has been exposed to Covid 19, or has mentioned symptoms of Covid 19 within the past 7 to 10 days? no  Pt aware to wear a mask.  [facility] form completed over phone as part of TAVR work up.

## 2018-10-01 ENCOUNTER — Encounter: Payer: Self-pay | Admitting: *Deleted

## 2018-10-01 ENCOUNTER — Encounter: Payer: Self-pay | Admitting: Cardiovascular Disease

## 2018-10-01 ENCOUNTER — Telehealth: Payer: Self-pay | Admitting: *Deleted

## 2018-10-01 ENCOUNTER — Ambulatory Visit: Payer: Medicare Other | Admitting: Cardiovascular Disease

## 2018-10-01 ENCOUNTER — Other Ambulatory Visit: Payer: Self-pay

## 2018-10-01 VITALS — BP 154/92 | HR 69 | Ht 70.0 in | Wt 271.8 lb

## 2018-10-01 DIAGNOSIS — I350 Nonrheumatic aortic (valve) stenosis: Secondary | ICD-10-CM

## 2018-10-01 LAB — BASIC METABOLIC PANEL
BUN/Creatinine Ratio: 12 (ref 10–24)
BUN: 12 mg/dL (ref 8–27)
CO2: 26 mmol/L (ref 20–29)
Calcium: 8.8 mg/dL (ref 8.6–10.2)
Chloride: 102 mmol/L (ref 96–106)
Creatinine, Ser: 1 mg/dL (ref 0.76–1.27)
GFR calc Af Amer: 87 mL/min/{1.73_m2} (ref >59)
GFR calc non Af Amer: 75 mL/min/{1.73_m2} (ref >59)
Glucose: 141 mg/dL — ABNORMAL HIGH (ref 65–99)
Potassium: 4.2 mmol/L (ref 3.5–5.2)
Sodium: 140 mmol/L (ref 134–144)

## 2018-10-01 LAB — CBC
Hematocrit: 44.3 % (ref 37.5–51.0)
Hemoglobin: 15 g/dL (ref 13.0–17.7)
MCH: 31.8 pg (ref 26.6–33.0)
MCHC: 33.9 g/dL (ref 31.5–35.7)
MCV: 94 fL (ref 79–97)
Platelets: 192 10*3/uL (ref 150–450)
RBC: 4.72 x10E6/uL (ref 4.14–5.80)
RDW: 12.5 % (ref 11.6–15.4)
WBC: 8.4 10*3/uL (ref 3.4–10.8)

## 2018-10-01 NOTE — Telephone Encounter (Signed)
Instructions sent to pt through my chart.  I spoke with pt and confirmed he received these. I verbally went over all instructions with pt.  He is aware I will call him with covid testing instructions.

## 2018-10-01 NOTE — Patient Instructions (Addendum)
Medication Instructions:  Your physician recommends that you continue on your current medications as directed. Please refer to the Current Medication list given to you today.  If you need a refill on your cardiac medications before your next appointment, please call your pharmacy.   Lab work: Lab work to be done today--BMP and CBC If you have labs (blood work) drawn today and your tests are completely normal, you will receive your results only by: Marland Kitchen MyChart Message (if you have MyChart) OR . A paper copy in the mail If you have any lab test that is abnormal or we need to change your treatment, we will call you to review the results.  Testing/Procedures: none  Follow-Up:  Please contact us with the date you would like to do the catheterization and we will get this scheduled for you.   Any Other Special Instructions Will Be Listed Below (If Applicable).

## 2018-10-01 NOTE — Telephone Encounter (Signed)
I spoke with pt. He would like to have cath on June 17,2020.  Cath arranged for 10 AM. Pt aware covid testing would be done on June 15 or 16 as drive by at [facility].  Pt will be able to quarantine after testing.

## 2018-10-02 NOTE — Addendum Note (Signed)
Addended by: Lauree Chandler D on: 10/02/2018 10:08 AM   Modules accepted: Orders, SmartSet

## 2018-10-02 NOTE — Telephone Encounter (Signed)
Cath orders placed. Thanks

## 2018-10-06 NOTE — Telephone Encounter (Signed)
I spoke with pt and arranged covid testing for 9:10 AM on June 12,2020 at [facility].  Pt states he will be able to quarantine until procedure. Pt also reports he has difficulty lying on his back for a long period of time due to back surgery.  Also has shortness of breath when lying on back.  I told pt I would make Dr. [name] aware and if this would require any changes to planned cath I would call him back.

## 2018-10-06 NOTE — Telephone Encounter (Signed)
Thanks

## 2018-10-16 ENCOUNTER — Other Ambulatory Visit: Payer: Self-pay

## 2018-10-16 ENCOUNTER — Inpatient Hospital Stay (HOSPITAL_COMMUNITY): Admission: RE | Admit: 2018-10-16 | Payer: Medicare Other | Source: Ambulatory Visit

## 2018-10-16 DIAGNOSIS — I350 Nonrheumatic aortic (valve) stenosis: Secondary | ICD-10-CM

## 2018-10-17 ENCOUNTER — Other Ambulatory Visit (HOSPITAL_COMMUNITY)
Admission: RE | Admit: 2018-10-17 | Discharge: 2018-10-17 | Disposition: A | Discharge status: Home / Self Care | Payer: Medicare Other | Source: Ambulatory Visit | Attending: Cardiovascular Disease | Admitting: Cardiovascular Disease

## 2018-10-17 DIAGNOSIS — Z1159 Encounter for screening for other viral diseases: Secondary | ICD-10-CM | POA: Diagnosis present

## 2018-10-19 LAB — NOVEL CORONAVIRUS, NAA (HOSP ORDER, SEND-OUT TO REF LAB; TAT 18-24 HRS): SARS-CoV-2, NAA: NOT DETECTED

## 2018-10-20 ENCOUNTER — Telehealth: Payer: Self-pay | Admitting: *Deleted

## 2018-10-20 NOTE — Telephone Encounter (Addendum)
Pt contacted pre-catheterization scheduled at [facility] for: Wednesday October 21, 2018 10 AM Verified arrival time and place: [place] Entrance A at: 8 AM  Covid-19 test date: 10/17/18  No solid food after midnight prior to cath, clear liquids until 5 AM day of procedure. Contrast allergy: no  AM meds can be  taken pre-cath with sip of water including: ASA 81 mg Plavix 75 mg  Confirmed patient has responsible person to drive home post procedure and observe 24 hours after arriving home: yes  Due to Covid-19 pandemic no visitors are allowed in the hospital (unless cognitive impairment).  Their designated party will be called when their procedure is over for an update and to arrange pick up.  Patients are required to wear a mask when they enter the hospital.        COVID-19 Pre-Screening Questions:  . In the past 7 to 10 days have you had a cough,  shortness of breath, headache, congestion, fever (100 or greater) body aches, chills, sore throat, or sudden loss of taste or sense of smell? no . Have you been around anyone with known Covid 19? no . Have you been around anyone who is awaiting Covid 19 test results in the past 7 to 10 days? no . Have you been around anyone who has been exposed to Covid 19, or has mentioned symptoms of Covid 19 within the past 7 to 10 days? no   I reviewed procedure instructions/mask/visitor/Covid-19 screening questions with patient, he verbalized understanding.

## 2018-10-20 NOTE — Telephone Encounter (Signed)
[place] study telephone call to patient. Explained the research protocol provided time for patient to ask questions. Emailed the ICF for patient review. I will follow up in am to see if he would like to participate.

## 2018-10-21 ENCOUNTER — Other Ambulatory Visit: Payer: Self-pay

## 2018-10-21 ENCOUNTER — Encounter (HOSPITAL_COMMUNITY)
Admission: RE | Disposition: A | Discharge status: Home / Self Care | Payer: Medicare Other | Source: Home / Self Care | Attending: Cardiovascular Disease

## 2018-10-21 ENCOUNTER — Encounter (HOSPITAL_COMMUNITY): Payer: Self-pay | Admitting: Cardiovascular Disease

## 2018-10-21 ENCOUNTER — Ambulatory Visit (HOSPITAL_COMMUNITY)
Admission: RE | Admit: 2018-10-21 | Discharge: 2018-10-21 | Disposition: A | Discharge status: Home / Self Care | Payer: Medicare Other | Attending: Cardiovascular Disease | Admitting: Cardiovascular Disease

## 2018-10-21 DIAGNOSIS — Z8673 Personal history of transient ischemic attack (TIA), and cerebral infarction without residual deficits: Secondary | ICD-10-CM | POA: Insufficient documentation

## 2018-10-21 DIAGNOSIS — Z79899 Other long term (current) drug therapy: Secondary | ICD-10-CM | POA: Insufficient documentation

## 2018-10-21 DIAGNOSIS — Z7982 Long term (current) use of aspirin: Secondary | ICD-10-CM | POA: Insufficient documentation

## 2018-10-21 DIAGNOSIS — Z6839 Body mass index (BMI) 39.0-39.9, adult: Secondary | ICD-10-CM | POA: Diagnosis not present

## 2018-10-21 DIAGNOSIS — I10 Essential (primary) hypertension: Secondary | ICD-10-CM | POA: Diagnosis not present

## 2018-10-21 DIAGNOSIS — I2510 Atherosclerotic heart disease of native coronary artery without angina pectoris: Secondary | ICD-10-CM

## 2018-10-21 DIAGNOSIS — Z8249 Family history of ischemic heart disease and other diseases of the circulatory system: Secondary | ICD-10-CM | POA: Diagnosis not present

## 2018-10-21 DIAGNOSIS — E7800 Pure hypercholesterolemia, unspecified: Secondary | ICD-10-CM | POA: Diagnosis not present

## 2018-10-21 DIAGNOSIS — E785 Hyperlipidemia, unspecified: Secondary | ICD-10-CM | POA: Diagnosis not present

## 2018-10-21 DIAGNOSIS — R569 Unspecified convulsions: Secondary | ICD-10-CM | POA: Insufficient documentation

## 2018-10-21 DIAGNOSIS — I350 Nonrheumatic aortic (valve) stenosis: Secondary | ICD-10-CM | POA: Insufficient documentation

## 2018-10-21 DIAGNOSIS — G4733 Obstructive sleep apnea (adult) (pediatric): Secondary | ICD-10-CM | POA: Insufficient documentation

## 2018-10-21 DIAGNOSIS — R011 Cardiac murmur, unspecified: Secondary | ICD-10-CM | POA: Insufficient documentation

## 2018-10-21 HISTORY — PX: RIGHT/LEFT HEART CATH AND CORONARY ANGIOGRAPHY: CATH118266

## 2018-10-21 LAB — POCT I-STAT 7, (LYTES, BLD GAS, ICA,H+H)
Acid-Base Excess: 1 mmol/L (ref 0.0–2.0)
Bicarbonate: 27.1 mmol/L (ref 20.0–28.0)
Calcium, Ion: 1.23 mmol/L (ref 1.15–1.40)
HCT: 42 % (ref 39.0–52.0)
Hemoglobin: 14.3 g/dL (ref 13.0–17.0)
O2 Saturation: 99 %
Potassium: 3.7 mmol/L (ref 3.5–5.1)
Sodium: 142 mmol/L (ref 135–145)
TCO2: 29 mmol/L (ref 22–32)
pCO2 arterial: 49 mmHg — ABNORMAL HIGH (ref 32.0–48.0)
pH, Arterial: 7.351 (ref 7.350–7.450)
pO2, Arterial: 176 mmHg — ABNORMAL HIGH (ref 83.0–108.0)

## 2018-10-21 LAB — POCT I-STAT EG7
Acid-base deficit: 2 mmol/L (ref 0.0–2.0)
Bicarbonate: 23.9 mmol/L (ref 20.0–28.0)
Calcium, Ion: 0.96 mmol/L — ABNORMAL LOW (ref 1.15–1.40)
HCT: 38 % — ABNORMAL LOW (ref 39.0–52.0)
Hemoglobin: 12.9 g/dL — ABNORMAL LOW (ref 13.0–17.0)
O2 Saturation: 79 %
Potassium: 3.1 mmol/L — ABNORMAL LOW (ref 3.5–5.1)
Sodium: 147 mmol/L — ABNORMAL HIGH (ref 135–145)
TCO2: 25 mmol/L (ref 22–32)
pCO2, Ven: 44 mmHg (ref 44.0–60.0)
pH, Ven: 7.343 (ref 7.250–7.430)
pO2, Ven: 46 mmHg — ABNORMAL HIGH (ref 32.0–45.0)

## 2018-10-21 SURGERY — RIGHT/LEFT HEART CATH AND CORONARY ANGIOGRAPHY
Anesthesia: LOCAL

## 2018-10-21 MED ORDER — FENTANYL CITRATE (PF) 100 MCG/2ML IJ SOLN
INTRAMUSCULAR | Status: AC
  Filled 2018-10-21: qty 2

## 2018-10-21 MED ORDER — SODIUM CHLORIDE 0.9 % IV SOLN: 250.0000 mL | INTRAVENOUS | Status: DC | PRN

## 2018-10-21 MED ORDER — LIDOCAINE HCL (PF) 1 % IJ SOLN
INTRAMUSCULAR | Status: DC | PRN
  Administered 2018-10-21 (×2): 2 mL

## 2018-10-21 MED ORDER — SODIUM CHLORIDE 0.9% FLUSH: 3.0000 mL | Freq: Two times a day (BID) | INTRAVENOUS | Status: DC

## 2018-10-21 MED ORDER — ASPIRIN 81 MG PO CHEW: 81.0000 mg | CHEWABLE_TABLET | ORAL | Status: DC

## 2018-10-21 MED ORDER — MIDAZOLAM HCL 2 MG/2ML IJ SOLN
INTRAMUSCULAR | Status: DC | PRN
  Administered 2018-10-21: 1 mg via INTRAVENOUS

## 2018-10-21 MED ORDER — HEPARIN SODIUM (PORCINE) 1000 UNIT/ML IJ SOLN
INTRAMUSCULAR | Status: DC | PRN
  Administered 2018-10-21: 5000 [IU] via INTRAVENOUS

## 2018-10-21 MED ORDER — ACETAMINOPHEN 325 MG PO TABS: 650.0000 mg | ORAL_TABLET | ORAL | Status: DC | PRN

## 2018-10-21 MED ORDER — HYDRALAZINE HCL 20 MG/ML IJ SOLN: 10.0000 mg | INTRAMUSCULAR | Status: DC | PRN

## 2018-10-21 MED ORDER — SODIUM CHLORIDE 0.9% FLUSH: 3.0000 mL | INTRAVENOUS | Status: DC | PRN

## 2018-10-21 MED ORDER — VERAPAMIL HCL 2.5 MG/ML IV SOLN
INTRAVENOUS | Status: DC | PRN
  Administered 2018-10-21: 10 mL via INTRA_ARTERIAL

## 2018-10-21 MED ORDER — VERAPAMIL HCL 2.5 MG/ML IV SOLN
INTRAVENOUS | Status: AC
  Filled 2018-10-21: qty 2

## 2018-10-21 MED ORDER — SODIUM CHLORIDE 0.9 % WEIGHT BASED INFUSION
3.0000 mL/kg/h | INTRAVENOUS | Status: AC
  Administered 2018-10-21: 3 mL/kg/h via INTRAVENOUS

## 2018-10-21 MED ORDER — ONDANSETRON HCL 4 MG/2ML IJ SOLN: 4.0000 mg | Freq: Four times a day (QID) | INTRAMUSCULAR | Status: DC | PRN

## 2018-10-21 MED ORDER — HEPARIN SODIUM (PORCINE) 1000 UNIT/ML IJ SOLN
INTRAMUSCULAR | Status: AC
  Filled 2018-10-21: qty 1

## 2018-10-21 MED ORDER — HEPARIN (PORCINE) IN NACL 1000-0.9 UT/500ML-% IV SOLN
INTRAVENOUS | Status: AC
  Filled 2018-10-21: qty 1000

## 2018-10-21 MED ORDER — HEPARIN (PORCINE) IN NACL 1000-0.9 UT/500ML-% IV SOLN
INTRAVENOUS | Status: DC | PRN
  Administered 2018-10-21 (×2): 500 mL

## 2018-10-21 MED ORDER — SODIUM CHLORIDE 0.9 % WEIGHT BASED INFUSION: 1.0000 mL/kg/h | INTRAVENOUS | Status: DC

## 2018-10-21 MED ORDER — LABETALOL HCL 5 MG/ML IV SOLN: 10.0000 mg | INTRAVENOUS | Status: DC | PRN

## 2018-10-21 MED ORDER — FENTANYL CITRATE (PF) 100 MCG/2ML IJ SOLN
INTRAMUSCULAR | Status: DC | PRN
  Administered 2018-10-21: 25 ug via INTRAVENOUS

## 2018-10-21 MED ORDER — LIDOCAINE HCL (PF) 1 % IJ SOLN
INTRAMUSCULAR | Status: AC
  Filled 2018-10-21: qty 30

## 2018-10-21 MED ORDER — SODIUM CHLORIDE 0.9 % IV SOLN: INTRAVENOUS | Status: AC

## 2018-10-21 MED ORDER — IOHEXOL 350 MG/ML SOLN
INTRAVENOUS | Status: DC | PRN
  Administered 2018-10-21: 65 mL via INTRA_ARTERIAL

## 2018-10-21 MED ORDER — MIDAZOLAM HCL 2 MG/2ML IJ SOLN
INTRAMUSCULAR | Status: AC
  Filled 2018-10-21: qty 2

## 2018-10-21 SURGICAL SUPPLY — 16 items
BAG SNAP BAND KOVER 36X36 (MISCELLANEOUS) ×1 IMPLANT
CATH 5FR JL3.5 JR4 ANG PIG MP (CATHETERS) ×1 IMPLANT
CATH BALLN WEDGE 5F 110CM (CATHETERS) ×1 IMPLANT
CATH INFINITI 5FR AL1 (CATHETERS) ×1 IMPLANT
COVER DOME SNAP 22 D (MISCELLANEOUS) ×1 IMPLANT
DEVICE RAD COMP TR BAND LRG (VASCULAR PRODUCTS) ×1 IMPLANT
GLIDESHEATH SLEND SS 6F .021 (SHEATH) ×1 IMPLANT
GUIDEWIRE INQWIRE 1.5J.035X260 (WIRE) IMPLANT
INQWIRE 1.5J .035X260CM (WIRE) ×2
KIT HEART LEFT (KITS) ×2 IMPLANT
PACK CARDIAC CATHETERIZATION (CUSTOM PROCEDURE TRAY) ×2 IMPLANT
SHEATH GLIDE SLENDER 4/5FR (SHEATH) ×1 IMPLANT
TRANSDUCER W/STOPCOCK (MISCELLANEOUS) ×2 IMPLANT
TUBING CIL FLEX 10 FLL-RA (TUBING) ×2 IMPLANT
WIRE EMERALD ST .035X150CM (WIRE) ×1 IMPLANT
WIRE MICROINTRODUCER 60CM (WIRE) ×1 IMPLANT

## 2018-10-21 NOTE — Research (Signed)
[place] Informed Consent   Subject Name: [name]  Subject met inclusion and exclusion criteria.  The informed consent form, study requirements and expectations were reviewed with the subject and questions and concerns were addressed prior to the signing of the consent form.  The subject verbalized understanding of the trial requirements.  The subject agreed to participate in the [facility] trial and signed the informed consent at 0855 on 21-Oct-2018.  The informed consent was obtained prior to performance of any protocol-specific procedures for the subject.  A copy of the signed informed consent was given to the subject and a copy was placed in the subject's medical record.   [place], [name]  21-Oct-2018 [id number]

## 2018-10-21 NOTE — Discharge Instructions (Signed)
Radial Site Care ° °This sheet gives you information about how to care for yourself after your procedure. Your health care provider may also give you more specific instructions. If you have problems or questions, contact your health care provider. °What can I expect after the procedure? °After the procedure, it is common to have: °· Bruising and tenderness at the catheter insertion area. °Follow these instructions at home: °Medicines °· Take over-the-counter and prescription medicines only as told by your health care provider. °Insertion site care °· Follow instructions from your health care provider about how to take care of your insertion site. Make sure you: °? Wash your hands with soap and water before you change your bandage (dressing). If soap and water are not available, use hand sanitizer. °? Change your dressing as told by your health care provider. °? Leave stitches (sutures), skin glue, or adhesive strips in place. These skin closures may need to stay in place for 2 weeks or longer. If adhesive strip edges start to loosen and curl up, you may trim the loose edges. Do not remove adhesive strips completely unless your health care provider tells you to do that. °· Check your insertion site every day for signs of infection. Check for: °? Redness, swelling, or pain. °? Fluid or blood. °? Pus or a bad smell. °? Warmth. °· Do not take baths, swim, or use a hot tub until your health care provider approves. °· You may shower 24-48 hours after the procedure, or as directed by your health care provider. °? Remove the dressing and gently wash the site with plain soap and water. °? Pat the area dry with a clean towel. °? Do not rub the site. That could cause bleeding. °· Do not apply powder or lotion to the site. °Activity ° °· For 24 hours after the procedure, or as directed by your health care provider: °? Do not flex or bend the affected arm. °? Do not push or pull heavy objects with the affected arm. °? Do not  drive yourself home from the hospital or clinic. You may drive 24 hours after the procedure unless your health care provider tells you not to. °? Do not operate machinery or power tools. °· Do not lift anything that is heavier than 10 lb (4.5 kg), or the limit that you are told, until your health care provider says that it is safe. °· Ask your health care provider when it is okay to: °? Return to work or school. °? Resume usual physical activities or sports. °? Resume sexual activity. °General instructions °· If the catheter site starts to bleed, raise your arm and put firm pressure on the site. If the bleeding does not stop, get help right away. This is a medical emergency. °· If you went home on the same day as your procedure, a responsible adult should be with you for the first 24 hours after you arrive home. °· Keep all follow-up visits as told by your health care provider. This is important. °Contact a health care provider if: °· You have a fever. °· You have redness, swelling, or yellow drainage around your insertion site. °Get help right away if: °· You have unusual pain at the radial site. °· The catheter insertion area swells very fast. °· The insertion area is bleeding, and the bleeding does not stop when you hold steady pressure on the area. °· Your arm or hand becomes pale, cool, tingly, or numb. °These symptoms may represent a serious problem   that is an emergency. Do not wait to see if the symptoms will go away. Get medical help right away. Call your local emergency services (911 in the [place] Do not drive yourself to the hospital. °Summary °· After the procedure, it is common to have bruising and tenderness at the site. °· Follow instructions from your health care provider about how to take care of your radial site wound. Check the wound every day for signs of infection. °· Do not lift anything that is heavier than 10 lb (4.5 kg), or the limit that you are told, until your health care provider says  that it is safe. °This information is not intended to replace advice given to you by your health care provider. Make sure you discuss any questions you have with your health care provider. °Document Released: 05/25/2010 Document Revised: 05/28/2017 Document Reviewed: 05/28/2017 °Elsevier Interactive Patient Education © 2019 [employer] ° °

## 2018-10-21 NOTE — Progress Notes (Signed)
D/c instructions reviewed with wife Jana Half via telephone due to [place] restrictions.  All questions answered and Jana Half verbalized understanding

## 2018-10-21 NOTE — Interval H&P Note (Signed)
History and Physical Interval Note:  10/21/2018 8:11 AM  [name]  has presented today for surgery, with the diagnosis of aortic stenosis.  The various methods of treatment have been discussed with the patient and family. After consideration of risks, benefits and other options for treatment, the patient has consented to  Procedure(s): RIGHT/LEFT HEART CATH AND CORONARY ANGIOGRAPHY (N/A) as a surgical intervention.  The patient's history has been reviewed, patient examined, no change in status, stable for surgery.  I have reviewed the patient's chart and labs.  Questions were answered to the patient's satisfaction.    Cath Lab Visit (complete for each Cath Lab visit)  Clinical Evaluation Leading to the Procedure:   ACS: No.  Non-ACS:    Anginal Classification: CCS II  Anti-ischemic medical therapy: No Therapy  Non-Invasive Test Results: No non-invasive testing performed  Prior CABG: No previous CABG        Lauree Chandler

## 2018-10-23 ENCOUNTER — Ambulatory Visit (HOSPITAL_COMMUNITY): Payer: Medicare Other

## 2018-10-23 ENCOUNTER — Other Ambulatory Visit: Payer: Self-pay

## 2018-10-23 ENCOUNTER — Ambulatory Visit (HOSPITAL_COMMUNITY)
Admission: RE | Admit: 2018-10-23 | Discharge: 2018-10-23 | Disposition: A | Discharge status: Home / Self Care | Payer: Medicare Other | Source: Ambulatory Visit | Attending: Cardiovascular Disease | Admitting: Cardiovascular Disease

## 2018-10-23 DIAGNOSIS — I350 Nonrheumatic aortic (valve) stenosis: Secondary | ICD-10-CM | POA: Diagnosis present

## 2018-10-23 MED ORDER — IOHEXOL 350 MG/ML SOLN
100.0000 mL | Freq: Once | INTRAVENOUS | Status: AC | PRN
  Administered 2018-10-23: 100 mL via INTRAVENOUS

## 2018-10-26 ENCOUNTER — Encounter: Payer: Self-pay | Admitting: Physical Therapy

## 2018-10-26 ENCOUNTER — Encounter: Payer: Self-pay | Admitting: Thoracic Surgery (Cardiothoracic Vascular Surgery)

## 2018-10-26 ENCOUNTER — Institutional Professional Consult (permissible substitution): Payer: Medicare Other | Admitting: Thoracic Surgery (Cardiothoracic Vascular Surgery)

## 2018-10-26 ENCOUNTER — Ambulatory Visit: Payer: Medicare Other | Attending: Cardiovascular Disease | Admitting: Physical Therapy

## 2018-10-26 ENCOUNTER — Other Ambulatory Visit: Payer: Self-pay

## 2018-10-26 VITALS — BP 160/90 | HR 83 | Temp 97.9°F | Resp 20 | Ht 70.0 in | Wt 270.0 lb

## 2018-10-26 DIAGNOSIS — I350 Nonrheumatic aortic (valve) stenosis: Secondary | ICD-10-CM | POA: Diagnosis not present

## 2018-10-26 DIAGNOSIS — R2689 Other abnormalities of gait and mobility: Secondary | ICD-10-CM | POA: Insufficient documentation

## 2018-10-26 NOTE — Therapy (Signed)
[place], [place], [address] Phone: [phone]   Fax:  [phone]  Physical Therapy Evaluation  Patient Details  Name: [name] MRN: [id number] Date of Birth: [birth year] Referring Provider (PT): Lauree Chandler MD   Encounter Date: 10/26/2018  PT End of Session - 10/26/18 1417    Visit Number  1    Number of Visits  1    Date for PT Re-Evaluation  10/26/18    PT Start Time  3818    PT Stop Time  1437    PT Time Calculation (min)  32 min    Equipment Utilized During Treatment  Gait belt    Activity Tolerance  Patient tolerated treatment well    Behavior During Therapy  [facility] for tasks assessed/performed       Past Medical History:  Diagnosis Date  . Arthritis    "hands" (02/04/2018)  . Basal cell carcinoma    "MOHS surgery on sides of my nose" (02/04/2018)  . Chronic lower back pain   . CVA (cerebral vascular accident) ([place]) 01/29/2018  . CVA (cerebral vascular accident) ([place]) 02/01/2018  . Diarrhea   . Difficulty urinating   . Heart murmur   . High cholesterol   . Hypertension   . OSA on CPAP    "just got CPAP today" (02/04/2018)  . Rectal bleeding   . Rectal pain   . Stroke [facility])    "we were told that MRI showed previous stroke we were not aware that he had" (02/04/2018)    Past Surgical History:  Procedure Laterality Date  . ANTERIOR CERVICAL DECOMP/DISCECTOMY FUSION  2009  . BACK SURGERY  1980's  . EXCISIONAL HEMORRHOIDECTOMY  1989 -2011 X 2 or 3  . LAPAROSCOPIC CHOLECYSTECTOMY  2005  . LUMBAR DISC SURGERY  1980s X 2  . MOHS SURGERY Bilateral 2006   "sides of my nose"  . RIGHT/LEFT HEART CATH AND CORONARY ANGIOGRAPHY N/A 10/21/2018   Procedure: RIGHT/LEFT HEART CATH AND CORONARY ANGIOGRAPHY;  Surgeon: Burnell Blanks, MD;  Location: [place] CV LAB;  Service: Cardiovascular;  Laterality: N/A;  . TEE WITHOUT CARDIOVERSION N/A 02/03/2018   Procedure: TRANSESOPHAGEAL  ECHOCARDIOGRAM (TEE);  Surgeon: Lelon Perla, MD;  Location: [facility] ENDOSCOPY;  Service: Cardiovascular;  Laterality: N/A;    There were no vitals filed for this visit.   Subjective Assessment - 10/26/18 1412    Subjective  pt is a 73 y.o M with CC of fatigue that occurs when he does strenuous work. pt reports he gets milds SOB and chest pain. he stated that he was assessed last july and found he had a heart murmur and was advised to get an echocardiogram. pt reports through mulitple tests he has gradually worsened over the last year.    Patient Stated Goals  to get heart better         [facility] PT Assessment - 10/26/18 0001      Assessment   Medical Diagnosis  Severe Aortic Stenosis    Referring Provider (PT)  christopher McAlhany MD    Onset Date/Surgical Date  --   1 year   Hand Dominance  Right      Precautions   Precautions  None      Restrictions   Weight Bearing Restrictions  No      Balance Screen   Has the patient fallen in the past 6 months  No      Home Environment   Living  Environment  Private residence    Living Arrangements  Spouse/significant other    Available Help at Discharge  Family    Type of Alpha to enter    Entrance Stairs-Number of Steps  4    Entrance Stairs-Rails  Right   ascending   Home Layout  Two level    Alternate Level Stairs-Number of Steps  13    Alternate Level Stairs-Rails  Right   ascending   Home Equipment  Cane - single point;Crutches      Posture/Postural Control   Posture/Postural Control  Postural limitations    Postural Limitations  Rounded Shoulders;Forward head      ROM / Strength   AROM / PROM / Strength  AROM;Strength      AROM   Overall AROM   Within functional limits for tasks performed      Strength   Overall Strength  Within functional limits for tasks performed    Right Hand Grip (lbs)  36    Left Hand Grip (lbs)  27      Ambulation/Gait   Ambulation/Gait  Yes    Gait Pattern   Decreased stride length;Step-through pattern       [facility] Pre-Surgical Assessment - 10/26/18 0001    5 Meter Walk Test- trial 1  3 sec    5 Meter Walk Test- trial 2  4 sec.     5 Meter Walk Test- trial 3  4 sec.    5 meter walk test average  3.67 sec    4 Stage Balance Test tolerated for:   10 sec.    4 Stage Balance Test Position  4    Sit To Stand Test- trial 1  15 sec.    ADL/IADL Independent with:  Bathing;Dressing;Meal prep;Finances    ADL/IADL Needs Assistance with:  [place] work    ADL/IADL Fraility Index  Midly frail    6 Minute Walk- Baseline  yes    BP (mmHg)  149/78    HR (bpm)  72    02 Sat (%RA)  96 %    Modified Borg Scale for Dyspnea  0- Nothing at all    Perceived Rate of Exertion (Borg)  9- very light    6 Minute Walk Post Test  yes    BP (mmHg)  165/76    HR (bpm)  112    02 Sat (%RA)  91 %    Modified Borg Scale for Dyspnea  3- Moderate shortness of breath or breathing difficulty    Perceived Rate of Exertion (Borg)  13- Somewhat hard    Aerobic Endurance Distance Walked  943    Endurance additional comments  pt is 45.46% limited compared to age related norm              Objective measurements completed on examination: See above findings.                           Plan - 10/26/18 1440    Clinical Impression Statement  see assessment in note    Stability/Clinical Decision Making  Stable/Uncomplicated    Clinical Decision Making  Low    PT Frequency  One time visit    PT Next Visit Plan  Pre-TAVR evaluation       Clinical Impression Statement: Pt is a 73 yo M presenting to OP PT for evaluation prior to possible TAVR surgery due  to severe aortic stenosis. Pt reports onset of generalized fatigue, and SOB approximately 12 months ago. Symptoms are limiting endurance. Pt presents with good ROM and strength, good balance and is assessed as low at the high fall risk 4 stage balance test, good walking speed and limited aerobic endurance  per 6 minute walk test. Pt ambulated 943 feet without requiring a rest break. At the end of testing, patient's HR was 112 bpm and O2 was 93 on room air. Pt reported 3/10 shortness of breath on modified scale for dyspnea. Fatigue and SOB increased significantly with 6 minute walk test. Based on the Short Physical Performance Battery, patient has a frailty rating of 10/12 with </= 5/12 considered frail.    Patient demonstrated  the following deficits and impairments:     Visit Diagnosis: 1. Other abnormalities of gait and mobility        Problem List Patient Active Problem List   Diagnosis Date Noted  . Aortic stenosis, severe   . Chest pain 02/24/2018  . Aortic stenosis 02/24/2018  . Palpitations 02/24/2018  . Hyperlipemia 02/24/2018  . Aphasia   . Observed seizure-like activity ([place]) 02/01/2018  . CVA (cerebral vascular accident) ([place]) 01/29/2018  . Brain tumor ([place]) 01/29/2018  . Essential hypertension 01/29/2018  . BPH (benign prostatic hyperplasia) 01/29/2018  . Anal or rectal pain 05/27/2011   Starr Lake PT, DPT, LAT, ATC  10/26/18  2:43 PM      [facility] [address] [place], [place], [address] Phone: [phone]   Fax:  [phone]  Name: [name] MRN: [id number] Date of Birth: 1945-07-03

## 2018-10-26 NOTE — Patient Instructions (Addendum)
Stop taking Plavix 7 days prior to surgery (tomorrow)  Continue taking all other medications without change through the day before surgery.  Have nothing to eat or drink after midnight the night before surgery.  On the morning of surgery do not take any medications.  You may use your eye drops  For constipation take miralax daily between now and the time of surgery  If no results drink 1 bottle of magnesium citrate

## 2018-10-26 NOTE — Progress Notes (Addendum)
[facility]  MULTIDISCIPLINARY HEART VALVE CLINIC  CARDIOTHORACIC SURGERY CONSULTATION REPORT  Referring Provider is Dorothy Spark, MD PCP is Cyndi Bender, PA-C  Chief Complaint  Patient presents with   Aortic Stenosis    Surgical eval for TAVR, review all studies    HPI:  Patient is 73 year old obese male with history of aortic stenosis, previous stroke, hypertension, hyperlipidemia, obstructive sleep apnea on CPAP, and skin cancer who has been referred for surgical consultation to discuss treatment options for management of severe symptomatic aortic stenosis.  Patient states that he was first noted to have a heart murmur on physical exam performed by nurse during screening evaluation for his insurance in July 2019.  In September 2019 the patient was admitted with an acute stroke.  Symptoms were primarily related to expressive aphasia.  He was diagnosed with a small acute infarct in the left MCA distribution.  He was started on dual antiplatelet therapy and discharged from the hospital but readmitted within 48 hours with new neurologic symptoms and MRI consistent with propagation of previous stroke.  He had a witnessed seizure and Keppra was started.  During his hospitalization a transesophageal echocardiogram was performed to rule out source of embolization.  The patient was noted to have moderate aortic stenosis.  There was no clot in the left atrial appendage.  There was no history of atrial fibrillation.  Bubble study was negative.  Patient was noted to have mild carotid Doppler disease.  He was diagnosed with obstructive sleep apnea and started on CPAP.  The patient has been followed intermittently ever since by Dr. Meda Coffee.  During follow-up telemedicine visit on Sep 17, 2018 the patient reported worsening fatigue and exertional shortness of breath with occasional vague chest pressure.  Transthoracic echocardiogram performed Sep 08, 2018 revealed preserved left  ventricular systolic function with moderate to severe aortic stenosis.  Peak velocity across the aortic valve range between 3.8 and 4 m/s corresponding to mean transvalvular gradient estimated 39 mmHg.  The DVI was reported 0.26 and aortic valve area calculated 1.25 cm.  The patient was referred to the multidisciplinary heart valve clinic and evaluated by Dr. [name] Oct 01, 2018.  Left and right heart catheterization performed October 21, 2018 confirmed the presence of severe aortic stenosis with peak to peak and mean transvalvular gradients measured 50 and 44.2 mmHg respectively.  There was mild nonobstructive coronary artery disease and right heart pressures were normal.  CT angiography was performed and the patient was referred for surgical consultation.  Patient is married and lives locally in [place] with his wife.  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  He lives a sedentary lifestyle.  He admits to gradual progression of decreasing exercise tolerance with progressive exertional fatigue and exertional shortness of breath.  Some of the symptoms predated his stroke last fall.  Symptoms have progressed recently and he has developed mild occasional dizzy spells.  He reports mild tightness across his chest with exertion.  He denies resting shortness of breath, PND, or orthopnea.  He has not had significant lower extremity edema.  He denies any seizures over the past 6 months and he has not had any syncopal episodes.  The patient also reports chronic constipation and recently has had some urinary frequency.  He was seen by his urologist and apparently started on oral ciprofloxacin for prostatitis.  He has nearly completed a 3-week course.  Past Medical History:  Diagnosis Date  Arthritis    "hands" (02/04/2018)   Basal cell carcinoma    "MOHS surgery on sides of my nose" (02/04/2018)   Chronic lower back pain    CVA (cerebral vascular accident)  ([place]) 01/29/2018   CVA (cerebral vascular accident) ([place]) 02/01/2018   Diarrhea    Difficulty urinating    Heart murmur    High cholesterol    Hypertension    OSA on CPAP    "just got CPAP today" (02/04/2018)   Rectal bleeding    Rectal pain    Stroke [facility])    "we were told that MRI showed previous stroke we were not aware that he had" (02/04/2018)    Past Surgical History:  Procedure Laterality Date   ANTERIOR CERVICAL DECOMP/DISCECTOMY FUSION  2009   BACK SURGERY  1980's   EXCISIONAL HEMORRHOIDECTOMY  1989 -2011 X 2 or 3   LAPAROSCOPIC CHOLECYSTECTOMY  2005   LUMBAR [place] X 2   MOHS SURGERY Bilateral 2006   "sides of my nose"   RIGHT/LEFT HEART CATH AND CORONARY ANGIOGRAPHY N/A 10/21/2018   Procedure: RIGHT/LEFT HEART CATH AND CORONARY ANGIOGRAPHY;  Surgeon: Burnell Blanks, MD;  Location: [facility];  Service: Cardiovascular;  Laterality: N/A;   TEE WITHOUT CARDIOVERSION N/A 02/03/2018   Procedure: TRANSESOPHAGEAL ECHOCARDIOGRAM (TEE);  Surgeon: Lelon Perla, MD;  Location: [facility];  Service: Cardiovascular;  Laterality: N/A;    Family History  Problem Relation Age of Onset   Cancer Mother        breast/colon   Heart disease Father    Stroke Neg Hx     Social History   Socioeconomic History   Marital status: Married    Spouse name: Not on file   Number of children: Not on file   Years of education: Not on file   Highest education level: Not on file  Occupational History   Occupation: retired  Scientist, product/process development strain: Not on file   Food insecurity    Worry: Not on file    Inability: Not on Lexicographer needs    Medical: Not on file    Non-medical: Not on file  Tobacco Use   Smoking status: Never Smoker   Smokeless tobacco: Never Used  Substance and Sexual Activity   Alcohol use: Not Currently    Alcohol/week: 0.0 standard drinks   Drug use: Not Currently    Sexual activity: Not Currently  Lifestyle   Physical activity    Days per week: Not on file    Minutes per session: Not on file   Stress: Not on file  Relationships   Social connections    Talks on phone: Not on file    Gets together: Not on file    Attends religious service: Not on file    Active member of club or organization: Not on file    Attends meetings of clubs or organizations: Not on file    Relationship status: Not on file   Intimate partner violence    Fear of current or ex partner: Not on file    Emotionally abused: Not on file    Physically abused: Not on file    Forced sexual activity: Not on file  Other Topics Concern   Not on file  Social History Narrative   Lives with his wife in [place], [place]    Current Outpatient Medications  Medication Sig Dispense Refill   acetaminophen (TYLENOL) 500 MG tablet  Take 1,000 mg by mouth every 6 (six) hours as needed (back pain.).      aspirin EC 81 MG EC tablet Take 1 tablet (81 mg total) by mouth daily. 30 tablet 3   atorvastatin (LIPITOR) 80 MG tablet Take 1 tablet (80 mg total) by mouth daily at 6 PM. 90 tablet 3   ciprofloxacin (CIPRO) 500 MG tablet Take 500 mg by mouth 2 (two) times a day.     clopidogrel (PLAVIX) 75 MG tablet Take 1 tablet (75 mg total) by mouth daily. 90 tablet 3   doxazosin (CARDURA) 4 MG tablet Take 1 tablet (4 mg total) by mouth at bedtime. 90 tablet 3   dutasteride (AVODART) 0.5 MG capsule Take 0.5 mg by mouth daily.  3   HYDROcodone-acetaminophen (NORCO/VICODIN) 5-325 MG tablet Take 1-2 tablets by mouth every 6 (six) hours as needed for moderate pain.     Kaolin POWD Place 1 application rectally as needed (hemorroids (apply after bathing and bowel movements)). PRANICURA- glycerin, kaolin, calamine, menthol cream     levETIRAcetam (KEPPRA) 500 MG tablet Take 250 mg by mouth daily.      Lidocaine, Anorectal, (RECTICARE) 5 % CREA Place 1 application rectally 2 (two) times daily as needed  (hemmorroids.).      losartan (COZAAR) 50 MG tablet Take 1 tablet (50 mg total) by mouth daily. 90 tablet 2   tetrahydrozoline 0.05 % ophthalmic solution Place 1-2 drops into both eyes 3 (three) times daily as needed (dry/irritated eyes.).     No current facility-administered medications for this visit.     No Known Allergies    Review of Systems:   General:  normal appetite, decreased energy, no weight gain, no weight loss, no fever  Cardiac:  occasional chest pain with exertion, no chest pain at rest, + SOB with exertion, no resting SOB, no PND, no orthopnea, no palpitations, no arrhythmia, no atrial fibrillation, no LE edema, + dizzy spells, no syncope  Respiratory:  + exertional shortness of breath, no home oxygen, no productive cough, no dry cough, no bronchitis, no wheezing, no hemoptysis, no asthma, no pain with inspiration or cough, + sleep apnea, + CPAP at night  GI:   no difficulty swallowing, no reflux, no frequent heartburn, no hiatal hernia, no abdominal pain, + chronic constipation, occasional diarrhea, + hematochezia with straining, no hematemesis, no melena  GU:   no dysuria,  + frequency, no urinary tract infection, no hematuria, + enlarged prostate, no kidney stones, no kidney disease  Vascular:  no pain suggestive of claudication, no pain in feet, no leg cramps, no varicose veins, no DVT, no non-healing foot ulcer  Neuro:   + stroke, no TIA's, + seizures, no headaches, no temporary blindness one eye,  no slurred speech, no peripheral neuropathy, no chronic pain, no instability of gait, + memory/cognitive dysfunction  Musculoskeletal: + arthritis, no joint swelling, no myalgias, no difficulty walking, normal mobility   Skin:   no rash, no itching, no skin infections, no pressure sores or ulcerations  Psych:   no anxiety, no depression, no nervousness, no unusual recent stress  Eyes:   no blurry vision, no floaters, no recent vision changes, + wears glasses or  contacts  ENT:   no hearing loss, no loose or painful teeth, no dentures, last saw dentist 2 weeks ago, having a cavity filled tomorrow  Hematologic:  no easy bruising, no abnormal bleeding, no clotting disorder, no frequent epistaxis  Endocrine:  no diabetes, does not check  CBG's at home           Physical Exam:   BP (!) 160/90    Pulse 83    Temp 97.9 F (36.6 C) (Skin)    Resp 20    Ht 5\' 10"  (1.778 m)    Wt 270 lb (122.5 kg)    SpO2 91% Comment: RA   BMI 38.74 kg/m   General:  Moderately obese,  well-appearing  HEENT:  Unremarkable   Neck:   no JVD, no bruits, no adenopathy   Chest:   clear to auscultation, symmetrical breath sounds, no wheezes, no rhonchi   CV:   RRR, grade III/VI crescendo/decrescendo murmur heard best at RSB,  no diastolic murmur  Abdomen:  soft, non-tender, no masses   Extremities:  warm, well-perfused, pulses diminished but palpable, no LE edema  Rectal/GU  Deferred  Neuro:   Grossly non-focal and symmetrical throughout  Skin:   Clean and dry, no rashes, no breakdown   Diagnostic Tests:    ECHOCARDIOGRAM REPORT       Patient Name:   [name]   Date of Exam: 09/08/2018 Medical Rec #:  [id number]     Height:       70.0 in Accession #:    [id number]    Weight:       267.2 lb Date of Birth:  [birth year]     BSA:          2.36 m Patient Age:    31 years      BP:           138/80 mmHg Patient Gender: M             HR:           73 bpm. Exam Location:  [address]    Procedure: 2D Echo, Cardiac Doppler and Color Doppler  Indications:    I35.0 Aortic Stenosis                 I63.00 CVA                 R07.9 Chest Pain   History:        Patient has prior history of Echocardiogram examinations, most                 recent 04/06/2018. Stroke Signs/Symptoms: Murmur Risk Factors:                 Dyslipidemia, Hypertension and Family History of Coronary Artery                 Disease. Obesity, Obstructive Sleep Apnea, Aortic Stenosis                  (prior Mean 37 mmHG).   Sonographer:    Deliah Boston RDCS Referring Phys: Ena Dawley, H  IMPRESSIONS    1. The left ventricle has normal systolic function with an ejection fraction of 60-65%. The cavity size was normal. There is moderately increased left ventricular wall thickness. Left ventricular diastolic Doppler parameters are consistent with impaired  relaxation.  2. The right ventricle has normal systolic function. The cavity was normal.  3. The mitral valve is grossly normal.  4. The tricuspid valve is grossly normal.  5. The aortic valveis bicuspid. Moderate calcification of the aortic valve. Aortic valve regurgitation is trivial by color flow Doppler. Moderate-severe stenosis of the aortic valve.  6. There is mild dilatation of the ascending aorta and of  the aortic root measuring 41 mm.  7. Normal LV function; moderate LVH; moderate diastolic dysfunction; mildly dilated aortic root; functionally bicuspid aortic valve with fusion or left and right cusps; moderate to severe AS (mean gradient 39 mmHg and peak velocity of 4 m/s).  FINDINGS  Left Ventricle: The left ventricle has normal systolic function, with an ejection fraction of 60-65%. The cavity size was normal. There is moderately increased left ventricular wall thickness. Left ventricular diastolic Doppler parameters are consistent  with impaired relaxation.  Right Ventricle: The right ventricle has normal systolic function. The cavity was normal.  Left Atrium: Left atrial size was normal in size.  Right Atrium: Right atrial size was normal in size. Right atrial pressure is estimated at 8 mmHg.  Interatrial Septum: No atrial level shunt detected by color flow Doppler.  Pericardium: There is no evidence of pericardial effusion.  Mitral Valve: The mitral valve is grossly normal. Mitral valve regurgitation is trivial by color flow Doppler.  Tricuspid Valve: The tricuspid valve is grossly normal.  Tricuspid valve regurgitation is trivial by color flow Doppler.  Aortic Valve: The aortic valveis bicuspid Moderate calcification of the aortic valve. Aortic valve regurgitation is trivial by color flow Doppler. There is Moderate-severe stenosis of the aortic valve, with a calculated valve area of 1.25 cm.  Pulmonic Valve: The pulmonic valve was grossly normal. Pulmonic valve regurgitation is trivial by color flow Doppler.  Aorta: There is mild dilatation of the ascending aorta and of the aortic root measuring 41 mm.  Venous: The inferior vena cava is normal in size with greater than 50% respiratory variability.    +--------------+--------++  LEFT VENTRICLE            +----------------+---------++ +--------------+--------++  Diastology                    PLAX 2D                   +----------------+---------++ +--------------+--------++  LV e' lateral:   4.13 cm/s    LVIDd:         4.06 cm    +----------------+---------++ +--------------+--------++  LV E/e' lateral: 19.0         LVIDs:         2.55 cm    +----------------+---------++ +--------------+--------++  LV e' medial:    5.98 cm/s    LV PW:         1.76 cm    +----------------+---------++ +--------------+--------++  LV E/e' medial:  13.1         LV IVS:        1.50 cm    +----------------+---------++ +--------------+--------++  LVOT diam:     2.50 cm    +--------------+--------++  LV SV:         49 ml      +--------------+--------++  LV SV Index:   19.65      +--------------+--------++  LVOT Area:     4.91 cm   +--------------+--------++                            +--------------+--------++  +---------------+----------++  RIGHT VENTRICLE              +---------------+----------++  RV S prime:     15.90 cm/s   +---------------+----------++  TAPSE (M-mode): 2.4 cm       +---------------+----------++  +---------------+-------++-----------++  LEFT ATRIUM  Index          +---------------+-------++-----------++  LA diam:        5.00 cm  2.12 cm/m    +---------------+-------++-----------++  LA Vol (A2C):   57.7 ml  24.44 ml/m   +---------------+-------++-----------++  LA Vol (A4C):   64.2 ml  27.19 ml/m   +---------------+-------++-----------++  LA Biplane Vol: 61.2 ml  25.92 ml/m   +---------------+-------++-----------++ +------------+---------++-----------++  RIGHT ATRIUM            Index         +------------+---------++-----------++  RA Area:     14.60 cm                +------------+---------++-----------++  RA Volume:   33.50 ml   14.19 ml/m   +------------+---------++-----------++  +------------------+------------++  AORTIC VALVE                      +------------------+------------++  AV Area (Vmax):    1.39 cm       +------------------+------------++  AV Area (Vmean):   1.31 cm       +------------------+------------++  AV Area (VTI):     1.25 cm       +------------------+------------++  AV Vmax:           384.60 cm/s    +------------------+------------++  AV Vmean:          245.800 cm/s   +------------------+------------++  AV VTI:            0.945 m        +------------------+------------++  AV Peak Grad:      59.2 mmHg      +------------------+------------++  AV Mean Grad:      39.0 mmHg      +------------------+------------++  LVOT Vmax:         109.00 cm/s    +------------------+------------++  LVOT Vmean:        65.800 cm/s    +------------------+------------++  LVOT VTI:          0.241 m        +------------------+------------++  LVOT/AV VTI ratio: 0.26           +------------------+------------++   +-------------+-------++  AORTA                   +-------------+-------++  Ao Root diam: 4.10 cm   +-------------+-------++  Ao Asc diam:  3.80 cm   +-------------+-------++  +--------------+--------++  MITRAL VALVE                +--------------+-------+ +--------------+--------++    SHUNTS                    MV Area (PHT): cm          +--------------+-------+ +--------------+--------++    Systemic VTI:  0.24 m    MV PHT:        msec         +--------------+-------+ +--------------+--------++    Systemic Diam: 2.50 cm   MV Decel Time: 263 msec     +--------------+-------+ +--------------+--------++ +--------------+----------++  MV E velocity: 78.55 cm/s   +--------------+----------++  MV A velocity: 99.75 cm/s   +--------------+----------++  MV E/A ratio:  0.79         +--------------+----------++    Kirk Ruths MD Electronically signed by Kirk Ruths MD Signature Date/Time: 09/08/2018/3:29:12 PM     RIGHT/LEFT HEART CATH AND CORONARY ANGIOGRAPHY  Conclusion    Mid RCA lesion is 40% stenosed.  Ost Cx lesion is 20% stenosed.  Mid Cx lesion is 20% stenosed.  Dist LAD lesion is 20% stenosed.   1. Mild non-obstructive CAD 2. Severe aortic stenosis (mean gradient 44.2 mmHg, peak to peak gradient 50 mmHg)  Recommendations: Will continue workup for TAVR.    Recommendations  Antiplatelet/Anticoag Continue workup for TAVR.  Indications  Severe aortic stenosis [I35.0 (ICD-10-CM)]  Procedural Details  Technical Details Indication: 72 yo male with severe AS.   Procedure: The risks, benefits, complications, treatment options, and expected outcomes were discussed with the patient. The patient and/or family concurred with the proposed plan, giving informed consent. The patient was brought to the cath lab after IV hydration was given. The patient was sedated with Versed and Fentanyl. The IV catheter present in the right antecubital vein was changed for a 5 [place] sheath. Right heart cath performed with a balloon tipped catheter. The right wrist was prepped and draped in a sterile fashion. 1% lidocaine was used for local anesthesia. Using the modified Seldinger access technique, a 5 French sheath was placed in the right radial artery. 3 mg Verapamil was given through the  sheath. 5000 units IV heparin was given. Standard diagnostic catheters were used to perform selective coronary angiography. I crossed the aortic valve with an AL-1 catheter and a straight wire. The sheath was removed from the right radial artery and a Terumo hemostasis band was applied at the arteriotomy site on the right wrist.   Estimated blood loss <50 mL.   During this procedure medications were administered to achieve and maintain moderate conscious sedation while the patient's heart rate, blood pressure, and oxygen saturation were continuously monitored and I was present face-to-face 100% of this time.  Medications (Filter: Administrations occurring from 10/21/18 0945 to 10/21/18 1045) Medication Rate/Dose/Volume Action  Date Time   Heparin (Porcine) in NaCl 1000-0.9 UT/500ML-% SOLN (mL) 500 mL Given 10/21/18 0958   Total dose as of 10/21/18 1045 500 mL Given 0958   1,000 mL        fentaNYL (SUBLIMAZE) injection (mcg) 25 mcg Given 10/21/18 1005   Total dose as of 10/21/18 1045        25 mcg        midazolam (VERSED) injection (mg) 1 mg Given 10/21/18 1005   Total dose as of 10/21/18 1045        1 mg        lidocaine (PF) (XYLOCAINE) 1 % injection (mL) 2 mL Given 10/21/18 1008   Total dose as of 10/21/18 1045 2 mL Given 1015   4 mL        Radial Cocktail/Verapamil only (mL) 10 mL Given 10/21/18 1017   Total dose as of 10/21/18 1045        10 mL        heparin injection (Units) 5,000 Units Given 10/21/18 1020   Total dose as of 10/21/18 1045        5,000 Units        iohexol (OMNIPAQUE) 350 MG/ML injection (mL) 65 mL Given 10/21/18 1042   Total dose as of 10/21/18 1045        65 mL        Sedation Time  Sedation Time Physician-1: 31 minutes 18 seconds  Complications  Complications documented before study signed (10/21/2018 10:56 AM)   RIGHT/LEFT HEART CATH AND CORONARY ANGIOGRAPHY  None Documented by Burnell Blanks, MD 10/21/2018 10:49 AM  Date Found: 10/21/2018   Time Range: Theadora Rama  Coronary Findings  Diagnostic Dominance: Right Left Anterior Descending  Vessel is large.  Dist LAD lesion 20% stenosed  Dist LAD lesion is 20% stenosed.  Left Circumflex  Vessel is large.  Ost Cx lesion 20% stenosed  Ost Cx lesion is 20% stenosed.  Mid Cx lesion 20% stenosed  Mid Cx lesion is 20% stenosed.  Right Coronary Artery  Mid RCA lesion 40% stenosed  Mid RCA lesion is 40% stenosed.  Intervention  No interventions have been documented. Coronary Diagrams  Diagnostic Dominance: Right  Intervention  Implants   No implant documentation for this case.  Syngo Images  Show images for CARDIAC CATHETERIZATION  Images on Long Term Storage  Show images for Ermias, Tomeo to Procedure Log  Procedure Log    Hemo Data   Most Recent Value  Fick Cardiac Output 7.74 L/min  Fick Cardiac Output Index 3.26 (L/min)/BSA  Aortic Mean Gradient 44.21 mmHg  Aortic Peak Gradient 50 mmHg  Aortic Valve Area 1.26  Aortic Value Area Index 0.53 cm2/BSA  RA A Wave 15 mmHg  RA V Wave 11 mmHg  RA Mean 9 mmHg  RV Systolic Pressure 42 mmHg  RV Diastolic Pressure 2 mmHg  RV EDP 7 mmHg  PA Systolic Pressure 43 mmHg  PA Diastolic Pressure 14 mmHg  PA Mean 26 mmHg  AO Systolic Pressure 161 mmHg  AO Diastolic Pressure 67 mmHg  AO Mean 90 mmHg  LV Systolic Pressure 096 mmHg  LV Diastolic Pressure 7 mmHg  LV EDP 12 mmHg  AOp Systolic Pressure 045 mmHg  AOp Diastolic Pressure 64 mmHg  AOp Mean Pressure 89 mmHg  LVp Systolic Pressure 409 mmHg  LVp Diastolic Pressure 6 mmHg  LVp EDP Pressure 16 mmHg  QP/QS 1  TPVR Index 7.97 HRUI  TSVR Index 27.63 HRUI  TPVR/TSVR Ratio 0.29     Cardiac TAVR CT  TECHNIQUE: The patient was scanned on a Siemens Force 811 slice scanner. A 120 kV retrospective scan was triggered in the descending thoracic aorta at 111 HU's. Gantry rotation speed was 270 msecs and collimation was .9 mm. No beta  blockade or nitro were given. The 3D data set was reconstructed in 5% intervals of the R-R cycle. Systolic and diastolic phases were analyzed on a dedicated work station using MPR, MIP and VRT modes. The patient received 136mL OMNIPAQUE IOHEXOL 350 MG/ML SOLN of contrast.  FINDINGS: Aortic Valve: Trileaflet aortic valve. Severe leaflet calcifications, moderately leaflet thickening, severely reduced leaflet excursion  AV calcium score: Noncontrast images not performed, unable to calculate AV calcium score.  Aorta: Conventional 3 vessel branch pattern of aortic arch. Trivial mixed atherosclerotic plaque in the aortic arch. No significant plaque or calcifications in the ascending or descending thoracic aorta.  Sinus of Valsalva Measurements:  Non-coronary: 31 mm  Right - coronary: 31 mm  Left - coronary: 31 mm  Sinotubular Junction: 30 mm  Mid ascending Thoracic Aorta (level of PA bifurcation): 37 mm  Aortic Arch: 30 mm  Mid descending Thoracic Aorta (at level of pulmonary veins): 26 mm  Sinus of Valsalva Height:  Left: 17 mm  Right: 16 mm  Coronary Artery Height above Annulus:  Left Main: 11 mm  Right Coronary: 11 mm  Virtual Basal Annulus Measurements:  Maximum/Minimum Diameter: 26.4 mm x 24.8 mm  Perimeter: 79.5 mm  Area: 490 mm2  Based on these measurements, the annulus may be best suited for a 26 mm Sapien 3 valve  Coronary Arteries: Normal  origin and course. Calcifications in the proximal LAD and proximal circumflex.  Optimum Fluoroscopic Angle for Delivery: LAO 15, CAU 10  IMPRESSION: 1. Trileaflet aortic valve. Severe leaflet calcifications, moderately leaflet thickening, severely reduced leaflet excursion  2. Normal caliber thoracic aorta with trivial aortic arch calcifications.  3.  Adequate coronary heights, LM 11 mm, RCA 11 mm  4. Annulus area 490 mm2. Based on these measurements, the annulus may be best  suited for a 26 mm Sapien 3 valve.  5.  Optimum Fluoroscopic Angle for Delivery: LAO 15, CAU 10   Electronically Signed   By: Cherlynn Kaiser   On: 10/23/2018 15:57     CT ANGIOGRAPHY CHEST, ABDOMEN AND PELVIS  TECHNIQUE: Multidetector CT imaging through the chest, abdomen and pelvis was performed using the standard protocol during bolus administration of intravenous contrast. Multiplanar reconstructed images and MIPs were obtained and reviewed to evaluate the vascular anatomy.  CONTRAST:  178mL OMNIPAQUE IOHEXOL 350 MG/ML SOLN  COMPARISON:  05/23/2009 chest radiograph.  FINDINGS: CTA CHEST FINDINGS  Cardiovascular: Mild cardiomegaly. No significant pericardial effusion/thickening. Left anterior descending coronary atherosclerosis. Marked thickening and calcification of the aortic valve. Atherosclerotic nonaneurysmal thoracic aorta. Dilated main pulmonary artery (3.5 cm diameter). No central pulmonary emboli.  Mediastinum/Nodes: No discrete thyroid nodules. Unremarkable esophagus. No pathologically enlarged axillary, mediastinal or hilar lymph nodes.  Lungs/Pleura: No pneumothorax. No pleural effusion. Lingular solid 4 mm pulmonary nodule (series 15/image 47). No acute consolidative airspace disease, lung masses or additional significant pulmonary nodules. Mosaic attenuation throughout both lungs.  Musculoskeletal: No aggressive appearing focal osseous lesions. Moderate thoracic spondylosis. Relatively symmetric moderate bilateral gynecomastia.  CTA ABDOMEN AND PELVIS FINDINGS  Hepatobiliary: Normal liver size. Subcentimeter hypodense left liver dome lesion is too small to characterize and requires no follow-up unless the patient has risk factors for liver malignancy. No additional liver lesions. Cholecystectomy. No biliary ductal dilatation.  Pancreas: Normal, with no mass or duct dilation.  Spleen: Normal size. No mass.  Adrenals/Urinary  Tract: Normal adrenals. Normal kidneys with no hydronephrosis and no renal mass. Normal bladder.  Stomach/Bowel: Normal non-distended stomach. Normal caliber small bowel with no small bowel wall thickening. Normal appendix. Normal large bowel with no diverticulosis, large bowel wall thickening or pericolonic fat stranding.  Vascular/Lymphatic: Atherosclerotic nonaneurysmal abdominal aorta. Patent splenic and renal veins. No pathologically enlarged lymph nodes in the abdomen or pelvis.  Reproductive: Mildly enlarged prostate.  Other: No pneumoperitoneum, ascites or focal fluid collection.  Musculoskeletal: No aggressive appearing focal osseous lesions. Marked lumbar spondylosis.  VASCULAR MEASUREMENTS PERTINENT TO TAVR:  AORTA:  Minimal Aortic Diameter-17.4 x 17.4 mm  Severity of Aortic Calcification-mild  RIGHT PELVIS:  Right Common Iliac Artery -  Minimal Diameter-10.1 x 9.7 mm  Tortuosity-mild  Calcification-minimal  Right External Iliac Artery -  Minimal Diameter-8.1 x 7.7 mm  Tortuosity-mild to moderate  Calcification-none  Right Common Femoral Artery -  Minimal Diameter-9.2 x 8.1 mm  Tortuosity-mild  Calcification-mild  LEFT PELVIS:  Left Common Iliac Artery -  Minimal Diameter-10.1 x 9.7 mm  Tortuosity-mild  Calcification-none  Left External Iliac Artery -  Minimal Diameter-8.8 x 8.0 mm  Tortuosity-mild-to-moderate  Calcification-none  Left Common Femoral Artery -  Minimal Diameter-9.2 x 9.0 mm  Tortuosity-mild  Calcification-none  Review of the MIP images confirms the above findings.  IMPRESSION: 1. Vascular findings and measurements pertinent to potential TAVR procedure, as detailed. 2. Marked thickening and calcification of the aortic valve, compatible with the reported history of severe symptomatic aortic stenosis.  3. Aortic Atherosclerosis (ICD10-I70.0). 4. Mild cardiomegaly. One  vessel coronary atherosclerosis. 5. Dilated main pulmonary artery, suggesting pulmonary arterial hypertension. 6. Solid 4 mm pulmonary nodule in the lingula. No follow-up needed if patient is low-risk. Non-contrast chest CT can be considered in 12 months if patient is high-risk. This recommendation follows the consensus statement: Guidelines for Management of Incidental Pulmonary Nodules Detected on CT Images:From the Fleischner Society 2017; published online before print (10.1148/radiol.6226333545). 7. Mildly enlarged prostate.   Electronically Signed   By: Ilona Sorrel M.D.   On: 10/23/2018 11:58    EKG: NSR w/out significant AV conduction delay     Impression:  Patient has stage D severe symptomatic aortic stenosis.  He presents with progressive symptoms of exertional shortness of breath and fatigue consistent with chronic diastolic congestive heart failure, NYHA functional class II.  I have personally reviewed the patient's recent transthoracic echocardiogram, diagnostic cardiac catheterization, and CT angiograms.  Echocardiogram reveals severe thickening and calcification involving 2 out of 3 of the patient's aortic valve leaflets.  These 2 leaflets are essentially immobile and almost appears though they may be fused.  The third leaflet is less involved and more mobile.  Peak velocity across the aortic valve range between 3.8 of 4.0 m/s corresponding to mean transvalvular gradient estimated 39 mmHg.  Left ventricular systolic function remains preserved.  Diagnostic cardiac catheterization confirmed the presence of severe aortic stenosis with mean transvalvular gradient measured 44 mmHg.  The patient has mild nonobstructive coronary artery disease.  I agree the patient would likely benefit from aortic valve replacement.  Cardiac-gated CTA of the heart reveals anatomical characteristics consistent with aortic stenosis suitable for treatment by transcatheter aortic valve replacement  without any significant complicating features and CTA of the aorta and iliac vessels demonstrate what appears to be adequate pelvic vascular access to facilitate a transfemoral approach.  Baseline twelve-lead EKG reveals sinus rhythm with no significant AV conduction delay.    Plan:  The patient was counseled at length regarding treatment alternatives for management of severe symptomatic aortic stenosis. Alternative approaches such as conventional aortic valve replacement, transcatheter aortic valve replacement, and continued medical therapy without intervention were compared and contrasted at length.  The risks associated with conventional surgical aortic valve replacement were discussed in detail, as were expectations for post-operative convalescence.  Issues specific to transcatheter aortic valve replacement were discussed including questions about long term valve durability, the potential for paravalvular leak, possible increased risk of need for permanent pacemaker placement, and other technical complications related to the procedure itself.  Long-term prognosis with medical therapy was discussed. This discussion was placed in the context of the patient's own specific clinical presentation and past medical history.  All of his questions have been addressed.  The patient desires to proceed with transcatheter aortic valve replacement as soon as practical.  We tentatively plan to proceed with surgery on November 03, 2018.  Following the decision to proceed with transcatheter aortic valve replacement, a discussion has been held regarding what types of management strategies would be attempted intraoperatively in the event of life-threatening complications, including whether or not the patient would be considered a candidate for the use of cardiopulmonary bypass and/or conversion to open sternotomy for attempted surgical intervention.  The patient has been advised of a variety of complications that might develop  including but not limited to risks of death, stroke, paravalvular leak, aortic dissection or other major vascular complications, aortic annulus rupture, device embolization, cardiac rupture or perforation, mitral regurgitation,  acute myocardial infarction, arrhythmia, heart block or bradycardia requiring permanent pacemaker placement, congestive heart failure, respiratory failure, renal failure, pneumonia, infection, other late complications related to structural valve deterioration or migration, or other complications that might ultimately cause a temporary or permanent loss of functional independence or other long term morbidity.  The patient provides full informed consent for the procedure as described and all questions were answered.    I spent in excess of 90 minutes during the conduct of this office consultation and >50% of this time involved direct face-to-face encounter with the patient for counseling and/or coordination of their care.    Valentina Gu. Roxy Manns, MD 10/26/2018 4:13 PM

## 2018-10-27 ENCOUNTER — Other Ambulatory Visit: Payer: Self-pay

## 2018-10-27 DIAGNOSIS — I350 Nonrheumatic aortic (valve) stenosis: Secondary | ICD-10-CM

## 2018-10-29 NOTE — Progress Notes (Addendum)
[id number] - [place], [place] [address] Phone: [phone] Fax: [phone]      Your procedure is scheduled on Tuesday, 6/30.  Report to [facility] Main Entrance "A" at 10 A.M., and check in at the Admitting office.  Call this number if you have problems the morning of surgery:  [phone]  Call [phone] if you have any questions prior to your surgery date Monday-Friday 8am-4pm    Remember:  Do not eat or drink after midnight Monday   Take these medicines the morning of surgery with A SIP OF WATER: None but tetrahydrozoline 0.05 % ophthalmic solution if needed.  Verified with Lauren in Office via phone.   7 days prior to surgery STOP taking any Aspirin (unless otherwise instructed by your surgeon), Aleve, Naproxen, Ibuprofen, Motrin, Advil, Goody's, BC's, all herbal medications, fish oil, and all vitamins.    The Morning of Surgery  Do not wear jewelry, make-up or nail polish.  Do not wear lotions, powders, or perfumes/colognes, or deodorant  Do not shave 48 hours prior to surgery.  Men may shave face &  neck.  Do not bring valuables to the hospital.  [facility] is not responsible for any belongings or valuables.  If you are a smoker, DO NOT Smoke 24 hours prior to surgery  IF you wear a CPAP at night please bring your mask, tubing, and machine the morning of surgery   Remember that you must have someone to transport you home after your surgery, and remain with you for 24 hours if you are discharged the same day.  Patients discharged the day of surgery will not be allowed to drive home.   Contacts, glasses, hearing aids, dentures or bridgework may not be worn into surgery.   For patients admitted to the hospital, discharge time will be determined by your treatment team.  Patients discharged the day of surgery will not be allowed to drive home.    Special instructions:   [facility]- Preparing For Surgery  Before surgery, you can play an important role. Because skin is not sterile, your skin needs to be as free of germs as possible. You can reduce the number of germs on your skin by washing with CHG (chlorahexidine gluconate) Soap before surgery.  CHG is an antiseptic cleaner which kills germs and bonds with the skin to continue killing germs even after washing.    Oral Hygiene is also important to reduce your risk of infection.  Remember - BRUSH YOUR TEETH THE MORNING OF SURGERY WITH YOUR REGULAR TOOTHPASTE  Please do not use if you have an allergy to CHG or antibacterial soaps. If your skin becomes reddened/irritated stop using the CHG.  Do not shave (including legs and underarms) for at least 48 hours prior to first CHG shower. It is OK to shave your face.  Please follow these instructions carefully.   1. Shower the [employer] BEFORE SURGERY (Mon) and the MORNING OF SURGERY (Tues) with CHG Soap.   2. If you chose to wash your hair, wash your hair first as usual with your normal shampoo.  3. After you shampoo, rinse your hair and body thoroughly to remove the shampoo.  4. Use CHG as you would any other liquid soap. You can apply CHG directly to the skin and wash gently with a scrungie or a clean washcloth.   5. Apply the CHG Soap to your body ONLY FROM THE NECK  DOWN.  Do not use on open wounds or open sores. Avoid contact with your eyes, ears, mouth and genitals (private parts). Wash Face and genitals (private parts)  with your normal soap.   6. Wash thoroughly, paying special attention to the area where your surgery will be performed.  7. Thoroughly rinse your body with warm water from the neck down.  8. DO NOT shower/wash with your normal soap after using and rinsing off the CHG Soap.  9. Pat yourself dry with a CLEAN TOWEL.  10. Wear CLEAN PAJAMAS to bed the night before surgery, wear comfortable clothes the morning of surgery  11. Place CLEAN SHEETS on  your bed the night of your first shower and DO NOT SLEEP WITH PETS.    Day of Surgery:  Do not apply any deodorants/lotions.  Please wear clean clothes to the hospital/surgery center.   Remember to brush your teeth WITH YOUR REGULAR TOOTHPASTE.   Please read over the following fact sheets that you were given.

## 2018-10-29 NOTE — Progress Notes (Signed)
Patient informed of the [place] that is currently in effect.  Patient verbalized understanding.  Patient denies shortness of breath, fever, cough and chest pain at PAT appointment.  PCP - Cyndi Bender, [email or web address]  Healt Cardiologist - Dr Lauree Chandler  Chest x-Gapinski - 10/30/18 EKG - 10/30/18 Stress Test - Denies ECHO - 2020 Cardiac Cath - 10/21/18  Sleep Study -  yes CPAP -  Uses CPAP nightly  Blood Thinner Instructions:  Plavix - last does on 10/27/18 per MD.  Verified plavix instructions with Lauren in Office.  Aspirin Instructions:  Last dose on 11/02/18 per MD.  Verified ASA instructions with Lauren in Office.  Anesthesia review: Yes  Coronavirus Screening Have you or your wife experienced the following symptoms:  Cough yes/no: No Fever (>100.76F)  yes/no: No Runny nose yes/no: No Sore throat yes/no: No Difficulty breathing/shortness of breath  yes/no: No  Have you or a family member traveled in the last 14 days and where? yes/no: No

## 2018-10-30 ENCOUNTER — Other Ambulatory Visit: Payer: Self-pay

## 2018-10-30 ENCOUNTER — Encounter (HOSPITAL_COMMUNITY)
Admission: RE | Admit: 2018-10-30 | Discharge: 2018-10-30 | Disposition: A | Discharge status: Home / Self Care | Payer: Medicare Other | Source: Ambulatory Visit | Attending: Cardiovascular Disease | Admitting: Cardiovascular Disease

## 2018-10-30 ENCOUNTER — Encounter (HOSPITAL_COMMUNITY): Payer: Self-pay

## 2018-10-30 ENCOUNTER — Other Ambulatory Visit (HOSPITAL_COMMUNITY)
Admission: RE | Admit: 2018-10-30 | Discharge: 2018-10-30 | Disposition: A | Discharge status: Home / Self Care | Payer: Medicare Other | Source: Ambulatory Visit | Attending: Cardiovascular Disease | Admitting: Cardiovascular Disease

## 2018-10-30 DIAGNOSIS — I350 Nonrheumatic aortic (valve) stenosis: Secondary | ICD-10-CM | POA: Insufficient documentation

## 2018-10-30 DIAGNOSIS — Z01818 Encounter for other preprocedural examination: Secondary | ICD-10-CM | POA: Insufficient documentation

## 2018-10-30 DIAGNOSIS — Z1159 Encounter for screening for other viral diseases: Secondary | ICD-10-CM | POA: Diagnosis not present

## 2018-10-30 LAB — URINALYSIS, ROUTINE W REFLEX MICROSCOPIC
Bacteria, UA: NONE SEEN
Bilirubin Urine: NEGATIVE
Glucose, UA: >=500 mg/dL — AB
Hgb urine dipstick: NEGATIVE
Ketones, ur: NEGATIVE mg/dL
Leukocytes,Ua: NEGATIVE
Nitrite: NEGATIVE
Protein, ur: NEGATIVE mg/dL
Specific Gravity, Urine: 1.013 (ref 1.005–1.030)
pH: 6 (ref 5.0–8.0)

## 2018-10-30 LAB — TYPE AND SCREEN
ABO/RH(D): A POS
Antibody Screen: NEGATIVE

## 2018-10-30 LAB — CBC
HCT: 44.2 % (ref 39.0–52.0)
Hemoglobin: 14.5 g/dL (ref 13.0–17.0)
MCH: 30.9 pg (ref 26.0–34.0)
MCHC: 32.8 g/dL (ref 30.0–36.0)
MCV: 94.2 fL (ref 80.0–100.0)
Platelets: 193 10*3/uL (ref 150–400)
RBC: 4.69 MIL/uL (ref 4.22–5.81)
RDW: 12.3 % (ref 11.5–15.5)
WBC: 7.9 10*3/uL (ref 4.0–10.5)
nRBC: 0 % (ref 0.0–0.2)

## 2018-10-30 LAB — COMPREHENSIVE METABOLIC PANEL
ALT: 24 U/L (ref 0–44)
AST: 22 U/L (ref 15–41)
Albumin: 3.4 g/dL — ABNORMAL LOW (ref 3.5–5.0)
Alkaline Phosphatase: 69 U/L (ref 38–126)
Anion gap: 9 (ref 5–15)
BUN: 9 mg/dL (ref 8–23)
CO2: 23 mmol/L (ref 22–32)
Calcium: 8.6 mg/dL — ABNORMAL LOW (ref 8.9–10.3)
Chloride: 106 mmol/L (ref 98–111)
Creatinine, Ser: 0.94 mg/dL (ref 0.61–1.24)
GFR calc Af Amer: >60 mL/min (ref >60)
GFR calc non Af Amer: >60 mL/min (ref >60)
Glucose, Bld: 133 mg/dL — ABNORMAL HIGH (ref 70–99)
Potassium: 3.9 mmol/L (ref 3.5–5.1)
Sodium: 138 mmol/L (ref 135–145)
Total Bilirubin: 0.5 mg/dL (ref 0.3–1.2)
Total Protein: 6.3 g/dL — ABNORMAL LOW (ref 6.5–8.1)

## 2018-10-30 LAB — BLOOD GAS, ARTERIAL
Acid-Base Excess: 1.7 mmol/L (ref 0.0–2.0)
Bicarbonate: 26.3 mmol/L (ref 20.0–28.0)
Drawn by: 421801
FIO2: 21
O2 Saturation: 97.9 %
Patient temperature: 98.6
pCO2 arterial: 45.2 mmHg (ref 32.0–48.0)
pH, Arterial: 7.383 (ref 7.350–7.450)
pO2, Arterial: 98.2 mmHg (ref 83.0–108.0)

## 2018-10-30 LAB — HEMOGLOBIN A1C
Hgb A1c MFr Bld: 6.7 % — ABNORMAL HIGH (ref 4.8–5.6)
Mean Plasma Glucose: 145.59 mg/dL

## 2018-10-30 LAB — ABO/RH: ABO/RH(D): A POS

## 2018-10-30 LAB — SARS CORONAVIRUS 2 (TAT 6-24 HRS): SARS Coronavirus 2: NEGATIVE

## 2018-10-30 LAB — APTT: aPTT: 34 seconds (ref 24–36)

## 2018-10-30 LAB — PROTIME-INR
INR: 1.1 (ref 0.8–1.2)
Prothrombin Time: 13.7 seconds (ref 11.4–15.2)

## 2018-10-30 LAB — SURGICAL PCR SCREEN
MRSA, PCR: NEGATIVE
Staphylococcus aureus: NEGATIVE

## 2018-10-30 LAB — BRAIN NATRIURETIC PEPTIDE: B Natriuretic Peptide: 36 pg/mL (ref 0.0–100.0)

## 2018-11-02 MED ORDER — VANCOMYCIN HCL 10 G IV SOLR
1250.0000 mg | INTRAVENOUS | Status: DC
  Filled 2018-11-02 (×2): qty 1250

## 2018-11-02 MED ORDER — DEXMEDETOMIDINE HCL IN NACL 400 MCG/100ML IV SOLN
0.1000 ug/kg/h | INTRAVENOUS | Status: AC
  Administered 2018-11-03: 14:00:00 1 ug/kg/h via INTRAVENOUS
  Filled 2018-11-02: qty 100

## 2018-11-02 MED ORDER — MAGNESIUM SULFATE 50 % IJ SOLN
40.0000 meq | INTRAMUSCULAR | Status: DC
  Filled 2018-11-02: qty 9.85

## 2018-11-02 MED ORDER — POTASSIUM CHLORIDE 2 MEQ/ML IV SOLN
80.0000 meq | INTRAVENOUS | Status: DC
  Filled 2018-11-02: qty 40

## 2018-11-02 MED ORDER — NOREPINEPHRINE 4 MG/250ML-% IV SOLN
0.0000 ug/min | INTRAVENOUS | Status: DC
  Filled 2018-11-02: qty 250

## 2018-11-02 MED ORDER — VANCOMYCIN HCL 10 G IV SOLR
1500.0000 mg | INTRAVENOUS | Status: AC
  Administered 2018-11-03: 1500 mg via INTRAVENOUS
  Filled 2018-11-02 (×2): qty 1500

## 2018-11-02 MED ORDER — SODIUM CHLORIDE 0.9 % IV SOLN
INTRAVENOUS | Status: DC
  Filled 2018-11-02 (×2): qty 30

## 2018-11-02 MED ORDER — SODIUM CHLORIDE 0.9 % IV SOLN
1.5000 g | INTRAVENOUS | Status: AC
  Administered 2018-11-03: 1.5 g via INTRAVENOUS
  Filled 2018-11-02 (×2): qty 1.5

## 2018-11-02 NOTE — Anesthesia Preprocedure Evaluation (Addendum)
Anesthesia Evaluation  Patient identified by MRN, date of birth, ID band Patient awake    Reviewed: Allergy & Precautions, NPO status , Patient's Chart, lab work & pertinent test results  Airway Mallampati: III  TM Distance: >3 FB Neck ROM: Full    Dental  (+) Chipped,    Pulmonary sleep apnea and Continuous Positive Airway Pressure Ventilation ,    Pulmonary exam normal breath sounds clear to auscultation       Cardiovascular hypertension, Pt. on medications + Valvular Problems/Murmurs AS  Rhythm:Regular Rate:Normal + Systolic murmurs ECG: SB, rate 56   CATH: Mid RCA lesion is 40% stenosed. Ost Cx lesion is 20% stenosed. Mid Cx lesion is 20% stenosed. Dist LAD lesion is 20% stenosed.  1. Mild non-obstructive CAD 2. Severe aortic stenosis (mean gradient 44.2 mmHg, peak to peak gradient  50 mmHg)  ECHO: 1. The left ventricle has normal systolic function with an ejection fraction of 60-65%. The cavity size was normal. There is moderately increased left ventricular wall thickness. Left ventricular diastolic Doppler parameters are consistent with impaired relaxation. 2. The right ventricle has normal systolic function. The cavity was normal. 3. The mitral valve is grossly normal. 4. The tricuspid valve is grossly normal. 5. The aortic valveis bicuspid. Moderate calcification of the aortic valve. Aortic valve regurgitation is trivial by color flow Doppler. Moderate-severe stenosis of the aortic valve. 6. There is mild dilatation of the ascending aorta and of the aortic root measuring 41 mm. 7. Normal LV function; moderate LVH; moderate diastolic dysfunction; mildly dilated aortic root; functionally bicuspid aortic valve with fusion or left and right cusps; moderate to severe AS (mean gradient 39 mmHg and peak velocity of 4 m/s).   Neuro/Psych CVA (Speech), Residual Symptoms negative psych ROS   GI/Hepatic negative GI ROS, Neg  liver ROS,   Endo/Other  negative endocrine ROS  Renal/GU negative Renal ROS     Musculoskeletal negative musculoskeletal ROS (+)   Abdominal (+) + obese,   Peds  Hematology HLD   Anesthesia Other Findings Severe Aortic Stenosis  Reproductive/Obstetrics                           Anesthesia Physical Anesthesia Plan  ASA: IV  Anesthesia Plan: MAC   Post-op Pain Management:    Induction: Intravenous  PONV Risk Score and Plan: 1 and Treatment may vary due to age or medical condition, Ondansetron and Dexamethasone  Airway Management Planned: Simple Face Mask  Additional Equipment: Arterial line  Intra-op Plan:   Post-operative Plan:   Informed Consent: I have reviewed the patients History and Physical, chart, labs and discussed the procedure including the risks, benefits and alternatives for the proposed anesthesia with the patient or authorized representative who has indicated his/her understanding and acceptance.     Dental advisory given  Plan Discussed with: CRNA  Anesthesia Plan Comments: (Admitted to the hospital 01/30/2018 with small acute left MCA infarct highly suspicious for atrial fibrillation.  Readmitted 02/01/2018 with new neuro changes, MRI with propagation of previous CVA, witnessed seizure, Keppra started. TEE showed moderate aortic stenosis with negative bubble study, aortic valve area 1.1 cm, mild AI, normal LV function.  Mild carotid disease by Dopplers.  Followup TTE 09/08/18 revealed preserved left ventricular systolic function with moderate to severe aortic stenosis.  Peak velocity across the aortic valve range between 3.8 and 4 m/s corresponding to mean transvalvular gradient estimated 39 mmHg.  Left and right heart catheterization  10/21/18 confirmed the presence of severe aortic stenosis with peak to peak and mean transvalvular gradients measured 50 and 44.2 mmHg respectively.  There was mild nonobstructive coronary artery disease  and right heart pressures were normal.   Cath 10/21/18:  Mid RCA lesion is 40% stenosed.  Ost Cx lesion is 20% stenosed.  Mid Cx lesion is 20% stenosed.  Dist LAD lesion is 20% stenosed.   1. Mild non-obstructive CAD 2. Severe aortic stenosis (mean gradient 44.2 mmHg, peak to peak gradient 50 mmHg)  Recommendations: Will continue workup for TAVR.   TTE 09/08/18:  1. The left ventricle has normal systolic function with an ejection fraction of 60-65%. The cavity size was normal. There is moderately increased left ventricular wall thickness. Left ventricular diastolic Doppler parameters are consistent with impaired  relaxation.  2. The right ventricle has normal systolic function. The cavity was normal.  3. The mitral valve is grossly normal.  4. The tricuspid valve is grossly normal.  5. The aortic valveis bicuspid. Moderate calcification of the aortic valve. Aortic valve regurgitation is trivial by color flow Doppler. Moderate-severe stenosis of the aortic valve.  6. There is mild dilatation of the ascending aorta and of the aortic root measuring 41 mm.  7. Normal LV function; moderate LVH; moderate diastolic dysfunction; mildly dilated aortic root; functionally bicuspid aortic valve with fusion or left and right cusps; moderate to severe AS (mean gradient 39 mmHg and peak velocity of 4 m/s). )     Anesthesia Quick Evaluation

## 2018-11-03 ENCOUNTER — Ambulatory Visit (HOSPITAL_BASED_OUTPATIENT_CLINIC_OR_DEPARTMENT_OTHER)
Admission: RE | Admit: 2018-11-03 | Discharge: 2018-11-03 | Disposition: A | Discharge status: Home / Self Care | Payer: Medicare Other | Source: Ambulatory Visit | Attending: Cardiovascular Disease | Admitting: Cardiovascular Disease

## 2018-11-03 ENCOUNTER — Encounter (HOSPITAL_COMMUNITY)
Admission: RE | Disposition: A | Discharge status: Home / Self Care | Payer: Self-pay | Source: Home / Self Care | Attending: Thoracic Surgery (Cardiothoracic Vascular Surgery)

## 2018-11-03 ENCOUNTER — Ambulatory Visit (HOSPITAL_COMMUNITY): Payer: Medicare Other | Admitting: Certified Registered Nurse Anesthetist

## 2018-11-03 ENCOUNTER — Ambulatory Visit (HOSPITAL_COMMUNITY): Payer: Medicare Other | Admitting: Physician Assistant

## 2018-11-03 ENCOUNTER — Encounter (HOSPITAL_COMMUNITY): Payer: Self-pay | Admitting: Certified Registered Nurse Anesthetist

## 2018-11-03 ENCOUNTER — Inpatient Hospital Stay (HOSPITAL_COMMUNITY)
Admission: RE | Admit: 2018-11-03 | Discharge: 2018-11-04 | DRG: 267 | Disposition: A | Discharge status: Home / Self Care | Payer: Medicare Other | Attending: Thoracic Surgery (Cardiothoracic Vascular Surgery) | Admitting: Thoracic Surgery (Cardiothoracic Vascular Surgery)

## 2018-11-03 ENCOUNTER — Other Ambulatory Visit: Payer: Self-pay

## 2018-11-03 ENCOUNTER — Inpatient Hospital Stay (HOSPITAL_COMMUNITY): Payer: Medicare Other

## 2018-11-03 DIAGNOSIS — I77810 Thoracic aortic ectasia: Secondary | ICD-10-CM | POA: Diagnosis present

## 2018-11-03 DIAGNOSIS — Z85828 Personal history of other malignant neoplasm of skin: Secondary | ICD-10-CM

## 2018-11-03 DIAGNOSIS — Z6838 Body mass index (BMI) 38.0-38.9, adult: Secondary | ICD-10-CM

## 2018-11-03 DIAGNOSIS — J9811 Atelectasis: Secondary | ICD-10-CM

## 2018-11-03 DIAGNOSIS — Z79891 Long term (current) use of opiate analgesic: Secondary | ICD-10-CM | POA: Diagnosis not present

## 2018-11-03 DIAGNOSIS — Z800 Family history of malignant neoplasm of digestive organs: Secondary | ICD-10-CM

## 2018-11-03 DIAGNOSIS — G4733 Obstructive sleep apnea (adult) (pediatric): Secondary | ICD-10-CM | POA: Diagnosis present

## 2018-11-03 DIAGNOSIS — Z981 Arthrodesis status: Secondary | ICD-10-CM | POA: Diagnosis not present

## 2018-11-03 DIAGNOSIS — I2510 Atherosclerotic heart disease of native coronary artery without angina pectoris: Secondary | ICD-10-CM | POA: Diagnosis present

## 2018-11-03 DIAGNOSIS — Z79899 Other long term (current) drug therapy: Secondary | ICD-10-CM | POA: Diagnosis not present

## 2018-11-03 DIAGNOSIS — Z8673 Personal history of transient ischemic attack (TIA), and cerebral infarction without residual deficits: Secondary | ICD-10-CM

## 2018-11-03 DIAGNOSIS — I350 Nonrheumatic aortic (valve) stenosis: Secondary | ICD-10-CM

## 2018-11-03 DIAGNOSIS — I5032 Chronic diastolic (congestive) heart failure: Secondary | ICD-10-CM | POA: Diagnosis present

## 2018-11-03 DIAGNOSIS — I10 Essential (primary) hypertension: Secondary | ICD-10-CM | POA: Diagnosis present

## 2018-11-03 DIAGNOSIS — E785 Hyperlipidemia, unspecified: Secondary | ICD-10-CM | POA: Diagnosis present

## 2018-11-03 DIAGNOSIS — G40909 Epilepsy, unspecified, not intractable, without status epilepticus: Secondary | ICD-10-CM | POA: Diagnosis present

## 2018-11-03 DIAGNOSIS — Z954 Presence of other heart-valve replacement: Secondary | ICD-10-CM

## 2018-11-03 DIAGNOSIS — Z8249 Family history of ischemic heart disease and other diseases of the circulatory system: Secondary | ICD-10-CM

## 2018-11-03 DIAGNOSIS — Z7902 Long term (current) use of antithrombotics/antiplatelets: Secondary | ICD-10-CM

## 2018-11-03 DIAGNOSIS — Z006 Encounter for examination for normal comparison and control in clinical research program: Secondary | ICD-10-CM | POA: Diagnosis not present

## 2018-11-03 DIAGNOSIS — Z803 Family history of malignant neoplasm of breast: Secondary | ICD-10-CM

## 2018-11-03 DIAGNOSIS — G8929 Other chronic pain: Secondary | ICD-10-CM | POA: Diagnosis present

## 2018-11-03 DIAGNOSIS — K589 Irritable bowel syndrome without diarrhea: Secondary | ICD-10-CM | POA: Diagnosis present

## 2018-11-03 DIAGNOSIS — Z952 Presence of prosthetic heart valve: Secondary | ICD-10-CM | POA: Diagnosis not present

## 2018-11-03 DIAGNOSIS — Z7982 Long term (current) use of aspirin: Secondary | ICD-10-CM

## 2018-11-03 DIAGNOSIS — K30 Functional dyspepsia: Secondary | ICD-10-CM | POA: Diagnosis present

## 2018-11-03 DIAGNOSIS — I110 Hypertensive heart disease with heart failure: Secondary | ICD-10-CM | POA: Diagnosis present

## 2018-11-03 HISTORY — DX: Presence of prosthetic heart valve: Z95.2

## 2018-11-03 HISTORY — PX: TEE WITHOUT CARDIOVERSION: SHX5443

## 2018-11-03 HISTORY — PX: TRANSCATHETER AORTIC VALVE REPLACEMENT, TRANSFEMORAL: SHX6400

## 2018-11-03 LAB — POCT I-STAT 4, (NA,K, GLUC, HGB,HCT)
Glucose, Bld: 138 mg/dL — ABNORMAL HIGH (ref 70–99)
Glucose, Bld: 167 mg/dL — ABNORMAL HIGH (ref 70–99)
HCT: 41 % (ref 39.0–52.0)
HCT: 42 % (ref 39.0–52.0)
Hemoglobin: 13.9 g/dL (ref 13.0–17.0)
Hemoglobin: 14.3 g/dL (ref 13.0–17.0)
Potassium: 3.7 mmol/L (ref 3.5–5.1)
Potassium: 4 mmol/L (ref 3.5–5.1)
Sodium: 140 mmol/L (ref 135–145)
Sodium: 140 mmol/L (ref 135–145)

## 2018-11-03 LAB — GLUCOSE, CAPILLARY
Glucose-Capillary: 131 mg/dL — ABNORMAL HIGH (ref 70–99)
Glucose-Capillary: 211 mg/dL — ABNORMAL HIGH (ref 70–99)

## 2018-11-03 LAB — POCT I-STAT, CHEM 8
BUN: 10 mg/dL (ref 8–23)
Calcium, Ion: 1.21 mmol/L (ref 1.15–1.40)
Chloride: 103 mmol/L (ref 98–111)
Creatinine, Ser: 0.8 mg/dL (ref 0.61–1.24)
Glucose, Bld: 165 mg/dL — ABNORMAL HIGH (ref 70–99)
HCT: 43 % (ref 39.0–52.0)
Hemoglobin: 14.6 g/dL (ref 13.0–17.0)
Potassium: 4.3 mmol/L (ref 3.5–5.1)
Sodium: 139 mmol/L (ref 135–145)
TCO2: 27 mmol/L (ref 22–32)

## 2018-11-03 LAB — POCT ACTIVATED CLOTTING TIME
Activated Clotting Time: 149 seconds
Activated Clotting Time: 422 seconds
Activated Clotting Time: 132 seconds

## 2018-11-03 SURGERY — IMPLANTATION, AORTIC VALVE, TRANSCATHETER, FEMORAL APPROACH
Anesthesia: Monitor Anesthesia Care

## 2018-11-03 MED ORDER — MIDAZOLAM HCL 2 MG/2ML IJ SOLN
INTRAMUSCULAR | Status: AC
  Filled 2018-11-03: qty 2

## 2018-11-03 MED ORDER — SODIUM CHLORIDE 0.9 % IV SOLN: INTRAVENOUS | Status: DC

## 2018-11-03 MED ORDER — ACETAMINOPHEN 500 MG PO TABS
ORAL_TABLET | ORAL | Status: AC
  Filled 2018-11-03: qty 2

## 2018-11-03 MED ORDER — SODIUM CHLORIDE 0.9 % IV SOLN: 250.0000 mL | INTRAVENOUS | Status: DC | PRN

## 2018-11-03 MED ORDER — CHLORHEXIDINE GLUCONATE 4 % EX LIQD: 30.0000 mL | CUTANEOUS | Status: DC

## 2018-11-03 MED ORDER — HEPARIN SODIUM (PORCINE) 1000 UNIT/ML IJ SOLN
INTRAMUSCULAR | Status: DC | PRN
  Administered 2018-11-03: 19000 [IU] via INTRAVENOUS

## 2018-11-03 MED ORDER — MIDAZOLAM HCL 5 MG/5ML IJ SOLN
INTRAMUSCULAR | Status: DC | PRN
  Administered 2018-11-03: 2 mg via INTRAVENOUS

## 2018-11-03 MED ORDER — CLOPIDOGREL BISULFATE 75 MG PO TABS
75.0000 mg | ORAL_TABLET | Freq: Every day | ORAL | Status: DC
  Administered 2018-11-04: 75 mg via ORAL
  Filled 2018-11-03: qty 1

## 2018-11-03 MED ORDER — TRAMADOL HCL 50 MG PO TABS: 50.0000 mg | ORAL_TABLET | ORAL | Status: DC | PRN

## 2018-11-03 MED ORDER — ASPIRIN EC 81 MG PO TBEC
81.0000 mg | DELAYED_RELEASE_TABLET | Freq: Every day | ORAL | Status: DC
  Administered 2018-11-04: 81 mg via ORAL
  Filled 2018-11-03 (×2): qty 1

## 2018-11-03 MED ORDER — LEVETIRACETAM 250 MG PO TABS
250.0000 mg | ORAL_TABLET | Freq: Every day | ORAL | Status: DC
  Administered 2018-11-04: 250 mg via ORAL
  Filled 2018-11-03 (×2): qty 1

## 2018-11-03 MED ORDER — ONDANSETRON HCL 4 MG/2ML IJ SOLN
INTRAMUSCULAR | Status: DC | PRN
  Administered 2018-11-03: 4 mg via INTRAVENOUS

## 2018-11-03 MED ORDER — IOHEXOL 350 MG/ML SOLN
INTRAVENOUS | Status: DC | PRN
  Administered 2018-11-03: 40 mL via INTRA_ARTERIAL

## 2018-11-03 MED ORDER — INSULIN ASPART 100 UNIT/ML ~~LOC~~ SOLN
0.0000 [IU] | SUBCUTANEOUS | Status: DC
  Administered 2018-11-03: 8 [IU] via SUBCUTANEOUS
  Administered 2018-11-04: 2 [IU] via SUBCUTANEOUS

## 2018-11-03 MED ORDER — DOXAZOSIN MESYLATE 2 MG PO TABS
4.0000 mg | ORAL_TABLET | Freq: Every day | ORAL | Status: DC
  Administered 2018-11-03: 4 mg via ORAL
  Filled 2018-11-03: qty 2

## 2018-11-03 MED ORDER — DEXMEDETOMIDINE HCL IN NACL 200 MCG/50ML IV SOLN
INTRAVENOUS | Status: DC | PRN
  Administered 2018-11-03: 122.6 ug via INTRAVENOUS

## 2018-11-03 MED ORDER — CHLORHEXIDINE GLUCONATE 0.12 % MT SOLN: 15.0000 mL | Freq: Once | OROMUCOSAL | Status: DC

## 2018-11-03 MED ORDER — SODIUM CHLORIDE 0.9% FLUSH
3.0000 mL | Freq: Two times a day (BID) | INTRAVENOUS | Status: DC
  Administered 2018-11-04 (×2): 3 mL via INTRAVENOUS

## 2018-11-03 MED ORDER — SODIUM CHLORIDE 0.9 % IV SOLN
INTRAVENOUS | Status: DC
  Administered 2018-11-03: 16:00:00 via INTRAVENOUS

## 2018-11-03 MED ORDER — ACETAMINOPHEN 500 MG PO TABS: 1000.0000 mg | ORAL_TABLET | Freq: Four times a day (QID) | ORAL | Status: DC | PRN

## 2018-11-03 MED ORDER — ACETAMINOPHEN 650 MG RE SUPP: 650.0000 mg | Freq: Four times a day (QID) | RECTAL | Status: DC | PRN

## 2018-11-03 MED ORDER — LIDOCAINE HCL (PF) 1 % IJ SOLN
INTRAMUSCULAR | Status: DC | PRN
  Administered 2018-11-03: 15 mL
  Administered 2018-11-03: 15 mL via INTRADERMAL

## 2018-11-03 MED ORDER — METOPROLOL TARTRATE 5 MG/5ML IV SOLN: 2.5000 mg | INTRAVENOUS | Status: DC | PRN

## 2018-11-03 MED ORDER — ACETAMINOPHEN 500 MG PO TABS
1000.0000 mg | ORAL_TABLET | Freq: Once | ORAL | Status: AC
  Administered 2018-11-03: 1000 mg via ORAL
  Filled 2018-11-03: qty 2

## 2018-11-03 MED ORDER — PHENYLEPHRINE HCL-NACL 20-0.9 MG/250ML-% IV SOLN: 0.0000 ug/min | INTRAVENOUS | Status: DC

## 2018-11-03 MED ORDER — OXYCODONE HCL 5 MG PO TABS
5.0000 mg | ORAL_TABLET | ORAL | Status: DC | PRN
  Administered 2018-11-04: 10 mg via ORAL
  Filled 2018-11-03: qty 2

## 2018-11-03 MED ORDER — ONDANSETRON HCL 4 MG/2ML IJ SOLN
4.0000 mg | Freq: Four times a day (QID) | INTRAMUSCULAR | Status: DC | PRN
  Administered 2018-11-03: 4 mg via INTRAVENOUS
  Filled 2018-11-03: qty 2

## 2018-11-03 MED ORDER — VANCOMYCIN HCL IN DEXTROSE 1-5 GM/200ML-% IV SOLN
1000.0000 mg | Freq: Once | INTRAVENOUS | Status: DC
  Filled 2018-11-03: qty 200

## 2018-11-03 MED ORDER — FENTANYL CITRATE (PF) 100 MCG/2ML IJ SOLN
INTRAMUSCULAR | Status: DC | PRN
  Administered 2018-11-03: 25 ug via INTRAVENOUS
  Administered 2018-11-03: 50 ug via INTRAVENOUS
  Administered 2018-11-03: 25 ug via INTRAVENOUS

## 2018-11-03 MED ORDER — FENTANYL CITRATE (PF) 100 MCG/2ML IJ SOLN
INTRAMUSCULAR | Status: AC
  Filled 2018-11-03: qty 2

## 2018-11-03 MED ORDER — NITROGLYCERIN IN D5W 200-5 MCG/ML-% IV SOLN: 0.0000 ug/min | INTRAVENOUS | Status: DC

## 2018-11-03 MED ORDER — MORPHINE SULFATE (PF) 2 MG/ML IV SOLN
1.0000 mg | INTRAVENOUS | Status: DC | PRN
  Administered 2018-11-04: 2 mg via INTRAVENOUS
  Filled 2018-11-03: qty 1

## 2018-11-03 MED ORDER — SODIUM CHLORIDE 0.9 % IV SOLN
INTRAVENOUS | Status: DC
  Administered 2018-11-03: 1000 mL via INTRAVENOUS

## 2018-11-03 MED ORDER — LOSARTAN POTASSIUM 50 MG PO TABS
50.0000 mg | ORAL_TABLET | Freq: Every day | ORAL | Status: DC
  Administered 2018-11-04: 50 mg via ORAL
  Filled 2018-11-03: qty 1

## 2018-11-03 MED ORDER — HEPARIN (PORCINE) IN NACL 1000-0.9 UT/500ML-% IV SOLN
INTRAVENOUS | Status: DC | PRN
  Administered 2018-11-03 (×3): 500 mL

## 2018-11-03 MED ORDER — ATORVASTATIN CALCIUM 80 MG PO TABS: 80.0000 mg | ORAL_TABLET | Freq: Every day | ORAL | Status: DC

## 2018-11-03 MED ORDER — SODIUM CHLORIDE 0.9% FLUSH: 3.0000 mL | INTRAVENOUS | Status: DC | PRN

## 2018-11-03 MED ORDER — SODIUM CHLORIDE 0.9 % IV SOLN
1.5000 g | Freq: Two times a day (BID) | INTRAVENOUS | Status: DC
  Administered 2018-11-04 (×2): 1.5 g via INTRAVENOUS
  Filled 2018-11-03 (×3): qty 1.5

## 2018-11-03 MED ORDER — PROPOFOL 500 MG/50ML IV EMUL
INTRAVENOUS | Status: DC | PRN
  Administered 2018-11-03: 10 ug/kg/min via INTRAVENOUS

## 2018-11-03 MED ORDER — VANCOMYCIN HCL 10 G IV SOLR
1250.0000 mg | Freq: Once | INTRAVENOUS | Status: AC
  Administered 2018-11-04: 1250 mg via INTRAVENOUS
  Filled 2018-11-03: qty 1250

## 2018-11-03 MED ORDER — DUTASTERIDE 0.5 MG PO CAPS
0.5000 mg | ORAL_CAPSULE | Freq: Every day | ORAL | Status: DC
  Administered 2018-11-04: 0.5 mg via ORAL
  Filled 2018-11-03: qty 1

## 2018-11-03 MED ORDER — LIDOCAINE HCL 1 % IJ SOLN
INTRAMUSCULAR | Status: AC
  Filled 2018-11-03: qty 20

## 2018-11-03 MED ORDER — CHLORHEXIDINE GLUCONATE 4 % EX LIQD
60.0000 mL | Freq: Once | CUTANEOUS | Status: DC
  Filled 2018-11-03: qty 60

## 2018-11-03 MED ORDER — LACTATED RINGERS IV SOLN
INTRAVENOUS | Status: DC
  Administered 2018-11-03: 1000 mL via INTRAVENOUS

## 2018-11-03 MED ORDER — ACETAMINOPHEN 325 MG PO TABS: 650.0000 mg | ORAL_TABLET | Freq: Four times a day (QID) | ORAL | Status: DC | PRN

## 2018-11-03 MED ORDER — PROTAMINE SULFATE 10 MG/ML IV SOLN
INTRAVENOUS | Status: DC | PRN
  Administered 2018-11-03: 190 mg via INTRAVENOUS

## 2018-11-03 MED ORDER — NAPHAZOLINE-GLYCERIN 0.012-0.2 % OP SOLN
1.0000 [drp] | Freq: Four times a day (QID) | OPHTHALMIC | Status: DC | PRN
  Filled 2018-11-03: qty 15

## 2018-11-03 SURGICAL SUPPLY — 33 items
BAG SNAP BAND KOVER 36X36 (MISCELLANEOUS) ×10 IMPLANT
BLANKET WARM UNDERBOD FULL ACC (MISCELLANEOUS) ×3 IMPLANT
CABLE ADAPT PACING TEMP 12FT (ADAPTER) ×2 IMPLANT
CATH 26 EDWARDS DELIVERY SYS (CATHETERS) ×2 IMPLANT
CATH DIAG 6FR PIGTAIL ANGLED (CATHETERS) ×4 IMPLANT
CATH INFINITI 6F AL2 (CATHETERS) ×2 IMPLANT
CATH S G BIP PACING (CATHETERS) ×2 IMPLANT
CLOSURE MYNX CONTROL 6F/7F (Vascular Products) ×2 IMPLANT
COVER DOME SNAP 22 D (MISCELLANEOUS) ×2 IMPLANT
CRIMPER (MISCELLANEOUS) ×2 IMPLANT
DEVICE CLOSURE PERCLS PRGLD 6F (VASCULAR PRODUCTS) IMPLANT
DEVICE INFLATION ATRION QL2530 (MISCELLANEOUS) ×2 IMPLANT
GUIDEWIRE SAFE TJ AMPLATZ EXST (WIRE) ×2 IMPLANT
KIT HEART LEFT (KITS) ×3 IMPLANT
KIT MICROPUNCTURE NIT STIFF (SHEATH) ×2 IMPLANT
PACK CARDIAC CATHETERIZATION (CUSTOM PROCEDURE TRAY) ×3 IMPLANT
PERCLOSE PROGLIDE 6F (VASCULAR PRODUCTS) ×6
SHEATH 14X36 EDWARDS (SHEATH) ×2 IMPLANT
SHEATH BRITE TIP 7FR 35CM (SHEATH) ×2 IMPLANT
SHEATH PINNACLE 6F 10CM (SHEATH) ×2 IMPLANT
SHEATH PINNACLE 8F 10CM (SHEATH) ×2 IMPLANT
SHEATH PROBE COVER 6X72 (BAG) ×2 IMPLANT
SLEEVE REPOSITIONING LENGTH 30 (MISCELLANEOUS) ×2 IMPLANT
STOPCOCK MORSE 400PSI 3WAY (MISCELLANEOUS) ×6 IMPLANT
TRANSDUCER W/STOPCOCK (MISCELLANEOUS) ×6 IMPLANT
TUBE CONN 8.8X1320 FR HP M-F (CONNECTOR) ×2 IMPLANT
TUBING ART PRESS 72  MALE/FEM (TUBING) ×2
TUBING ART PRESS 72 MALE/FEM (TUBING) IMPLANT
VALVE HEART TRANSCATH SZ3 26MM (Valve) ×2 IMPLANT
WIRE AMPLATZ SS-J .035X180CM (WIRE) ×2 IMPLANT
WIRE EMERALD 3MM-J .035X150CM (WIRE) ×2 IMPLANT
WIRE EMERALD 3MM-J .035X260CM (WIRE) ×2 IMPLANT
WIRE EMERALD ST .035X260CM (WIRE) ×2 IMPLANT

## 2018-11-03 NOTE — Op Note (Signed)
HEART AND VASCULAR CENTER   MULTIDISCIPLINARY HEART VALVE TEAM   TAVR OPERATIVE NOTE   Date of Procedure:  11/03/2018  Preoperative Diagnosis: Severe Aortic Stenosis   Postoperative Diagnosis: Same   Procedure:    Transcatheter Aortic Valve Replacement - Percutaneous Right Transfemoral Approach  Edwards Sapien 3 THV (size 26 mm, model # 9600TFX, serial # [id number])   Co-Surgeons:  Valentina Gu. Roxy Manns, MD and Lauree Chandler, MD  Anesthesiologist:  Adele Barthel, MD  Echocardiographer:  Sanda Klein, MD  Pre-operative Echo Findings:  Severe aortic stenosis  Normal left ventricular systolic function  Post-operative Echo Findings:  Trace paravalvular leak  Normal left ventricular systolic function   BRIEF CLINICAL NOTE AND INDICATIONS FOR SURGERY  Patient is 73 year old obese male with history of aortic stenosis, previous stroke, hypertension, hyperlipidemia, obstructive sleep apnea on CPAP, and skin cancer who has been referred for surgical consultation to discuss treatment options for management of severe symptomatic aortic stenosis.  Patient states that he was first noted to have a heart murmur on physical exam performed by nurse during screening evaluation for his insurance in July 2019.  In September 2019 the patient was admitted with an acute stroke.  Symptoms were primarily related to expressive aphasia.  He was diagnosed with a small acute infarct in the left MCA distribution.  He was started on dual antiplatelet therapy and discharged from the hospital but readmitted within 48 hours with new neurologic symptoms and MRI consistent with propagation of previous stroke.  He had a witnessed seizure and Keppra was started.  During his hospitalization a transesophageal echocardiogram was performed to rule out source of embolization.  The patient was noted to have moderate aortic stenosis.  There was no clot in the left atrial appendage.  There was no history of atrial  fibrillation.  Bubble study was negative.  Patient was noted to have mild carotid Doppler disease.  He was diagnosed with obstructive sleep apnea and started on CPAP.  The patient has been followed intermittently ever since by Dr. Meda Coffee.  During follow-up telemedicine visit on Sep 17, 2018 the patient reported worsening fatigue and exertional shortness of breath with occasional vague chest pressure.  Transthoracic echocardiogram performed Sep 08, 2018 revealed preserved left ventricular systolic function with moderate to severe aortic stenosis.  Peak velocity across the aortic valve range between 3.8 and 4 m/s corresponding to mean transvalvular gradient estimated 39 mmHg.  The DVI was reported 0.26 and aortic valve area calculated 1.25 cm.  The patient was referred to the multidisciplinary heart valve clinic and evaluated by Dr. [name] Oct 01, 2018.  Left and right heart catheterization performed October 21, 2018 confirmed the presence of severe aortic stenosis with peak to peak and mean transvalvular gradients measured 50 and 44.2 mmHg respectively.  There was mild nonobstructive coronary artery disease and right heart pressures were normal.  CT angiography was performed and the patient was referred for surgical consultation.  During the course of the patient's preoperative work up they have been evaluated comprehensively by a multidisciplinary team of specialists coordinated through the [facility] in the [facility].  They have been demonstrated to suffer from symptomatic severe aortic stenosis as noted above. The patient has been counseled extensively as to the relative risks and benefits of all options for the treatment of severe aortic stenosis including long term medical therapy, conventional surgery for aortic valve replacement, and transcatheter aortic valve replacement.  All questions have been answered, and  the patient provides full informed consent  for the operation as described.   DETAILS OF THE OPERATIVE PROCEDURE  PREPARATION:    The patient is brought to the operating room on the above mentioned date and appropriate monitoring was established by the anesthesia team. The patient is placed in the supine position on the operating table.  Intravenous antibiotics are administered. The patient is monitored closely throughout the procedure under conscious sedation.  Baseline transthoracic echocardiogram was performed. The patient's chest, abdomen, both groins, and both lower extremities are prepared and draped in a sterile manner. A time out procedure is performed.   PERIPHERAL ACCESS:    Using the modified Seldinger technique, femoral arterial and venous access was obtained with placement of 6 Fr sheaths on the left side.  A pigtail diagnostic catheter was passed through the left arterial sheath under fluoroscopic guidance into the aortic root.  A temporary transvenous pacemaker catheter was passed through the left femoral venous sheath under fluoroscopic guidance into the right ventricle.  The pacemaker was tested to ensure stable lead placement and pacemaker capture. Aortic root angiography was performed in order to determine the optimal angiographic angle for valve deployment.   TRANSFEMORAL ACCESS:   Percutaneous transfemoral access and sheath placement was performed using ultrasound guidance.  The right common femoral artery was cannulated using a micropuncture needle and appropriate location was verified using hand injection angiogram.  A pair of Abbott Perclose percutaneous closure devices were placed and a 6 French sheath replaced into the femoral artery.  The patient was heparinized systemically and ACT verified > 250 seconds.    A 14 Fr transfemoral E-sheath was introduced into the right common femoral artery after progressively dilating over an Amplatz superstiff wire. An AL-2 catheter was used to direct a straight-tip exchange  length wire across the native aortic valve into the left ventricle. This was exchanged out for a pigtail catheter and position was confirmed in the LV apex. Simultaneous LV and Ao pressures were recorded.  The pigtail catheter was exchanged for an Amplatz Extra-stiff wire in the LV apex.  Echocardiography was utilized to confirm appropriate wire position and no sign of entanglement in the mitral subvalvular apparatus.   TRANSCATHETER HEART VALVE DEPLOYMENT:   An Edwards Sapien 3 transcatheter heart valve (size 26 mm, model #[id number], serial #[id number]) was prepared and crimped per manufacturer's guidelines, and the proper orientation of the valve is confirmed on the [employer] delivery system. The valve was advanced through the introducer sheath using normal technique until in an appropriate position in the abdominal aorta beyond the sheath tip. The balloon was then retracted and using the fine-tuning wheel was centered on the valve. The valve was then advanced across the aortic arch using appropriate flexion of the catheter. The valve was carefully positioned across the aortic valve annulus. The Commander catheter was retracted using normal technique. Once final position of the valve has been confirmed by angiographic assessment, the valve is deployed while temporarily holding ventilation and during rapid ventricular pacing to maintain systolic blood pressure < 50 mmHg and pulse pressure < 10 mmHg. The balloon inflation is held for >3 seconds after reaching full deployment volume. Once the balloon has fully deflated the balloon is retracted into the ascending aorta and valve function is assessed using echocardiography. There is felt to be trace paravalvular leak and no central aortic insufficiency.  The patient's hemodynamic recovery following valve deployment is good.  The deployment balloon and guidewire are both removed.  PROCEDURE COMPLETION:   The sheath was removed and femoral artery closure  performed.  Protamine was administered once femoral arterial repair was complete. The temporary pacemaker, pigtail catheters and femoral sheaths were removed with manual pressure used for hemostasis.  A Mynx femoral closure device was utilized following removal of the diagnostic sheath in the left femoral artery.  The patient tolerated the procedure well and is transported to the surgical intensive care in stable condition. There were no immediate intraoperative complications. All sponge instrument and needle counts are verified correct at completion of the operation.   No blood products were administered during the operation.  The patient received a total of 40 mL of intravenous contrast during the procedure.   Rexene Alberts, MD 11/03/2018 2:53 PM

## 2018-11-03 NOTE — Anesthesia Procedure Notes (Signed)
Procedure Name: MAC Date/Time: 11/03/2018 1:23 PM Performed by: Candis Shine, CRNA Pre-anesthesia Checklist: Patient identified, Emergency Drugs available, Suction available, Patient being monitored and Timeout performed Patient Re-evaluated:Patient Re-evaluated prior to induction Oxygen Delivery Method: Simple face mask Dental Injury: Teeth and Oropharynx as per pre-operative assessment

## 2018-11-03 NOTE — Anesthesia Procedure Notes (Signed)
Arterial Line Insertion Start/End6/30/2020 12:15 PM Performed by: Alain Marion, CRNA, CRNA  Preanesthetic checklist: patient identified, IV checked, site marked, risks and benefits discussed, surgical consent, monitors and equipment checked, pre-op evaluation, timeout performed and anesthesia consent Lidocaine 1% used for infiltration Right, radial was placed Catheter size: 20 G Hand hygiene performed  and maximum sterile barriers used   Attempts: 1 Procedure performed without using ultrasound guided technique. Following insertion, dressing applied and Biopatch. Post procedure assessment: normal and unchanged  Patient tolerated the procedure well with no immediate complications.

## 2018-11-03 NOTE — Progress Notes (Signed)
Patient arrived from cath lab to [id number]. Vital signs obtained and patient placed on monitor. Patient bed rest completed. Patient assisted to chair. And patient to void. b groins level 0. Will monitor patient. [name], Bettina Gavia rN

## 2018-11-03 NOTE — Anesthesia Postprocedure Evaluation (Signed)
Anesthesia Post Note  Patient: [name]  Procedure(s) Performed: TRANSCATHETER AORTIC VALVE REPLACEMENT, TRANSFEMORAL (N/A ) TRANSESOPHAGEAL ECHOCARDIOGRAM (TEE) (N/A )     Patient location during evaluation: Cath Lab Anesthesia Type: MAC Level of consciousness: awake and alert Pain management: pain level controlled Vital Signs Assessment: post-procedure vital signs reviewed and stable Respiratory status: spontaneous breathing, nonlabored ventilation, respiratory function stable and patient connected to nasal cannula oxygen Cardiovascular status: stable and blood pressure returned to baseline Postop Assessment: no apparent nausea or vomiting Anesthetic complications: no    Last Vitals:  Vitals:   11/03/18 1650 11/03/18 1652  BP: (!) 116/52 (!) 110/57  Pulse: (!) 50 (!) 52  Resp: 18 19  Temp:    SpO2: 96% 93%    Last Pain:  Vitals:   11/03/18 1533  TempSrc: Temporal  PainSc:                  [name]

## 2018-11-03 NOTE — Transfer of Care (Signed)
Immediate Anesthesia Transfer of Care Note  Patient: [name]  Procedure(s) Performed: TRANSCATHETER AORTIC VALVE REPLACEMENT, TRANSFEMORAL (N/A ) TRANSESOPHAGEAL ECHOCARDIOGRAM (TEE) (N/A )  Patient Location: Cath Lab  Anesthesia Type:MAC  Level of Consciousness: drowsy  Airway & Oxygen Therapy: Patient Spontanous Breathing and Patient connected to nasal cannula oxygen  Post-op Assessment: Report given to RN and Post -op Vital signs reviewed and stable  Post vital signs: Reviewed and stable  Last Vitals:  Vitals Value Taken Time  BP    Temp    Pulse 0 11/03/18 1526  Resp 16 11/03/18 1533  SpO2    Vitals shown include unvalidated device data.  Last Pain:  Vitals:   11/03/18 1049  PainSc: 0-No pain      Patients Stated Pain Goal: 2 (03/88/82 8003)  Complications: No apparent anesthesia complications

## 2018-11-03 NOTE — Progress Notes (Signed)
    R radial art line, 20 g, was removed and manual pressure was held for 6 min. R wrist is soft and non tender. R radial pulse is palpable, +2.

## 2018-11-03 NOTE — CV Procedure (Signed)
HEART AND VASCULAR CENTER  TAVR OPERATIVE NOTE   Date of Procedure:  11/03/2018  Preoperative Diagnosis: Severe Aortic Stenosis   Postoperative Diagnosis: Same   Procedure:    Transcatheter Aortic Valve Replacement - Transfemoral Approach  Edwards Sapien 3 THV (size 26 mm, model # [id number], serial # [id number])   Co-Surgeons:  Lauree Chandler, MD and Valentina Gu. Roxy Manns, MD   Anesthesiologist:  Roanna Banning  Echocardiographer:  Croitoru  Pre-operative Echo Findings:  Severe aortic stenosis  Normal left ventricular systolic function  Post-operative Echo Findings:  No paravalvular leak  Normal left ventricular systolic function  BRIEF CLINICAL NOTE AND INDICATIONS FOR SURGERY  73 yo male with history of skin cancer, obesity, prior CVA, hyperlipidemia, HTN, sleep apnea and severe aortic stenosis who is here today for TAVR. He is followed in our cardiology office by Dr. Meda Coffee. He was admitted to [facility] September 2019 with a stroke. TEE in September 2019 with moderate aortic stenosis (mean gradient 36 mmHg, peak velocity 3.7 m/sec) and normal LV systolic function. There was mild aortic valve insufficiency. No atrial arrhythmias have been documented. He had a seizure in the setting of his acute stroke and has been on Keppra since then. Repeat echo December 2019 with LVEF=60-65% and moderate AS with mean gradient 37 mmHg, peak gradient 61 mmHg, AVA 1.24 cm2. He was seen by Dr. Meda Coffee with a Telemedicine visit on 09/17/18 and reported progressive fatigue as well as dyspnea on exertion and chest pressure with exertion. Echo May 2020 with LVEF=60-65%. The aortic valve appears to be bicuspid. Moderately severe aortic stenosis with mean gradient 39 mmHg, dimensionless index 0.26. AVA 1.25 cm2). The valve leaflets are thickened and calcified. Mild CAD by cath.   During the course of the patient's preoperative work up they have been evaluated comprehensively by a multidisciplinary team of  specialists coordinated through the [facility] in the [place] [facility].  They have been demonstrated to suffer from symptomatic severe aortic stenosis as noted above. The patient has been counseled extensively as to the relative risks and benefits of all options for the treatment of severe aortic stenosis including long term medical therapy, conventional surgery for aortic valve replacement, and transcatheter aortic valve replacement.  The patient has been independently evaluated by Dr. Roxy Manns with CT surgery and they are felt to be at high risk for conventional surgical aortic valve replacement. The surgeon indicated the patient would be a poor candidate for conventional surgery. Based upon review of all of the patient's preoperative diagnostic tests they are felt to be candidate for transcatheter aortic valve replacement using the transfemoral approach as an alternative to high risk conventional surgery.    Following the decision to proceed with transcatheter aortic valve replacement, a discussion has been held regarding what types of management strategies would be attempted intraoperatively in the event of life-threatening complications, including whether or not the patient would be considered a candidate for the use of cardiopulmonary bypass and/or conversion to open sternotomy for attempted surgical intervention.  The patient has been advised of a variety of complications that might develop peculiar to this approach including but not limited to risks of death, stroke, paravalvular leak, aortic dissection or other major vascular complications, aortic annulus rupture, device embolization, cardiac rupture or perforation, acute myocardial infarction, arrhythmia, heart block or bradycardia requiring permanent pacemaker placement, congestive heart failure, respiratory failure, renal failure, pneumonia, infection, other late complications related to structural valve  deterioration or migration, or other complications  that might ultimately cause a temporary or permanent loss of functional independence or other long term morbidity.  The patient provides full informed consent for the procedure as described and all questions were answered preoperatively.    DETAILS OF THE OPERATIVE PROCEDURE  PREPARATION:   The patient is brought to the operating room on the above mentioned date and central monitoring was established by the anesthesia team including placement of a radial arterial line. The patient is placed in the supine position on the operating table.  Intravenous antibiotics are administered. Conscious sedation is used.   Baseline transthoracic echocardiogram was performed. The patient's chest, abdomen, both groins, and both lower extremities are prepared and draped in a sterile manner. A time out procedure is performed.   PERIPHERAL ACCESS:   Using the modified Seldinger technique, femoral arterial and venous access were obtained with placement of 6 Fr sheaths on the left side using u/s guidance.  A pigtail diagnostic catheter was passed through the femoral arterial sheath under fluoroscopic guidance into the aortic root.  A temporary transvenous pacemaker catheter was passed through the femoral venous sheath under fluoroscopic guidance into the right ventricle.  The pacemaker was tested to ensure stable lead placement and pacemaker capture. Aortic root angiography was performed in order to determine the optimal angiographic angle for valve deployment.  TRANSFEMORAL ACCESS:  A micropuncture kit was used to gain access to the right femoral artery using u/s guidance. Position confirmed with angiography. Pre-closure with double ProGlide closure devices. The patient was heparinized systemically and ACT verified > 250 seconds.    A 14 Fr transfemoral E-sheath was introduced into the right femoral artery after progressively dilating over an Amplatz superstiff wire.  An AL-2 catheter was used to direct a straight-tip exchange length wire across the native aortic valve into the left ventricle. This was exchanged out for a pigtail catheter and position was confirmed in the LV apex. Simultaneous LV and Ao pressures were recorded.  The pigtail catheter was then exchanged for an Amplatz Extra-stiff wire in the LV apex.   TRANSCATHETER HEART VALVE DEPLOYMENT:  An Edwards Sapien 3 THV (size 26 mm) was prepared and crimped per manufacturer's guidelines, and the proper orientation of the valve is confirmed on the [employer] delivery system. The valve was advanced through the introducer sheath using normal technique until in an appropriate position in the abdominal aorta beyond the sheath tip. The balloon was then retracted and using the fine-tuning wheel was centered on the valve. The valve was then advanced across the aortic arch using appropriate flexion of the catheter. The valve was carefully positioned across the aortic valve annulus. The Commander catheter was retracted using normal technique. Once final position of the valve has been confirmed by angiographic assessment, the valve is deployed while temporarily holding ventilation and during rapid ventricular pacing to maintain systolic blood pressure < 50 mmHg and pulse pressure < 10 mmHg. The balloon inflation is held for >3 seconds after reaching full deployment volume. Once the balloon has fully deflated the balloon is retracted into the ascending aorta and valve function is assessed using TTE. There is felt to be no paravalvular leak and no central aortic insufficiency.  The patient's hemodynamic recovery following valve deployment is good.  The deployment balloon and guidewire are both removed. Echo demostrated acceptable post-procedural gradients, stable mitral valve function, and no AI.   PROCEDURE COMPLETION:  The sheath was then removed and closure devices were completed. Protamine was administered once  femoral arterial  repair was complete. The temporary pacemaker, pigtail catheters and femoral sheaths were removed with Mynx closure device placement in the right femoral artery and manual pressure for hemostasis of the right femoral vein.    The patient tolerated the procedure well and is transported to the surgical intensive care in stable condition. There were no immediate intraoperative complications. All sponge instrument and needle counts are verified correct at completion of the operation.   No blood products were administered during the operation.  The patient received a total of 40 mL of intravenous contrast during the procedure.  Lauree Chandler MD 11/03/2018 2:47 PM

## 2018-11-04 ENCOUNTER — Inpatient Hospital Stay (HOSPITAL_COMMUNITY): Payer: Medicare Other

## 2018-11-04 ENCOUNTER — Encounter (HOSPITAL_COMMUNITY): Payer: Self-pay | Admitting: Cardiovascular Disease

## 2018-11-04 ENCOUNTER — Other Ambulatory Visit: Payer: Self-pay

## 2018-11-04 DIAGNOSIS — Z952 Presence of prosthetic heart valve: Secondary | ICD-10-CM

## 2018-11-04 DIAGNOSIS — Z954 Presence of other heart-valve replacement: Secondary | ICD-10-CM

## 2018-11-04 DIAGNOSIS — I350 Nonrheumatic aortic (valve) stenosis: Secondary | ICD-10-CM

## 2018-11-04 LAB — BASIC METABOLIC PANEL
Anion gap: 10 (ref 5–15)
BUN: 10 mg/dL (ref 8–23)
CO2: 25 mmol/L (ref 22–32)
Calcium: 8.3 mg/dL — ABNORMAL LOW (ref 8.9–10.3)
Chloride: 103 mmol/L (ref 98–111)
Creatinine, Ser: 1.01 mg/dL (ref 0.61–1.24)
GFR calc Af Amer: >60 mL/min (ref >60)
GFR calc non Af Amer: >60 mL/min (ref >60)
Glucose, Bld: 172 mg/dL — ABNORMAL HIGH (ref 70–99)
Potassium: 3.9 mmol/L (ref 3.5–5.1)
Sodium: 138 mmol/L (ref 135–145)

## 2018-11-04 LAB — CBC
HCT: 41.8 % (ref 39.0–52.0)
Hemoglobin: 13.9 g/dL (ref 13.0–17.0)
MCH: 30.9 pg (ref 26.0–34.0)
MCHC: 33.3 g/dL (ref 30.0–36.0)
MCV: 92.9 fL (ref 80.0–100.0)
Platelets: 154 10*3/uL (ref 150–400)
RBC: 4.5 MIL/uL (ref 4.22–5.81)
RDW: 12.3 % (ref 11.5–15.5)
WBC: 10.9 10*3/uL — ABNORMAL HIGH (ref 4.0–10.5)
nRBC: 0 % (ref 0.0–0.2)

## 2018-11-04 LAB — ECHOCARDIOGRAM COMPLETE
Height: 70 in
Weight: 4275.2 oz

## 2018-11-04 LAB — GLUCOSE, CAPILLARY: Glucose-Capillary: 158 mg/dL — ABNORMAL HIGH (ref 70–99)

## 2018-11-04 LAB — MAGNESIUM: Magnesium: 2 mg/dL (ref 1.7–2.4)

## 2018-11-04 MED ORDER — PERFLUTREN LIPID MICROSPHERE
1.0000 mL | INTRAVENOUS | Status: AC | PRN
  Administered 2018-11-04: 2 mL via INTRAVENOUS
  Filled 2018-11-04: qty 10

## 2018-11-04 MED ORDER — OXYCODONE-ACETAMINOPHEN 5-325 MG PO TABS: 1.0000 | ORAL_TABLET | ORAL | 0 refills | Status: AC | PRN

## 2018-11-04 NOTE — Progress Notes (Signed)
CARDIAC REHAB PHASE I   PRE:  Rate/Rhythm: 74 SR    BP: sitting 135/55    SaO2: 97 RA  MODE:  Ambulation: 400 ft   POST:  Rate/Rhythm: 90 SR    BP: sitting 147/54     SaO2: 96 RA  Pt needed mod-max assist to sit up from lying position. Also on return, needed mod-max to lift his legs into bed. Sts his wife will help him at home. He did c/o right abdomen pain with getting legs into bed. No pain at rest. Ambulated with RW. He would like RW for d/c as he feels steadier and has more stamina with it. He sts in the fall he was walking 3 miles a day. I gave him walking guidelines and encouraged him to build back up to his 3 miles. Discussed restrictions and also diet. His A1C has been up for a while so encouraged DM diet and exercise. Will refer to [facility].  [place], ACSM 11/04/2018 11:26 AM

## 2018-11-04 NOTE — Plan of Care (Signed)

## 2018-11-04 NOTE — Discharge Summary (Signed)
Physician Discharge Summary  Patient ID: JAXAN MICHEL MRN: [id number] DOB/AGE: 08/11/1945 73 y.o.  Admit date: 11/03/2018 Discharge date: 11/04/2018  Admission Diagnoses: Severe aortic stenosis Hypertension Dyslipidemia History of stroke Seizure disorder Obstructive sleep apnea Obesity  Discharge Diagnoses:   Severe aortic stenosis Hypertension Dyslipidemia History of stroke Seizure disorder Obstructive sleep apnea Obesity  Discharged Condition: stable  History of Present Illness: Patient is 73 year old obese male with history of aortic stenosis, previous stroke, hypertension, hyperlipidemia, obstructive sleep apnea on CPAP, and skin cancer who has been referred for surgical consultation to discuss treatment options for management of severe symptomatic aortic stenosis.  Patient states that he was first noted to have a heart murmur on physical exam performed by nurse during screening evaluation for his insurance in July 2019. In September 2019 the patient was admitted with an acute stroke. Symptoms were primarily related to expressive aphasia. He was diagnosed with a small acute infarct in the left MCA distribution. He was started on dual antiplatelet therapy and discharged from the hospital but readmitted within 48 hours with new neurologic symptoms and MRI consistent with propagation of previous stroke. He had a witnessed seizure and Keppra was started. During his hospitalization a transesophageal echocardiogram was performed to rule out source of embolization. The patient was noted to have moderate aortic stenosis. There was no clot in the left atrial appendage. There was no history of atrial fibrillation. Bubble study was negative. Patient was noted to have mild carotid Doppler disease. He was diagnosed with obstructive sleep apnea and started on CPAP. The patient has been followed intermittently ever since by Dr. Meda Coffee.  During follow-up telemedicine visit on Sep 17, 2018 the patient reported worsening fatigue and exertional shortness of breath with occasional vague chest pressure. Transthoracic echocardiogramperformed Sep 08, 2018 revealedpreserved left ventricular systolic function with moderate to severe aortic stenosis. Peak velocity across the aortic valve range between 3.8 and 4 m/s corresponding to mean transvalvular gradient estimated 39 mmHg. The DVI was reported 0.26 and aortic valve area calculated 1.25 cm. The patient was referred to the multidisciplinary heart valve clinic and evaluated by Dr. [name] Oct 01, 2018. Left and right heart catheterization performed October 21, 2018 confirmed the presence of severe aortic stenosis with peak to peak and mean transvalvular gradients measured 50 and 44.2 mmHg respectively. There was mild nonobstructive coronary artery disease and right heart pressures were normal. CT angiography was performed and the patient was referred for surgical consultation.  After review of the options for management, the decision was made to proceed with transcatheter aortic valve replacement.   Hospital Course:  Mr. Fero was admitted to the hospital for same-day surgery on 11/03/2018. He was brought to the operating room where TAVR was performed successfully by Dr. Roxy Manns and Dr. [name].  Please see the operative note below for details.  Following the procedure, he was transferred to 4 E. Progressive [facility].  He made a progressive and satisfactory recovery.  He was mobilized without difficulty.  Diet was advanced and well-tolerated. The vascular access sites were free of any complicating features and he had good distal perfusion. An echocardiogram was completed on post-op day 1 and showed satisfactory function of the prosthetic valve and no significant perivalvlar leak.  Consults: Cardiology, Dr. Lauree Chandler  Significant Diagnostic Studies:   Echo 09/08/18: 1. The left ventricle has normal systolic function with an ejection  fraction of 60-65%. The cavity size was normal. There is moderately increased left ventricular wall thickness. Left ventricular  diastolic Doppler parameters are consistent with impaired relaxation. 2. The right ventricle has normal systolic function. The cavity was normal. 3. The mitral valve is grossly normal. 4. The tricuspid valve is grossly normal. 5. The aortic valveis bicuspid. Moderate calcification of the aortic valve. Aortic valve regurgitation is trivial by color flow Doppler. Moderate-severe stenosis of the aortic valve. 6. There is mild dilatation of the ascending aorta and of the aortic root measuring 41 mm. 7. Normal LV function; moderate LVH; moderate diastolic dysfunction; mildly dilated aortic root; functionally bicuspid aortic valve with fusion or left and right cusps; moderate to severe AS (mean gradient 39 mmHg and peak velocity of 4 m/s).  FINDINGS Left Ventricle: The left ventricle has normal systolic function, with an ejection fraction of 60-65%. The cavity size was normal. There is moderately increased left ventricular wall thickness. Left ventricular diastolic Doppler parameters are consistent with impaired relaxation.  Right Ventricle: The right ventricle has normal systolic function. The cavity was normal.  Left Atrium: Left atrial size was normal in size.  Right Atrium: Right atrial size was normal in size. Right atrial pressure is estimated at 8 mmHg.  Interatrial Septum: No atrial level shunt detected by color flow Doppler.  Pericardium: There is no evidence of pericardial effusion.  Mitral Valve: The mitral valve is grossly normal. Mitral valve regurgitation is trivial by color flow Doppler.  Tricuspid Valve: The tricuspid valve is grossly normal. Tricuspid valve regurgitation is trivial by color flow Doppler.  Aortic Valve: The aortic valveis bicuspid Moderate calcification of the aortic valve. Aortic valve regurgitation is trivial by color  flow Doppler. There is Moderate-severe stenosis of the aortic valve, with a calculated valve area of 1.25 cm.  Pulmonic Valve: The pulmonic valve was grossly normal. Pulmonic valve regurgitation is trivial by color flow Doppler.  Aorta: There is mild dilatation of the ascending aorta and of the aortic root measuring 41 mm.  Venous: The inferior vena cava is normal in size with greater than 50% respiratory variability.   +--------------+--------++ LEFT VENTRICLE  +----------------+---------++ +--------------+--------++ Diastology   PLAX 2D   +----------------+---------++ +--------------+--------++ LV e' lateral: 4.13 cm/s LVIDd: 4.06 cm  +----------------+---------++ +--------------+--------++ LV E/e' lateral:19.0  LVIDs: 2.55 cm  +----------------+---------++ +--------------+--------++ LV e' medial: 5.98 cm/s LV PW: 1.76 cm  +----------------+---------++ +--------------+--------++ LV E/e' medial: 13.1  LV IVS: 1.50 cm  +----------------+---------++ +--------------+--------++ LVOT diam: 2.50 cm  +--------------+--------++ LV SV: 49 ml  +--------------+--------++ LV SV Index: 19.65  +--------------+--------++ LVOT Area: 4.91 cm +--------------+--------++    +--------------+--------++  +---------------+----------++ RIGHT VENTRICLE  +---------------+----------++ RV S prime: 15.90 cm/s +---------------+----------++ TAPSE (M-mode):2.4 cm  +---------------+----------++  +---------------+-------++-----------++ LEFT ATRIUM  Index  +---------------+-------++-----------++ LA diam: 5.00 cm2.12 cm/m  +---------------+-------++-----------++ LA Vol (A2C): 57.7 ml24.44 ml/m +---------------+-------++-----------++ LA Vol (A4C): 64.2  ml27.19 ml/m +---------------+-------++-----------++ LA Biplane Vol:61.2 ml25.92 ml/m +---------------+-------++-----------++ +------------+---------++-----------++ RIGHT ATRIUM Index  +------------+---------++-----------++ RA Area: 14.60 cm  +------------+---------++-----------++ RA Volume: 33.50 ml 14.19 ml/m +------------+---------++-----------++ +------------------+------------++ AORTIC VALVE   +------------------+------------++ AV Area (Vmax): 1.39 cm  +------------------+------------++ AV Area (Vmean): 1.31 cm  +------------------+------------++ AV Area (VTI): 1.25 cm  +------------------+------------++ AV Vmax: 384.60 cm/s  +------------------+------------++ AV Vmean: 245.800 cm/s +------------------+------------++ AV VTI: 0.945 m  +------------------+------------++ AV Peak Grad: 59.2 mmHg  +------------------+------------++ AV Mean Grad: 39.0 mmHg  +------------------+------------++ LVOT Vmax: 109.00 cm/s  +------------------+------------++ LVOT Vmean: 65.800 cm/s  +------------------+------------++ LVOT VTI: 0.241 m  +------------------+------------++ LVOT/AV VTI ratio:0.26  +------------------+------------++  +-------------+-------++ AORTA   +-------------+-------++ Ao Root diam:4.10 cm +-------------+-------++ Ao Asc  diam: 3.80 cm +-------------+-------++  +--------------+--------++ MITRAL VALVE   +--------------+-------+ +--------------+--------++ SHUNTS   MV Area (PHT):cm  +--------------+-------+ +--------------+--------++ Systemic VTI: 0.24 m  MV PHT: msec  +--------------+-------+ +--------------+--------++ Systemic Diam:2.50 cm MV  Decel Time:263 msec +--------------+-------+ +--------------+--------++ +--------------+----------++ MV E velocity:78.55 cm/s +--------------+----------++ MV A velocity:99.75 cm/s +--------------+----------++ MV E/A ratio: 0.79  +--------------+----------++   Recent Labs: 01/29/2018: TSH 0.503 02/02/2018: BUN 11; Creatinine, Ser 0.89; Hemoglobin 15.3; Platelets 155; Potassium 3.9; Sodium 137 04/06/2018: ALT 19       Wt Readings from Last 3 Encounters:  10/01/18 271 lb 12.8 oz (123.3 kg)  09/17/18 268 lb (121.6 kg)  06/04/18 267 lb 3.2 oz (121.2 kg)   RIGHT/LEFT HEART CATH AND CORONARY ANGIOGRAPHY  Conclusion    Mid RCA lesion is 40% stenosed.  Ost Cx lesion is 20% stenosed.  Mid Cx lesion is 20% stenosed.  Dist LAD lesion is 20% stenosed.   1. Mild non-obstructive CAD 2. Severe aortic stenosis (mean gradient 44.2 mmHg, peak to peak gradient 50 mmHg)  Recommendations: Will continue workup for TAVR.       Treatments:  TAVR OPERATIVE NOTE   Date of Procedure:                11/03/2018  Preoperative Diagnosis:      Severe Aortic Stenosis   Postoperative Diagnosis:    Same   Procedure:        Transcatheter Aortic Valve Replacement - Percutaneous Right Transfemoral Approach             Edwards Sapien 3 THV (size 26 mm, model # 9600TFX, serial # [id number])              Co-Surgeons:                        Valentina Gu. Roxy Manns, MD and Lauree Chandler, MD  Anesthesiologist:                  Adele Barthel, MD  Echocardiographer:              Sanda Klein, MD  Pre-operative Echo Findings: ? Severe aortic stenosis ? Normal left ventricular systolic function  Post-operative Echo Findings: ? Trace paravalvular leak ? Normal left ventricular systolic function   BRIEF CLINICAL NOTE AND INDICATIONS FOR SURGERY  Patient is 73 year old obese male with history of aortic stenosis, previous stroke, hypertension,  hyperlipidemia, obstructive sleep apnea on CPAP, and skin cancer who has been referred for surgical consultation to discuss treatment options for management of severe symptomatic aortic stenosis.  Patient states that he was first noted to have a heart murmur on physical exam performed by nurse during screening evaluation for his insurance in July 2019. In September 2019 the patient was admitted with an acute stroke. Symptoms were primarily related to expressive aphasia. He was diagnosed with a small acute infarct in the left MCA distribution. He was started on dual antiplatelet therapy and discharged from the hospital but readmitted within 48 hours with new neurologic symptoms and MRI consistent with propagation of previous stroke. He had a witnessed seizure and Keppra was started. During his hospitalization a transesophageal echocardiogram was performed to rule out source of embolization. The patient was noted to have moderate aortic stenosis. There was no clot in the left atrial appendage. There was no history of atrial fibrillation. Bubble study was negative. Patient was noted to have mild carotid Doppler disease. He was diagnosed with obstructive sleep apnea and  started on CPAP. The patient has been followed intermittently ever since by Dr. Meda Coffee.  During follow-up telemedicine visit on Sep 17, 2018 the patient reported worsening fatigue and exertional shortness of breath with occasional vague chest pressure. Transthoracic echocardiogramperformed Sep 08, 2018 revealedpreserved left ventricular systolic function with moderate to severe aortic stenosis. Peak velocity across the aortic valve range between 3.8 and 4 m/s corresponding to mean transvalvular gradient estimated 39 mmHg. The DVI was reported 0.26 and aortic valve area calculated 1.25 cm. The patient was referred to the multidisciplinary heart valve clinic and evaluated by Dr. [name] Oct 01, 2018. Left and right heart  catheterization performed October 21, 2018 confirmed the presence of severe aortic stenosis with peak to peak and mean transvalvular gradients measured 50 and 44.2 mmHg respectively. There was mild nonobstructive coronary artery disease and right heart pressures were normal. CT angiography was performed and the patient was referred for surgical consultation.   Discharge Exam: Blood pressure (!) 119/59, pulse 73, temperature 98.4 F (36.9 C), temperature source Oral, resp. rate 20, height 5\' 10"  (1.778 m), weight 121.2 kg, SpO2 92 %.  Physical Exam:             Rhythm:                       sinus             Breath sounds:            clear             Heart sounds:              RRR w/out murmur             Incisions:                     Both groins look good             Abdomen:                    Soft, non-tender             Extremities:                 Warm, well-perfused  Disposition:   Discharge Instructions    Amb Referral to Cardiac Rehabilitation   Complete by: As directed    To [place] [facility]   Diagnosis: Valve Replacement   Valve: Aortic Comment - TAVR   After initial evaluation and assessments completed: Virtual Based Care may be provided alone or in conjunction with Phase 2 Cardiac Rehab based on patient barriers.: Yes     Allergies as of 11/04/2018   No Known Allergies     Medication List    STOP taking these medications   acetaminophen 500 MG tablet Commonly known as: TYLENOL   HYDROcodone-acetaminophen 5-325 MG tablet Commonly known as: NORCO/VICODIN     TAKE these medications   aspirin 81 MG EC tablet Take 1 tablet (81 mg total) by mouth daily.   atorvastatin 80 MG tablet Commonly known as: LIPITOR Take 1 tablet (80 mg total) by mouth daily at 6 PM.   ciprofloxacin 500 MG tablet Commonly known as: CIPRO Take 500 mg by mouth 2 (two) times a day.   clopidogrel 75 MG tablet Commonly known as: PLAVIX Take 1 tablet (75 mg total) by mouth daily.   doxazosin 4  MG tablet Commonly known as: CARDURA Take 1 tablet (4  mg total) by mouth at bedtime.   dutasteride 0.5 MG capsule Commonly known as: AVODART Take 0.5 mg by mouth daily.   Kaolin Powd Place 1 application rectally as needed (hemorroids (apply after bathing and bowel movements)). PRANICURA- glycerin, kaolin, calamine, menthol cream   levETIRAcetam 500 MG tablet Commonly known as: KEPPRA Take 250 mg by mouth daily.   losartan 50 MG tablet Commonly known as: COZAAR Take 1 tablet (50 mg total) by mouth daily.   oxyCODONE-acetaminophen 5-325 MG tablet Commonly known as: Percocet Take 1 tablet by mouth every 4 (four) hours as needed for up to 3 days for severe pain.   RectiCare 5 % Crea Generic drug: Lidocaine (Anorectal) Place 1 application rectally 2 (two) times daily as needed (hemmorroids.).   tetrahydrozoline 0.05 % ophthalmic solution Place 1-2 drops into both eyes 3 (three) times daily as needed (dry/irritated eyes.).      Follow-up Information    Eileen Stanford, PA-C Follow up on 11/11/2018.   Specialties: Cardiology, Radiology Why: You have a follow up appointment scheduled with [name], PA-C on Wednesday, July 8,2020 at [facility] information: [address] [place] [address] [phone]           Signed: Antony Odea, PA-C 11/04/2018, 1:52 PM

## 2018-11-04 NOTE — Progress Notes (Addendum)
      [address]       [place] [address]             [phone]     CARDIOTHORACIC SURGERY PROGRESS NOTE  1 Day Post-Op  S/P Procedure(s) (LRB): TRANSCATHETER AORTIC VALVE REPLACEMENT, TRANSFEMORAL (N/A) TRANSESOPHAGEAL ECHOCARDIOGRAM (TEE) (N/A)  Subjective: Feels okay.  Got cold overnight.  Some GI upset, which is a chronic problem.  No chest pain.  No SOB  Objective: Vital signs in last 24 hours: Temp:  [97.1 F (36.2 C)-99.3 F (37.4 C)] 99.3 F (37.4 C) (07/01 0408) Pulse Rate:  [0-295] 89 (07/01 0414) Cardiac Rhythm: Normal sinus rhythm (07/01 0414) Resp:  [0-20] 20 (06/30 1928) BP: (104-167)/(52-83) 142/67 (07/01 0414) SpO2:  [0 %-98 %] 94 % (07/01 0414) Weight:  [121.2 kg-123 kg] 121.2 kg (07/01 0408)  Physical Exam:  Rhythm:   sinus  Breath sounds: clear  Heart sounds:  RRR w/out murmur  Incisions:  Both groins look good  Abdomen:  Soft, non-tender  Extremities:  Warm, well-perfused   Intake/Output from previous day: 06/30 0701 - 07/01 0700 In: 1781.6 [P.O.:600; I.V.:1081.6; IV Piggyback:100] Out: 725 [Urine:700; Blood:25] Intake/Output this shift: No intake/output data recorded.  Lab Results: Recent Labs    11/03/18 1536 11/04/18 0431  WBC  --  10.9*  HGB 14.6 13.9  HCT 43.0 41.8  PLT  --  154   BMET:  Recent Labs    11/03/18 1536 11/04/18 0431  NA 139 138  K 4.3 3.9  CL 103 103  CO2  --  25  GLUCOSE 165* 172*  BUN 10 10  CREATININE 0.80 1.01  CALCIUM  --  8.3*    CBG (last 3)  Recent Labs    11/03/18 2041 11/03/18 2334 11/04/18 0417  GLUCAP 211* 131* 158*   PT/INR:  No results for input(s): LABPROT, INR in the last 72 hours.  CXR:  PORTABLE CHEST 1 VIEW  COMPARISON:  Preoperative study 10/30/2018 and earlier.  FINDINGS: Portable AP upright view at 1858 hours. Stable cardiac size and mediastinal contours. Sequelae of TAVR now identified. Visualized tracheal air column is within normal limits.  Allowing for portable technique the lungs are clear. No pneumothorax or pleural effusion. Partially visible cervical ACDF. No acute osseous abnormality identified.  IMPRESSION: Status post TAVR.  No acute cardiopulmonary abnormality.   Electronically Signed   By: Genevie Ann M.D.   On: 11/03/2018 19:22   EKG: NSR w/out significant AV conduction delay    Assessment/Plan: S/P Procedure(s) (LRB): TRANSCATHETER AORTIC VALVE REPLACEMENT, TRANSFEMORAL (N/A) TRANSESOPHAGEAL ECHOCARDIOGRAM (TEE) (N/A)  Doing very well POD1 TAVR Maintaining NSR w/ no AV conduction delay Breathing comfortably on room air Essential hypertension Chronic diastolic CHF History of stroke Morbid obesity OSA on CPAP at night Irritable bowel syndrome Limited mobility   Mobilize  Resume DAPT and home meds for hypertension  Routine ECHO this morning  Possible d/c home this afternoon or tomorrow  I spent in excess of 15 minutes during the conduct of this hospital encounter and >50% of this time involved direct face-to-face encounter with the patient for counseling and/or coordination of their care.      Rexene Alberts, MD 11/04/2018 7:47 AM

## 2018-11-04 NOTE — Progress Notes (Signed)
  Echocardiogram 2D Echocardiogram has been performed.  [name] 11/04/2018, 9:33 AM

## 2018-11-04 NOTE — Discharge Instructions (Signed)
Transcatheter Aortic Valve Replacement, Care After This sheet gives you information about how to care for yourself after your procedure. Your health care provider may also give you more specific instructions. If you have problems or questions, contact your health care provider. What can I expect after the procedure? After the procedure, it is common to have:  Pain around your incision areas.  Bruising and a small lump near your catheter insertion area as it heals. Follow these instructions at home: Eating and drinking      Follow instructions from your health care provider about eating or drinking restrictions. ? Limit alcohol intake to no more than 1 drink per day for nonpregnant women and 2 drinks per day for men. One drink equals 12 oz of beer, 5 oz of wine, or 1 oz of hard liquor. ? Limit how much caffeine you drink. Caffeine can affect your heart's rate and rhythm.  Eat a heart-healthy diet. This includes low-fat (lean) proteins, whole grains, and plenty of fresh fruits and vegetables. Avoid foods that are: ? High in salt (sodium), saturated fat, or sugar. Check ingredients and nutrition facts on packaged foods and beverages. ? Canned or highly processed. ? Fried.  Drink enough fluid to keep your urine pale yellow. Activity  Take naps and rest as needed throughout the day.  Return to your normal activities as told by your health care provider. Ask your health care provider what activities are safe for you.  Exercise regularly, as told by your health care provider.  Be sure to get up and move around one or more times every 1-2 hours. This helps to prevent blood clots.  Avoid climbing stairs for about 1 week.  Do not lift anything that is heavier than 10 lb (4.5 kg) until your health care provider approves. Incision care   Follow instructions from your health care provider about how to take care of your incisions. Make sure you: ? Wash your hands with soap and water  before you change your bandages (dressings). If soap and water are not available, use hand sanitizer. ? Change your dressings as told by your health care provider. ? Leave stitches (sutures) or adhesive strips in place. These skin closures may need to stay in place for 2 weeks or longer. If adhesive strip edges start to loosen and curl up, you may trim the loose edges. Do not remove adhesive strips completely unless your health care provider tells you to do that.  Check your incision areas every day for signs of infection. Check for: ? Redness, swelling, or pain. ? Fluid or blood. ? Warmth. ? Pus or a bad smell. Medicines  Take over-the-counter and prescription medicines only as told by your health care provider.  If you were prescribed an antibiotic medicine, take it as told by your health care provider. Do not stop taking the antibiotic even if you start to feel better. Travel  Avoid airplane travel for as long as told by your health care provider.  When you travel, bring a list of your medicines and a record of your medical history with you. Driving  Ask your health care provider when it is safe for you to drive. Do not drive until your health care provider approves.  Do not drive for 24 hours if you were given a medicine to help you relax (sedative) during your procedure.  Do not drive or use heavy machinery while taking prescription pain medicine, unless your health care provider approves. Lifestyle  Do not use  any products that contain nicotine or tobacco, such as cigarettes and e-cigarettes. If you need help quitting, ask your health care provider.  Resume sexual activity as told by your health care provider. Do not use medicines for erectile dysfunction unless your health care provider approves, if this applies.  Work with your health care provider to keep your blood pressure under control and to manage any other heart conditions that you have.  Maintain a healthy  weight. General instructions  Do not take baths, swim, or use a hot tub until your health care provider approves.  Do not strain to have a bowel movement.  To prevent or treat constipation while you are taking prescription pain medicine, your health care provider may recommend that you: ? Take over-the-counter or prescription medicines. ? Eat foods that are high in fiber, such as fresh fruits and vegetables, whole grains, and beans. ? Limit foods that are high in fat and processed sugars, such as fried and sweet foods.  Wear compression stockings as told by your health care provider. These stockings help to prevent blood clots and reduce swelling in your legs.  Tell all of your health care providers that you have an artificial aortic valve. If you have or have had heart disease or endocarditis, tell all health care providers about these conditions as well.  Keep all follow-up visits as told by your health care provider. This is important. Contact a health care provider if:  You develop a skin rash.  You experience sudden, unexplained weight changes.  You have redness, swelling, or pain around an incision area.  Your incision feels warm to the touch.  You have pus or a bad smell coming from an incision area.  You have a fever. Get help right away if:  Your incision area swells very quickly.  Your incision is bleeding, and the bleeding does not stop after holding pressure on the area.  The area near or just beyond your incision tingles or becomes pale, cool, or numb.  You develop chest pain.  You develop shortness of breath or have difficulty breathing. These symptoms may represent a serious problem that is an emergency. Do not wait to see if the symptoms will go away. Get medical help right away. Call your local emergency services (911 in the [place] Do not drive yourself to the hospital. Summary  After the procedure, it is common to have discomfort and bruising around the  catheter insertion site.  Avoid climbing stairs for about 1 week after your procedure.  Do not lift anything that is heavier than 10 lb (4.5 kg) until your health care provider approves.  Tell all of your health care providers that you have an artificial aortic valve. This information is not intended to replace advice given to you by your health care provider. Make sure you discuss any questions you have with your health care provider. Document Released: 03/11/2016 Document Revised: 08/12/2018 Document Reviewed: 03/11/2016 Elsevier Patient Education  2020 [employer].

## 2018-11-05 ENCOUNTER — Telehealth: Payer: Self-pay

## 2018-11-05 NOTE — Telephone Encounter (Signed)
Patient contacted regarding discharge from [facility] on 11/04/2018.  Patient understands to follow up with provider [name] PA-C on 11/11/2018 at 2:00 PM at [facility] office. Patient understands discharge instructions? yes Patient understands medications and regiment? yes Patient understands to bring all medications to this visit? yes   The pt is feeling well this morning. He does complain of some soreness in his side and he states that the MD told him this is most likely related to the pressure used to hold groin site.  I advised the pt to contact the office with any worsening pain, bleeding or swelling at site.  The pt also asked if he can take Miralax after surgery to help with constipation.  I advised that he is okay to continue this regimen. The pt was instructed to call [phone] will any questions or concerns. Pt agreed with plan.

## 2018-11-07 NOTE — Progress Notes (Signed)
[facility]                                       Cardiology Office Note    Date:  11/11/2018   ID:  [name], DOB 1945-08-02, MRN [id number]  PCP:  Cyndi Bender, PA-C  Cardiologist:  Dr. Meda Coffee / Dr. [name] & Dr. Roxy Manns (TAVR)  CC: [facility] s/p TAVR  History of Present Illness:  [name] is a 73 y.o. male with a history of previous stroke, HTN, HLD, OSA on CPAP, seizures, skin cancer and severe AS s/p TAVR (11/03/18) who presents to clinic for follow up.   Patient states that he was first noted to have a heart murmur on physical exam performed by nurse during screening evaluation for his insurance in July 2019. In 01/2018 the patient was admitted with an acute stroke. Symptoms were primarily related to expressive aphasia. He was diagnosed with a small acute infarct in the left MCA distribution. He was started on dual antiplatelet therapy and discharged from the hospital but readmitted within 48 hours with new neurologic symptoms and MRI consistent with propagation of previous stroke. He had a witnessed seizure and Keppra was started. During his hospitalization a transesophageal echocardiogram was performed to rule out source of embolization. The patient was noted to have moderate aortic stenosis. There was no clot in the left atrial appendage. There was no history of atrial fibrillation. Bubble study was negative. Patient was noted to have mild carotid Doppler disease. He was diagnosed with obstructive sleep apnea and started on CPAP. The patient has been followed intermittently ever since by Dr. Meda Coffee.  During follow-up telemedicine visit on Sep 17, 2018 the patient reported worsening fatigue and exertional shortness of breath with occasional vague chest pressure. Transthoracic echocardiogramperformed Sep 08, 2018 revealedpreserved left ventricular systolic function with moderate to severe aortic stenosis Peak velocity across  the aortic valve range between 3.8 and 4 m/s corresponding to mean transvalvular gradient estimated 39 mmHg. The DVI was reported 0.26 and aortic valve area calculated 1.25 cm. The patient was referred to the multidisciplinary heart valve clinic.Left and right heart catheterization performed October 21, 2018 confirmed the presence of severe aortic stenosis with peak to peak and mean transvalvular gradients measured 50 and 44.2 mmHg respectively. There was mild nonobstructive coronary artery disease and right heart pressures were normal.  The patient was evaluated by the multidisciplinary valve team and felt to have severe, symptomatic aortic stenosis. He underwent successful TAVR with a 26 mm Edwards Sapien 3 THV via the TF approach on 11/03/18. Post op echo showed EF 65% with normally functioning TAVR with mean gradient moderately elevated at 28 mm Hg and trivial PVL. He was discharged on POD 1 on Aspirin and plavix.   Today he presents follow up. He is feeling well. No CP or SOB. No LE edema, orthopnea or PND. No dizziness or syncope. No blood in stool or urine. No palpitations.   Past Medical History:  Diagnosis Date  . Aortic stenosis, severe   . Arthritis    "hands" (02/04/2018)  . Basal cell carcinoma    "MOHS surgery on sides of my nose" (02/04/2018)  . Chronic lower back pain   . CVA (cerebral vascular accident) ([place]) 01/29/2018  . CVA (cerebral vascular accident) ([place]) 02/01/2018  . Diarrhea   . Difficulty urinating   .  Heart murmur    no problems  . High cholesterol   . Hypertension   . OSA on CPAP    "just got CPAP today" (02/04/2018)  . Rectal bleeding   . Rectal pain   . S/P TAVR (transcatheter aortic valve replacement) 11/03/2018   26 mm Edwards Sapien 3 transcatheter heart valve placed via percutaneous right transfemoral approach   . Stroke [facility])    "we were told that MRI showed previous stroke we were not aware that he had" (02/04/2018)    Past Surgical History:   Procedure Laterality Date  . ANTERIOR CERVICAL DECOMP/DISCECTOMY FUSION  2009  . BACK SURGERY  1980's  . COLONOSCOPY     x 2  . EXCISIONAL HEMORRHOIDECTOMY  1989 -2011 X 2 or 3  . LAPAROSCOPIC CHOLECYSTECTOMY  2005  . LUMBAR DISC SURGERY  1980s X 2  . MOHS SURGERY Bilateral 2006   "sides of my nose" x 2  . RIGHT/LEFT HEART CATH AND CORONARY ANGIOGRAPHY N/A 10/21/2018   Procedure: RIGHT/LEFT HEART CATH AND CORONARY ANGIOGRAPHY;  Surgeon: Burnell Blanks, MD;  Location: [place] [facility];  Service: Cardiovascular;  Laterality: N/A;  . TEE WITHOUT CARDIOVERSION N/A 02/03/2018   Procedure: TRANSESOPHAGEAL ECHOCARDIOGRAM (TEE);  Surgeon: Lelon Perla, MD;  [facility] ENDOSCOPY;  Service: Cardiovascular;  Laterality: N/A;  . TEE WITHOUT CARDIOVERSION N/A 11/03/2018   Procedure: TRANSESOPHAGEAL ECHOCARDIOGRAM (TEE);  Surgeon: Burnell Blanks, MD;  Location: [place] [facility] LAB;  Service: Open Heart Surgery;  Laterality: N/A;  . TRANSCATHETER AORTIC VALVE REPLACEMENT, TRANSFEMORAL N/A 11/03/2018   Procedure: TRANSCATHETER AORTIC VALVE REPLACEMENT, TRANSFEMORAL;  Surgeon: Burnell Blanks, MD;  Location: [place] [facility];  Service: Open Heart Surgery;  Laterality: N/A;  . WISDOM TOOTH EXTRACTION      Current Medications: Outpatient Medications Prior to Visit  Medication Sig Dispense Refill  . aspirin EC 81 MG EC tablet Take 1 tablet (81 mg total) by mouth daily. 30 tablet 3  . atorvastatin (LIPITOR) 80 MG tablet Take 1 tablet (80 mg total) by mouth daily at 6 PM. 90 tablet 3  . clopidogrel (PLAVIX) 75 MG tablet Take 1 tablet (75 mg total) by mouth daily. 90 tablet 3  . doxazosin (CARDURA) 4 MG tablet Take 1 tablet (4 mg total) by mouth at bedtime. 90 tablet 3  . dutasteride (AVODART) 0.5 MG capsule Take 0.5 mg by mouth daily.  3  . Kaolin POWD Place 1 application rectally as needed (hemorroids (apply after bathing and bowel movements)). PRANICURA- glycerin, kaolin,  calamine, menthol cream    . Lidocaine, Anorectal, (RECTICARE) 5 % CREA Place 1 application rectally 2 (two) times daily as needed (hemmorroids.).     Marland Kitchen losartan (COZAAR) 50 MG tablet Take 1 tablet (50 mg total) by mouth daily. 90 tablet 2  . tetrahydrozoline 0.05 % ophthalmic solution Place 1-2 drops into both eyes 3 (three) times daily as needed (dry/irritated eyes.).    Marland Kitchen ciprofloxacin (CIPRO) 500 MG tablet Take 500 mg by mouth 2 (two) times a day.    . levETIRAcetam (KEPPRA) 500 MG tablet Take 250 mg by mouth daily.      No facility-administered medications prior to visit.      Allergies:   Patient has no known allergies.   Social History   Socioeconomic History  . Marital status: Married    Spouse name: Not on file  . Number of children: Not on file  . Years of education: Not on file  .  Highest education level: Not on file  Occupational History  . Occupation: retired  Scientific laboratory technician  . Financial resource strain: Not on file  . Food insecurity    Worry: Not on file    Inability: Not on file  . Transportation needs    Medical: Not on file    Non-medical: Not on file  Tobacco Use  . Smoking status: Never Smoker  . Smokeless tobacco: Never Used  Substance and Sexual Activity  . Alcohol use: Not Currently    Alcohol/week: 0.0 standard drinks  . Drug use: Not Currently  . Sexual activity: Not Currently  Lifestyle  . Physical activity    Days per week: Not on file    Minutes per session: Not on file  . Stress: Not on file  Relationships  . Social Herbalist on phone: Not on file    Gets together: Not on file    Attends religious service: Not on file    Active member of club or organization: Not on file    Attends meetings of clubs or organizations: Not on file    Relationship status: Not on file  Other Topics Concern  . Not on file  Social History Narrative   Lives with his wife in [place], [place]     Family History:  The patient's family history includes  Cancer in his mother; Heart disease in his father.      ROS:   Please see the history of present illness.    ROS All other systems reviewed and are negative.   PHYSICAL EXAM:   VS:  BP 130/72   Pulse 69   Ht 5\' 10"  (1.778 m)   Wt 265 lb 12.8 oz (120.6 kg)   SpO2 97%   BMI 38.14 kg/m    GEN: Well nourished, well developed, in no acute distress HEENT: normal Neck: no JVD or masses Cardiac: RRR; no murmurs, rubs, or gallops,no edema  Respiratory:  clear to auscultation bilaterally, normal work of breathing GI: soft, nontender, nondistended, + BS MS: no deformity or atrophy Skin: warm and dry, no rash.  Groin sites clear without hematoma or ecchymosis  Neuro:  Alert and Oriented x 3, Strength and sensation are intact Psych: euthymic mood, full affect   Wt Readings from Last 3 Encounters:  11/11/18 265 lb 12.8 oz (120.6 kg)  11/04/18 267 lb 3.2 oz (121.2 kg)  10/30/18 270 lb 4.8 oz (122.6 kg)      Studies/Labs Reviewed:   EKG:  EKG is ordered today.  The ekg ordered today demonstrates NSR HR 69  Recent Labs: 01/29/2018: TSH 0.503 10/30/2018: ALT 24; B Natriuretic Peptide 36.0 11/04/2018: BUN 10; Creatinine, Ser 1.01; Hemoglobin 13.9; Magnesium 2.0; Platelets 154; Potassium 3.9; Sodium 138   Lipid Panel    Component Value Date/Time   CHOL 118 04/06/2018 0813   TRIG 175 (H) 04/06/2018 0813   HDL 38 (L) 04/06/2018 0813   CHOLHDL 3.1 04/06/2018 0813   CHOLHDL 6.9 01/30/2018 0347   VLDL 64 (H) 01/30/2018 0347   LDLCALC 45 04/06/2018 0813    Additional studies/ records that were reviewed today include:  TAVR OPERATIVE NOTE   Date of Procedure:11/03/2018  Preoperative Diagnosis:Severe Aortic Stenosis   Postoperative Diagnosis:Same   Procedure:   Transcatheter Aortic Valve Replacement - PercutaneousRightTransfemoral Approach Edwards Sapien 3 THV (size 75mm, model # 9600TFX, serial #  [id number])  Co-Surgeons:Clarence H. Roxy Manns, MD and Lauree Chandler, MD  Anesthesiologist:Ryan Roanna Banning, MD  Echocardiographer:Mihai Croitoru, MD  Pre-operative Echo Findings: ? Severe aortic stenosis ? Normalleft ventricular systolic function  Post-operative Echo Findings: ? Traceparavalvular leak ? Normalleft ventricular systolic function   ____________________   Echo 11/04/18 IMPRESSIONS  1. The left ventricle has hyperdynamic systolic function, with an ejection fraction of >65%. The cavity size was normal. There is severely increased left ventricular wall thickness. Left ventricular diastolic Doppler parameters are consistent with  impaired relaxation. No evidence of left ventricular regional wall motion abnormalities.  2. The right ventricle has normal systolic function. The cavity was normal. There is no increase in right ventricular wall thickness.  3. Trivial pericardial effusion is present.  4. There is mild mitral annular calcification present. No evidence of mitral valve stenosis. No significant mitral regurgitation.  5. Bioprosthetic aortic valve s/p TAVR. There is trivial peri-valvular regurgitation. The mean gradient across the valve is moderately elevated at 28 mmHg. AVA 2.5 cm^2. ?High output/high flow as cause of elevated gradient, will need repeat echo in  future.  6. The aortic root is normal in size and structure.  7. Normal IVC size. PA systolic pressure 16 mmHg.   ASSESSMENT & PLAN:   Severe AS s/p TAVR: doing well. Groin sites are healing well. ECG with sinus and no HAVB. Continue Asprin and plavix. SBE prophylaxis discussed; I have RX'd amoxicillin. Post op echo showed EF 65% with normally functioning TAVR with mean gradient moderately elevated at 28 mm Hg and trivial PVL. Will follow gradients at 1 month echo.   HTN: BP well controlled today. Continue current regimen  HLD:  continue statin   Hx of CVA: continue aspirin and plavix.  Pulmonary nodule: patient is a never smoker and low risk. No follow up recommended .  Medication Adjustments/Labs and Tests Ordered: Current medicines are reviewed at length with the patient today.  Concerns regarding medicines are outlined above.  Medication changes, Labs and Tests ordered today are listed in the Patient Instructions below. Patient Instructions  Medication Instructions:  Your provider discussed the importance of taking an antibiotic prior to all dental visits to prevent damage to the heart valves from infection. You were given a prescription for AMOXIL 2,000 mg to take one hour prior to any dental appointment.   Labwork: None  Testing/Procedures: Your provider has requested that you have an echocardiogram. Echocardiography is a painless test that uses sound waves to create images of your heart. It provides your doctor with information about the size and shape of your heart and how well your heart's chambers and valves are working. This procedure takes approximately one hour. There are no restrictions for this procedure. You are scheduled for your echo on 11/25/2018 at 10:30AM. Please arrive 15 minutes prior to your appointment for check-in.    Follow-Up: You have a virtual visit scheduled with Nell Range on 11/26/2018 at 3:00PM.     Signed, [name], PA-C  11/11/2018 3:37 PM    [address], [place], [place]  [address] Phone: [phone]; Fax: [phone]

## 2018-11-11 ENCOUNTER — Telehealth: Payer: Self-pay | Admitting: Physician Assistant

## 2018-11-11 ENCOUNTER — Ambulatory Visit (INDEPENDENT_AMBULATORY_CARE_PROVIDER_SITE_OTHER): Payer: Medicare Other | Admitting: Physician Assistant

## 2018-11-11 ENCOUNTER — Other Ambulatory Visit: Payer: Self-pay

## 2018-11-11 ENCOUNTER — Encounter: Payer: Self-pay | Admitting: Physician Assistant

## 2018-11-11 VITALS — BP 130/72 | HR 69 | Ht 70.0 in | Wt 265.8 lb

## 2018-11-11 DIAGNOSIS — Z8673 Personal history of transient ischemic attack (TIA), and cerebral infarction without residual deficits: Secondary | ICD-10-CM | POA: Diagnosis not present

## 2018-11-11 DIAGNOSIS — I10 Essential (primary) hypertension: Secondary | ICD-10-CM

## 2018-11-11 DIAGNOSIS — Z952 Presence of prosthetic heart valve: Secondary | ICD-10-CM | POA: Diagnosis not present

## 2018-11-11 DIAGNOSIS — E782 Mixed hyperlipidemia: Secondary | ICD-10-CM

## 2018-11-11 DIAGNOSIS — R911 Solitary pulmonary nodule: Secondary | ICD-10-CM

## 2018-11-11 MED ORDER — AMOXICILLIN 500 MG PO TABS: ORAL_TABLET | ORAL | 8 refills | Status: DC

## 2018-11-11 NOTE — Patient Instructions (Signed)
Medication Instructions:  Your provider discussed the importance of taking an antibiotic prior to all dental visits to prevent damage to the heart valves from infection. You were given a prescription for AMOXIL 2,000 mg to take one hour prior to any dental appointment.   Labwork: None  Testing/Procedures: Your provider has requested that you have an echocardiogram. Echocardiography is a painless test that uses sound waves to create images of your heart. It provides your doctor with information about the size and shape of your heart and how well your heart's chambers and valves are working. This procedure takes approximately one hour. There are no restrictions for this procedure. You are scheduled for your echo on 11/25/2018 at 10:30AM. Please arrive 15 minutes prior to your appointment for check-in.    Follow-Up: You have a virtual visit scheduled with Nell Range on 11/26/2018 at 3:00PM.

## 2018-11-11 NOTE — Telephone Encounter (Signed)
New Message ° ° ° °Left message to confirm appt and answer covid questions  °

## 2018-11-11 NOTE — Telephone Encounter (Signed)
Spoke with pt and he answered no to all prescreening questions and is aware of new in office protocol.       COVID-19 Pre-Screening Questions:  . In the past 7 to 10 days have you had a cough,  shortness of breath, headache, congestion, fever (100 or greater) body aches, chills, sore throat, or sudden loss of taste or sense of smell? NO . Have you been around anyone with known Covid 19? NO . Have you been around anyone who is awaiting Covid 19 test results in the past 7 to 10 days? NO . Have you been around anyone who has been exposed to Covid 19, or has mentioned symptoms of Covid 19 within the past 7 to 10 days? NO  If you have any concerns/questions about symptoms patients report during screening (either on the phone or at [facility]). Contact the provider seeing the patient or DOD for further guidance.  If neither are available contact a member of the leadership team.

## 2018-11-24 ENCOUNTER — Other Ambulatory Visit: Payer: Self-pay

## 2018-11-24 DIAGNOSIS — Z952 Presence of prosthetic heart valve: Secondary | ICD-10-CM

## 2018-11-25 ENCOUNTER — Other Ambulatory Visit: Payer: Self-pay

## 2018-11-25 ENCOUNTER — Ambulatory Visit (HOSPITAL_COMMUNITY): Payer: Medicare Other | Attending: Cardiovascular Disease

## 2018-11-25 DIAGNOSIS — I350 Nonrheumatic aortic (valve) stenosis: Secondary | ICD-10-CM

## 2018-11-25 DIAGNOSIS — Z952 Presence of prosthetic heart valve: Secondary | ICD-10-CM

## 2018-11-26 ENCOUNTER — Telehealth (INDEPENDENT_AMBULATORY_CARE_PROVIDER_SITE_OTHER): Payer: Medicare Other | Admitting: Physician Assistant

## 2018-11-26 VITALS — BP 162/69 | HR 69 | Ht 70.0 in | Wt 260.0 lb

## 2018-11-26 DIAGNOSIS — Z8673 Personal history of transient ischemic attack (TIA), and cerebral infarction without residual deficits: Secondary | ICD-10-CM | POA: Diagnosis not present

## 2018-11-26 DIAGNOSIS — Z952 Presence of prosthetic heart valve: Secondary | ICD-10-CM | POA: Diagnosis not present

## 2018-11-26 DIAGNOSIS — I10 Essential (primary) hypertension: Secondary | ICD-10-CM | POA: Diagnosis not present

## 2018-11-26 DIAGNOSIS — E782 Mixed hyperlipidemia: Secondary | ICD-10-CM

## 2018-11-26 DIAGNOSIS — R911 Solitary pulmonary nodule: Secondary | ICD-10-CM

## 2018-11-26 MED ORDER — CARVEDILOL 3.125 MG PO TABS: 3.1250 mg | ORAL_TABLET | Freq: Two times a day (BID) | ORAL | 6 refills | Status: DC

## 2018-11-26 NOTE — Progress Notes (Signed)
[facility]   MULTIDISCIPLINARY HEART VALVE TEAM    Virtual Visit via Video Note   This visit type was conducted due to national recommendations for restrictions regarding the COVID-19 Pandemic (e.g. social distancing) in an effort to limit this patient's exposure and mitigate transmission in our community.  Due to his co-morbid illnesses, this patient is at least at moderate risk for complications without adequate follow up.  This format is felt to be most appropriate for this patient at this time.  All issues noted in this document were discussed and addressed.  A limited physical exam was performed with this format.  Please refer to the patient's chart for his consent to telehealth for [facility].   Evaluation Performed:  Follow-up visit  Date:  11/26/2018   ID:  [name], DOB 26-Jun-1945, MRN [id number]  Patient Location: Home Provider Location: Office  PCP:  Cyndi Bender, PA-C  Cardiologist:  Ena Dawley, MD / Dr. [name] & Dr. Roxy Manns (TAVR)  Chief Complaint:  1 month s/p TAVR   History of Present Illness:    [name] is a 73 y.o. male with a history of previous stroke, HTN, HLD, OSA on CPAP, seizures, skin cancer and severe AS s/p TAVR (11/03/18) who presents for follow up.   The patient does not have symptoms concerning for COVID-19 infection (fever, chills, cough, or new shortness of breath).   Patient states that he was first noted to have a heart murmur on physical exam performed by nurse during screening evaluation for his insurance in July 2019. In 01/2018 the patient was admitted with an acute stroke. Symptoms were primarily related to expressive aphasia. He was diagnosed with a small acute infarct in the left MCA distribution. He was started on dual antiplatelet therapy and discharged from the hospital but readmitted within 48 hours with new neurologic symptoms and MRI consistent with propagation of previous stroke. He had a witnessed seizure  and Keppra was started. During his hospitalization a transesophageal echocardiogram was performed to rule out source of embolization. The patient was noted to have moderate aortic stenosis. There was no clot in the left atrial appendage. There was no history of atrial fibrillation. Bubble study was negative. Patient was noted to have mild carotid Doppler disease. He was diagnosed with obstructive sleep apnea and started on CPAP. The patient has been followed intermittently ever since by Dr. Meda Coffee.  During follow-up telemedicine visit on Sep 17, 2018 the patient reported worsening fatigue and exertional shortness of breath with occasional vague chest pressure. Transthoracic echocardiogramperformed Sep 08, 2018 revealedpreserved left ventricular systolic function with moderate to severe aortic stenosis Peak velocity across the aortic valve range between 3.8 and 4 m/s corresponding to mean transvalvular gradient estimated 39 mmHg. The DVI was reported 0.26 and aortic valve area calculated 1.25 cm. The patient was referred to the multidisciplinary heart valve clinic.Left and right heart catheterization performed October 21, 2018 confirmed the presence of severe aortic stenosis with peak to peak and mean transvalvular gradients measured 50 and 44.2 mmHg respectively. There was mild nonobstructive coronary artery disease and right heart pressures were normal.  The patient was evaluated by the multidisciplinary valve team and felt to have severe, symptomatic aortic stenosis. He underwent successful TAVR with a 26 mm Edwards Sapien 3 THV via the TF approach on 11/03/18. Post op echo showed EF 65% with normally functioning TAVR with mean gradient moderately elevated at 28 mm Hg and trivial PVL. He was discharged on POD  1 on Aspirin and plavix.   Today he present to clinic for follow up. He is doing well. No CP or SOB. No LE edema, orthopnea or PND. No dizziness or syncope. No blood in stool or urine. No  palpitations. He has been walking around the house due to the hot weather with no issues. He feels good. He has an anal fissure and is likely going to need a colonoscopy.   Past Medical History:  Diagnosis Date  . Aortic stenosis, severe   . Arthritis    "hands" (02/04/2018)  . Basal cell carcinoma    "MOHS surgery on sides of my nose" (02/04/2018)  . Chronic lower back pain   . CVA (cerebral vascular accident) ([place]) 01/29/2018  . CVA (cerebral vascular accident) ([place]) 02/01/2018  . Diarrhea   . Difficulty urinating   . Heart murmur    no problems  . High cholesterol   . Hypertension   . OSA on CPAP    "just got CPAP today" (02/04/2018)  . Rectal bleeding   . Rectal pain   . S/P TAVR (transcatheter aortic valve replacement) 11/03/2018   26 mm Edwards Sapien 3 transcatheter heart valve placed via percutaneous right transfemoral approach   . Stroke [facility])    "we were told that MRI showed previous stroke we were not aware that he had" (02/04/2018)   Past Surgical History:  Procedure Laterality Date  . ANTERIOR CERVICAL DECOMP/DISCECTOMY FUSION  2009  . BACK SURGERY  1980's  . COLONOSCOPY     x 2  . EXCISIONAL HEMORRHOIDECTOMY  1989 -2011 X 2 or 3  . LAPAROSCOPIC CHOLECYSTECTOMY  2005  . LUMBAR DISC SURGERY  1980s X 2  . MOHS SURGERY Bilateral 2006   "sides of my nose" x 2  . RIGHT/LEFT HEART CATH AND CORONARY ANGIOGRAPHY N/A 10/21/2018   Procedure: RIGHT/LEFT HEART CATH AND CORONARY ANGIOGRAPHY;  Surgeon: Burnell Blanks, MD;  Location: [place] [facility];  Service: Cardiovascular;  Laterality: N/A;  . TEE WITHOUT CARDIOVERSION N/A 02/03/2018   Procedure: TRANSESOPHAGEAL ECHOCARDIOGRAM (TEE);  Surgeon: Lelon Perla, MD;  Location: [facility] ENDOSCOPY;  Service: Cardiovascular;  Laterality: N/A;  . TEE WITHOUT CARDIOVERSION N/A 11/03/2018   Procedure: TRANSESOPHAGEAL ECHOCARDIOGRAM (TEE);  Surgeon: Burnell Blanks, MD;  Location: [place] CV LAB;  Service: Open Heart  Surgery;  Laterality: N/A;  . TRANSCATHETER AORTIC VALVE REPLACEMENT, TRANSFEMORAL N/A 11/03/2018   Procedure: TRANSCATHETER AORTIC VALVE REPLACEMENT, TRANSFEMORAL;  Surgeon: Burnell Blanks, MD;  Location: [place] [facility];  Service: Open Heart Surgery;  Laterality: N/A;  . WISDOM TOOTH EXTRACTION       Current Meds  Medication Sig  . amoxicillin (AMOXIL) 500 MG tablet Take 4 pills (2,000 mg) one hour prior to all dental visits. There are enough for TWO visits in this bottle.  Marland Kitchen aspirin EC 81 MG EC tablet Take 1 tablet (81 mg total) by mouth daily.  Marland Kitchen atorvastatin (LIPITOR) 80 MG tablet Take 1 tablet (80 mg total) by mouth daily at 6 PM.  . clopidogrel (PLAVIX) 75 MG tablet Take 1 tablet (75 mg total) by mouth daily.  Marland Kitchen doxazosin (CARDURA) 4 MG tablet Take 1 tablet (4 mg total) by mouth at bedtime.  . dutasteride (AVODART) 0.5 MG capsule Take 0.5 mg by mouth daily.  Marland Kitchen losartan (COZAAR) 50 MG tablet Take 1 tablet (50 mg total) by mouth daily.  . Nitroglycerin (RECTIV) 0.4 % OINT Place 1 Dose rectally 2 (two) times daily.  Marland Kitchen  tetrahydrozoline 0.05 % ophthalmic solution Place 1-2 drops into both eyes 3 (three) times daily as needed (dry/irritated eyes.).  . [DISCONTINUED] Lidocaine, Anorectal, (RECTICARE) 5 % CREA Place 1 application rectally 2 (two) times daily as needed (hemmorroids.).      Allergies:   Patient has no known allergies.   Social History   Tobacco Use  . Smoking status: Never Smoker  . Smokeless tobacco: Never Used  Substance Use Topics  . Alcohol use: Not Currently    Alcohol/week: 0.0 standard drinks  . Drug use: Not Currently     Family Hx: The patient's family history includes Cancer in his mother; Heart disease in his father. There is no history of Stroke.  ROS:   Please see the history of present illness.    All other systems reviewed and are negative.   Prior CV studies:   The following studies were reviewed today:  TAVR OPERATIVE  NOTE   Date of Procedure:11/03/2018  Preoperative Diagnosis:Severe Aortic Stenosis   Postoperative Diagnosis:Same   Procedure:   Transcatheter Aortic Valve Replacement - PercutaneousRightTransfemoral Approach Edwards Sapien 3 THV (size 60mm, model # 9600TFX, serial # [id number])  Co-Surgeons:Clarence H. Roxy Manns, MD and Lauree Chandler, MD  Anesthesiologist:Ryan Roanna Banning, MD  Echocardiographer:Mihai Croitoru, MD  Pre-operative Echo Findings: ? Severe aortic stenosis ? Normalleft ventricular systolic function  Post-operative Echo Findings: ? Traceparavalvular leak ? Normalleft ventricular systolic function   ____________________   Echo 11/04/18 IMPRESSIONS 1. The left ventricle has hyperdynamic systolic function, with an ejection fraction of >65%. The cavity size was normal. There is severely increased left ventricular wall thickness. Left ventricular diastolic Doppler parameters are consistent with  impaired relaxation. No evidence of left ventricular regional wall motion abnormalities. 2. The right ventricle has normal systolic function. The cavity was normal. There is no increase in right ventricular wall thickness. 3. Trivial pericardial effusion is present. 4. There is mild mitral annular calcification present. No evidence of mitral valve stenosis. No significant mitral regurgitation. 5. Bioprosthetic aortic valve s/p TAVR. There is trivial peri-valvular regurgitation. The mean gradient across the valve is moderately elevated at 28 mmHg. AVA 2.5 cm^2. ?High output/high flow as cause of elevated gradient, will need repeat echo in  future. 6. The aortic root is normal in size and structure. 7. Normal IVC size. PA systolic pressure 16 mmHg.  ____________________   Echo 11/25/18 IMPRESSIONS  1. The left ventricle has hyperdynamic  systolic function, with an ejection fraction of >65%. The cavity size was normal. There is moderate concentric left ventricular hypertrophy. Left ventricular diastolic Doppler parameters are consistent with  impaired relaxation. No evidence of left ventricular regional wall motion abnormalities.  2. Left atrial size was mildly dilated.  3. Trivial pericardial effusion is present.  4. The aortic root is normal in size and structure.  Aortic Valve: The aortic valve has been repaired/replaced Aortic valve regurgitation is mild by color flow Doppler. A 26 Edwards Sapien bioprosthetic, stented aortic valve (TAVR) valve is present in the aortic position. Procedure Date: 11/03/2018 Echo  findings are consistent with mild perivalvular leak of the aortic prosthesis. Mean gradient 17 mmHg.   Labs/Other Tests and Data Reviewed:    EKG:  No ECG reviewed.  Recent Labs: 01/29/2018: TSH 0.503 10/30/2018: ALT 24; B Natriuretic Peptide 36.0 11/04/2018: BUN 10; Creatinine, Ser 1.01; Hemoglobin 13.9; Magnesium 2.0; Platelets 154; Potassium 3.9; Sodium 138   Recent Lipid Panel Lab Results  Component Value Date/Time   CHOL 118 04/06/2018 08:13 AM   TRIG  175 (H) 04/06/2018 08:13 AM   HDL 38 (L) 04/06/2018 08:13 AM   CHOLHDL 3.1 04/06/2018 08:13 AM   CHOLHDL 6.9 01/30/2018 03:47 AM   LDLCALC 45 04/06/2018 08:13 AM    Wt Readings from Last 3 Encounters:  11/26/18 260 lb (117.9 kg)  11/11/18 265 lb 12.8 oz (120.6 kg)  11/04/18 267 lb 3.2 oz (121.2 kg)     Objective:    Vital Signs:  BP (!) 162/69   Pulse 69   Ht 5\' 10"  (1.778 m)   Wt 260 lb (117.9 kg)   BMI 37.31 kg/m    Well nourished, well developed male in no acute distress.   ASSESSMENT & PLAN:    Severe AS s/p TAVR: echo today shows EF 65%, normally functioning TAVR with mean gradient 17 mm Hg (improved from 28 mm Hg post operatively) and mild PVL. He has NYHA class I symptoms. SBE prophylaxis discussed; he has amoxicillin. Plavix can be  discontinued after 6 months of therapy (05/05/19). He may need a colonoscopy coming up. Per guidelines, he does not need SBE prophylaxis for a routine colonoscopy unless done in the setting of a known GI infection. He will likely need to be off plavix. If not urgent, this can be done next January when he completes 6 months of DAPT.   HTN: BP initially very elevated. He repeated it again and improved to 147/72, HR 66. Review of ECGs show HRs in 60-70s. Given mild- mod CAD, will plan to add Coreg 1.25mg  BID to his Losartan 50mg  daily. I will have him seen in the HTN clinic in the next couple weeks for titration. I have asked him to keep a log of his BPs  HLD: continue statin   Hx of CVA: continue aspirin and plavix.  Pulmonary nodule: patient is a never smoker and low risk. No follow up recommended .  COVID-19 Education: The signs and symptoms of COVID-19 were discussed with the patient and how to seek care for testing (follow up with PCP or arrange E-visit).  The importance of social distancing was discussed today.  Time:   Today, I have spent 20 minutes with the patient with telehealth technology discussing the above problems.     Medication Adjustments/Labs and Tests Ordered: Current medicines are reviewed at length with the patient today.  Concerns regarding medicines are outlined above.   Tests Ordered: No orders of the defined types were placed in this encounter.   Medication Changes: Meds ordered this encounter  Medications  . carvedilol (COREG) 3.125 MG tablet    Sig: Take 1 tablet (3.125 mg total) by mouth 2 (two) times daily.    Dispense:  60 tablet    Refill:  6    Disposition:  Follow up in 1 year(s)  Signed, [name], PA-C  11/26/2018 5:07 PM    [place]

## 2018-11-26 NOTE — Patient Instructions (Addendum)
Medication Instructions:  Your physician has recommended you make the following change in your medication:  Start Coreg 3.125 mg by mouth twice daily. Stop Clopidogrel on December 30,2020.  If you need a refill on your cardiac medications before your next appointment, please call your pharmacy.   Lab work: none If you have labs (blood work) drawn today and your tests are completely normal, you will receive your results only by: Marland Kitchen MyChart Message (if you have MyChart) OR . A paper copy in the mail If you have any lab test that is abnormal or we need to change your treatment, we will call you to review the results.  Testing/Procedures: Your physician has requested that you have an echocardiogram. Echocardiography is a painless test that uses sound waves to create images of your heart. It provides your doctor with information about the size and shape of your heart and how well your heart's chambers and valves are working. This procedure takes approximately one hour. There are no restrictions for this procedure.  To be done in June 2021--Scheduled for June 30,2021.  You will see K. Grandville Silos, PA same day.  Follow-Up: Follow up with Dr. Meda Coffee as planned.   You have been referred to the hypertension clinic in our office.  Appt is August 13,2020 at 9:30  Check your blood pressure twice daily and keep record of readings.  Bring these readings to the appointment in the hypertension clinic

## 2018-12-11 ENCOUNTER — Encounter: Payer: Self-pay | Admitting: Thoracic Surgery (Cardiothoracic Vascular Surgery)

## 2018-12-17 ENCOUNTER — Other Ambulatory Visit: Payer: Self-pay

## 2018-12-17 ENCOUNTER — Ambulatory Visit (INDEPENDENT_AMBULATORY_CARE_PROVIDER_SITE_OTHER): Payer: Medicare Other | Admitting: Pharmacist

## 2018-12-17 ENCOUNTER — Encounter: Payer: Self-pay | Admitting: Pharmacist

## 2018-12-17 VITALS — BP 134/68 | HR 68

## 2018-12-17 DIAGNOSIS — I10 Essential (primary) hypertension: Secondary | ICD-10-CM | POA: Diagnosis not present

## 2018-12-17 MED ORDER — LOSARTAN POTASSIUM 100 MG PO TABS: 100.0000 mg | ORAL_TABLET | Freq: Every day | ORAL | 3 refills | Status: DC

## 2018-12-17 NOTE — Progress Notes (Signed)
Patient ID: [name]                 DOB: May 10, 1945                      MRN: [id number]     HPI: [name] is a 73 y.o. male, patient of Dr. McAlhany/Dr. Meda Anderson referred by Eugene Form, PA to HTN clinic. PMH is significant for previous stroke,HTN,HLD,OSAon CPAP, seizures,skin cancer, mild-mod non obstructive CAD and severe AS s/p TAVR (11/03/18).  Patient was seen via telemedicine on 11/26/2018 by Nell Range. His blood pressure was elevated at carvedilol 3.125mg  twice a day was added.   Patient presents today in good spirits. He admits that has slight headaches sometimes, denies shortness of breath. States he feels a little whoozy in the AM when he wakes up but starts to go away are he moves around.   Patient has lost about 12-15 pounds since TAVR. He has been monitoring his blood pressure at home and brought his home monitor with him. At first reading blood pressure on home cuff was higher than office reading. However after adjust cuff on arm for better placement, home meter and clinic readings were very similar. Patient educated on proper use of meter.  Current HTN meds: losartan 50mg  daily, carvedilol 3.125mg  twice a day, doxazosin 4mg  at bedtime Previously tried:  BP goal: <130/80  Family History: The patient's family history includes Cancer in his mother; Heart disease in his father. There is no history of Stroke.  Social History: neg tobacco, neg ETOH  Diet: egg sausage, yogurt, wife cooks dinner-does cook with salt, doesn't drink caffiene  Exercise: walks 1-2 miles in the afternoon (unless its too hot) mostly everyday- going to start cardiac rehab at [facility] next week  Home BP readings: 2-3PM and then at dinner time Left 134/65 right 134/73 129/61 135/67 144/63 139/76 152/64 148/67 140/61 137/65 146/62 136/65 139/57 129/68 114/56 115/61 140/65 145/68 133/60 128/64 HR high 50's-low 70's mostly 60's  Wt Readings from Last 3 Encounters:  11/26/18  260 lb (117.9 kg)  11/11/18 265 lb 12.8 oz (120.6 kg)  11/04/18 267 lb 3.2 oz (121.2 kg)   BP Readings from Last 3 Encounters:  11/26/18 (!) 162/69  11/11/18 130/72  11/04/18 (!) 119/59   Pulse Readings from Last 3 Encounters:  11/26/18 69  11/11/18 69  11/04/18 73    Renal function: CrCl cannot be calculated (Patient's most recent lab result is older than the maximum 21 days allowed.).  Past Medical History:  Diagnosis Date  . Aortic stenosis, severe   . Arthritis    "hands" (02/04/2018)  . Basal cell carcinoma    "MOHS surgery on sides of my nose" (02/04/2018)  . Chronic lower back pain   . CVA (cerebral vascular accident) ([place]) 01/29/2018  . CVA (cerebral vascular accident) ([place]) 02/01/2018  . Diarrhea   . Difficulty urinating   . Heart murmur    no problems  . High cholesterol   . Hypertension   . OSA on CPAP    "just got CPAP today" (02/04/2018)  . Rectal bleeding   . Rectal pain   . S/P TAVR (transcatheter aortic valve replacement) 11/03/2018   26 mm Edwards Sapien 3 transcatheter heart valve placed via percutaneous right transfemoral approach   . Stroke [facility])    "we were told that MRI showed previous stroke we were not aware that he had" (02/04/2018)    Current Outpatient Medications  on File Prior to Visit  Medication Sig Dispense Refill  . amoxicillin (AMOXIL) 500 MG tablet Take 4 pills (2,000 mg) one hour prior to all dental visits. There are enough for TWO visits in this bottle. 8 tablet 8  . aspirin EC 81 MG EC tablet Take 1 tablet (81 mg total) by mouth daily. 30 tablet 3  . atorvastatin (LIPITOR) 80 MG tablet Take 1 tablet (80 mg total) by mouth daily at 6 PM. 90 tablet 3  . carvedilol (COREG) 3.125 MG tablet Take 1 tablet (3.125 mg total) by mouth 2 (two) times daily. 60 tablet 6  . clopidogrel (PLAVIX) 75 MG tablet Take 1 tablet (75 mg total) by mouth daily. 90 tablet 3  . doxazosin (CARDURA) 4 MG tablet Take 1 tablet (4 mg total) by mouth at bedtime. 90  tablet 3  . dutasteride (AVODART) 0.5 MG capsule Take 0.5 mg by mouth daily.  3  . losartan (COZAAR) 50 MG tablet Take 1 tablet (50 mg total) by mouth daily. 90 tablet 2  . Nitroglycerin (RECTIV) 0.4 % OINT Place 1 Dose rectally 2 (two) times daily.    Marland Kitchen tetrahydrozoline 0.05 % ophthalmic solution Place 1-2 drops into both eyes 3 (three) times daily as needed (dry/irritated eyes.).     No current facility-administered medications on file prior to visit.     No Known Allergies  There were no vitals taken for this visit.   Assessment/Plan:  1. Hypertension - Blood pressure is above goal of <130/80 in clinic today. Home readings variable, probably due to poor cuff placement. Will increase losartan to 100mg  daily. Patient advised that he can take 2 of the 50mg  tablets until he runs out. Will recheck BP in clinic in [redacted] weeks along with BMP. Patient asked to try to limit salt intake and ask him wife not to cook with salt, nor salt his food at the table. Educated on foods high in sodium. Encouraged him to continue to walk daily.   Thank you  Ramond Dial, Pharm.D, [place]  [address], [place], [place] [address]  Phone: [phone]; Fax: [phone]

## 2018-12-17 NOTE — Patient Instructions (Addendum)
It was a pleasure to meet you today.   The plan for your treatment is:  1. Increase losartan to 100mg  daily. You may take 2 of the 50mg  tablets until you run out. I have sent over a prescription for the 100mg  tablets to your pharmacy. 2. Pay attention to your sodium intake. Encourage your wife to not cook with salt and do not add salt to your food at the table. Pay attention to the food labels. Try to keep your daily sodium intake to 1500mg  per day. 3. Continue walking daily. Keep up the good work! 4. Call us at [phone] with any questions or concerns 5. Follow up in 3 weeks. We will do lab work at your next appointment.

## 2019-01-01 ENCOUNTER — Telehealth: Payer: Self-pay | Admitting: Cardiovascular Disease

## 2019-01-01 NOTE — Telephone Encounter (Signed)
     [facility] Pre-operative Risk Assessment    Request for surgical clearance:  1. What type of surgery is being performed? Colonoscopy due to rectal bleeding  2. When is this surgery scheduled? TBD  3. What type of clearance is required (medical clearance vs. Pharmacy clearance to hold med vs. Both)? both  4. Are there any medications that need to be held prior to surgery and how long? Plavix  5. Practice name and name of physician performing surgery? [facility], Dr. Watt Climes  6. What is your office phone number [phone]   7.   What is your office fax number [phone]  8.   Anesthesia type (None, local, MAC, general) ? propofol   [name] 01/01/2019, 8:09 AM  _________________________________________________________________   (provider comments below)

## 2019-01-01 NOTE — Telephone Encounter (Signed)
Note, patient previous had stroke in 01/2018 and was placed on aspirin and plavix. Recently he underwent TAVR on 11/03/2018. He was seen by Dr Watt Climes for lower GI bleed. Colonoscopy is planned.   I will reach out to the valve team to see what would be the earliest time to hold plavix after TAVR. Ok to hold plavix for 5 days prior to colonoscopy?

## 2019-01-01 NOTE — Telephone Encounter (Signed)
   Primary Cardiologist: Ena Dawley, MD  Chart reviewed as part of pre-operative protocol coverage. Patient was contacted 01/01/2019 in reference to pre-operative risk assessment for pending surgery as outlined below.  [name] was last seen via virtual visit by [name].  Since that day, [name] has done well without any chest pain and SOB. He is able to complete cardiac rehab and walk a mile on a daily basis.  Therefore, based on ACC/AHA guidelines, the patient would be at acceptable risk for the planned procedure without further cardiovascular testing.   I will route this recommendation to the requesting party via Epic fax function and remove from pre-op pool.  Please call with questions. I have discussed with both our valve team and the patient, he has been informed to hold plavix for 5 days prior to the procedure and restart as soon as possible afterward at the discretion of Dr. Watt Climes based on bleeding risk.  [name], [place] 01/01/2019, 3:41 PM

## 2019-01-01 NOTE — Telephone Encounter (Signed)
He is almost 8 weeks out from TAVR. He sent me a MyChart message about being concerned given recent rectal bleeding. I am okay with him temporarily stopping plavix at this time for a colonoscopy. I will write back to the patient that you are working on clearance. Thanks [place],  [place]

## 2019-01-07 ENCOUNTER — Telehealth: Payer: Self-pay | Admitting: Physician Assistant

## 2019-01-07 NOTE — Telephone Encounter (Signed)
  [facility]ENTER   MULTIDISCIPLINARY HEART VALVE TEAM   Received fax with recent BMET from outside clinic. Also faxed to [address] office and should be scanned in.   BMET 01/04/19  BUN 11 Creat 0.99 eGFR (non AA) 76 BUN/creat ratio 11 Sodium 141 Potassium 4.3 Chloride 102 Calcium 9.2  He has an appt with HTN clinic tomorrow to follow up on recent up titration of ARB.  [name] PA-C  [facility]

## 2019-01-08 ENCOUNTER — Ambulatory Visit (INDEPENDENT_AMBULATORY_CARE_PROVIDER_SITE_OTHER): Payer: Medicare Other | Admitting: Pharmacist

## 2019-01-08 ENCOUNTER — Encounter: Payer: Self-pay | Admitting: Pharmacist

## 2019-01-08 ENCOUNTER — Other Ambulatory Visit: Payer: Medicare Other

## 2019-01-08 ENCOUNTER — Other Ambulatory Visit: Payer: Self-pay

## 2019-01-08 VITALS — BP 128/68 | HR 59

## 2019-01-08 DIAGNOSIS — R739 Hyperglycemia, unspecified: Secondary | ICD-10-CM | POA: Diagnosis not present

## 2019-01-08 DIAGNOSIS — I10 Essential (primary) hypertension: Secondary | ICD-10-CM

## 2019-01-08 NOTE — Progress Notes (Signed)
Patient ID: [name]                 DOB: 1945-06-13                      MRN: [id number]     HPI: JASTON BARTOLOMUCCI is a 73 y.o. male, patient of Dr. McAlhany/Dr. Meda Coffee referred by [name], PA to HTN clinic. PMH is significant for previous stroke,HTN,HLD,OSAon CPAP, seizures,skin cancer, mild-mod non obstructive CAD and severe AS s/p TAVR (11/03/18).  Patient was last seen in the HTN on 8/13. At that visit, his losartan was increased to 100mg  daily and patient was encouraged to decrease his sodium intake. He was also educated on proper cuff placement for blood pressure measurement. His repeat BMP scr was stable at 0.99 and K 4.3.  States he feels a little whoozy/dizzy in the AM when he wakes up but starts to go away are he moves around. States that he feels off balance trying to put his pants on. Does not check his blood pressure when he feels this way.   Has been doing cardiac rehab 3 days a week. Walking at home the other days. States they have been checking his BP at cardiac rehab-running 120-140/60's.  Current HTN meds: losartan 100mg  daily, carvedilol 3.125mg  twice a day, doxazosin 4mg  at bedtime (for his prostate) Previously tried:  BP goal: <130/80  Family History: The patient's family history includes Cancer in his mother; Heart disease in his father. There is no history of Stroke.  Social History: neg tobacco, neg ETOH  Diet: egg sausage, yogurt, wife cooks dinner-does cook with salt, doesn't drink caffeine, diet sprit, sandwich with pimento cheese, snacks on M&M, peanut butter crackers, potatoe sticks  Exercise: walks 1-2 miles in the afternoon (unless its too hot) mostly everyday- going to start cardiac rehab at [facility] next week  Home BP readings: 120-140/60's  Wt Readings from Last 3 Encounters:  11/26/18 260 lb (117.9 kg)  11/11/18 265 lb 12.8 oz (120.6 kg)  11/04/18 267 lb 3.2 oz (121.2 kg)   BP Readings from Last 3 Encounters:  12/17/18 134/68   11/26/18 (!) 162/69  11/11/18 130/72   Pulse Readings from Last 3 Encounters:  12/17/18 68  11/26/18 69  11/11/18 69    Renal function: CrCl cannot be calculated (Patient's most recent lab result is older than the maximum 21 days allowed.).  Past Medical History:  Diagnosis Date  . Aortic stenosis, severe   . Arthritis    "hands" (02/04/2018)  . Basal cell carcinoma    "MOHS surgery on sides of my nose" (02/04/2018)  . Chronic lower back pain   . CVA (cerebral vascular accident) ([place]) 01/29/2018  . CVA (cerebral vascular accident) ([place]) 02/01/2018  . Diarrhea   . Difficulty urinating   . Heart murmur    no problems  . High cholesterol   . Hypertension   . OSA on CPAP    "just got CPAP today" (02/04/2018)  . Rectal bleeding   . Rectal pain   . S/P TAVR (transcatheter aortic valve replacement) 11/03/2018   26 mm Edwards Sapien 3 transcatheter heart valve placed via percutaneous right transfemoral approach   . Stroke [facility])    "we were told that MRI showed previous stroke we were not aware that he had" (02/04/2018)    Current Outpatient Medications on File Prior to Visit  Medication Sig Dispense Refill  . amoxicillin (AMOXIL) 500 MG tablet Take 4  pills (2,000 mg) one hour prior to all dental visits. There are enough for TWO visits in this bottle. 8 tablet 8  . aspirin EC 81 MG EC tablet Take 1 tablet (81 mg total) by mouth daily. 30 tablet 3  . atorvastatin (LIPITOR) 80 MG tablet Take 1 tablet (80 mg total) by mouth daily at 6 PM. 90 tablet 3  . carvedilol (COREG) 3.125 MG tablet Take 1 tablet (3.125 mg total) by mouth 2 (two) times daily. 60 tablet 6  . clopidogrel (PLAVIX) 75 MG tablet Take 1 tablet (75 mg total) by mouth daily. 90 tablet 3  . doxazosin (CARDURA) 4 MG tablet Take 1 tablet (4 mg total) by mouth at bedtime. 90 tablet 3  . dutasteride (AVODART) 0.5 MG capsule Take 0.5 mg by mouth daily.  3  . losartan (COZAAR) 100 MG tablet Take 1 tablet (100 mg total) by mouth  daily. 90 tablet 3  . Nitroglycerin (RECTIV) 0.4 % OINT Place 1 Dose rectally 2 (two) times daily.    Marland Kitchen tetrahydrozoline 0.05 % ophthalmic solution Place 1-2 drops into both eyes 3 (three) times daily as needed (dry/irritated eyes.).     No current facility-administered medications on file prior to visit.     No Known Allergies  There were no vitals taken for this visit.   Assessment/Plan:  1. Hypertension - Blood pressure is at goal today of <130/80. Appears to be at goal prior to cardiac rehab most days as well. Continue losartan 100mg  and carvedilol 3.125mg  twice a day. I have asked patient to check his blood pressure when he feels dizzy and to call me with the results. I am also concerned it might be a side effect of doxazosin and have asked him to speak with his doctor about changing to Flomax. Follow up in clinic as needed.  2. Diabetes- Patient had a lot of questions about how to lower his blood sugars. Not currently on medications. We had an extensive conversation about diet. See patient instructions section.   Thank you  Ramond Dial, Pharm.D, [place]  [id number] [place], [place] [address]  Phone: [phone]; Fax: [phone]

## 2019-01-08 NOTE — Patient Instructions (Addendum)
It was great to see you again today.  Try to focus on eliminating extra cabs from your diet. Avoid candy (like M&M except on special occasions) Try snacking on nuts instead of potato sticks Avoid breaded meats (such as chicken wings or fried fish) Try to bake or grill instead Try to avoid foods with more than 4g of added sugars  Continue cardiac rehab and walking on the other days. You are doing a great!  Continue  losartan 100mg  daily and carvedilol 3.125mg  twice a day.  Talk to your doctor about switching from doxazosin to Flomax  Take your blood pressure in the AM when you feel dizzy. Call me with the results.  Call me at [phone] with any questions

## 2019-01-25 ENCOUNTER — Telehealth: Payer: Self-pay

## 2019-01-25 NOTE — Telephone Encounter (Signed)
Spoke to pt on 01/25/19. Reviewed his meds and made him aware that we would be calling him tomorrow, Tues 01/26/19 to obtain his vitals 15 mins before his appt time.....JB

## 2019-01-26 ENCOUNTER — Telehealth (INDEPENDENT_AMBULATORY_CARE_PROVIDER_SITE_OTHER): Payer: Medicare Other | Admitting: Cardiology

## 2019-01-26 ENCOUNTER — Other Ambulatory Visit: Payer: Self-pay

## 2019-01-26 ENCOUNTER — Encounter: Payer: Self-pay | Admitting: *Deleted

## 2019-01-26 ENCOUNTER — Encounter: Payer: Self-pay | Admitting: Cardiology

## 2019-01-26 DIAGNOSIS — Z952 Presence of prosthetic heart valve: Secondary | ICD-10-CM

## 2019-01-26 DIAGNOSIS — I6300 Cerebral infarction due to thrombosis of unspecified precerebral artery: Secondary | ICD-10-CM

## 2019-01-26 DIAGNOSIS — I10 Essential (primary) hypertension: Secondary | ICD-10-CM | POA: Diagnosis not present

## 2019-01-26 DIAGNOSIS — R739 Hyperglycemia, unspecified: Secondary | ICD-10-CM

## 2019-01-26 DIAGNOSIS — I350 Nonrheumatic aortic (valve) stenosis: Secondary | ICD-10-CM | POA: Diagnosis not present

## 2019-01-26 NOTE — Patient Instructions (Signed)
Medication Instructions:   Your physician recommends that you continue on your current medications as directed. Please refer to the Current Medication list given to you today.  If you need a refill on your cardiac medications before your next appointment, please call your pharmacy.    Follow-Up: At [facility], you and your health needs are our priority.  As part of our continuing mission to provide you with exceptional heart care, we have created designated Provider Care Teams.  These Care Teams include your primary Cardiologist (physician) and Advanced Practice Providers (APPs -  Physician Assistants and Nurse Practitioners) who all work together to provide you with the care you need, when you need it. You will need a follow up appointment in 6 months with Dr. Meda Coffee in person.  Please call our office 2 months in advance to schedule this appointment.  You may see Ena Dawley, MD or one of the following Advanced Practice Providers on your designated Care Team:   Sorrel, PA-C Melina Copa, PA-C . Ermalinda Barrios, PA-C

## 2019-01-26 NOTE — Progress Notes (Signed)
Virtual Visit via Telephone Note   This visit type was conducted due to national recommendations for restrictions regarding the COVID-19 Pandemic (e.g. social distancing) in an effort to limit this patient's exposure and mitigate transmission in our community.  Due to his co-morbid illnesses, this patient is at least at moderate risk for complications without adequate follow up.  This format is felt to be most appropriate for this patient at this time.  The patient did not have access to video technology/had technical difficulties with video requiring transitioning to audio format only (telephone).  All issues noted in this document were discussed and addressed.  No physical exam could be performed with this format.  Please refer to the patient's chart for his  consent to telehealth for [facility].   Date:  01/26/2019   ID:  [name], DOB 18-Apr-1946, MRN [id number]  Patient Location: Home Provider Location: Home  PCP:  Cyndi Bender, PA-C  Cardiologist:  Ena Dawley, MD  Electrophysiologist:  None   Evaluation Performed:  Follow-Up Visit  Chief Complaint/reason for visit: 3 months follow up  History of Present Illness:    [name] is a 72 y.o. male with history of previous stroke, HTN, HLD, OSA on CPAP, seizures, skin cancer and severe AS s/p TAVR (11/03/18) who presents for follow up.   Catheterization performed October 21, 2018 confirmed the presence of severe aortic stenosis with peak to peak and mean transvalvular gradients measured 50 and 44.2 mmHg respectively. There was mild nonobstructive coronary artery disease and right heart pressures were normal.  The patient was evaluated by the multidisciplinary valve team and felt to have severe, symptomatic aortic stenosis. He underwent successful TAVR with a 26 mm Edwards Sapien 3 THV via the TF approach on 11/03/18. Post op echo showed EF 65% with normally functioning TAVR with mean gradient moderately elevated at 28 mm Hg  and trivial PVL. He was discharged on POD 1 on Aspirin and plavix.   01/26/2019 - Today he states that he has been going to the cardiac rehab, walking on a treadmill for 30 minutes 3x/week, denies any chest pain, DOE. He states that he has more energy now. He has no LE edema. He has been complaint with his meds.   The patient does not have symptoms concerning for COVID-19 infection (fever, chills, cough, or new shortness of breath).   Past Medical History:  Diagnosis Date  . Aortic stenosis, severe   . Arthritis    "hands" (02/04/2018)  . Basal cell carcinoma    "MOHS surgery on sides of my nose" (02/04/2018)  . Chronic lower back pain   . CVA (cerebral vascular accident) ([place]) 01/29/2018  . CVA (cerebral vascular accident) ([place]) 02/01/2018  . Diarrhea   . Difficulty urinating   . Heart murmur    no problems  . High cholesterol   . Hypertension   . OSA on CPAP    "just got CPAP today" (02/04/2018)  . Rectal bleeding   . Rectal pain   . S/P TAVR (transcatheter aortic valve replacement) 11/03/2018   26 mm Edwards Sapien 3 transcatheter heart valve placed via percutaneous right transfemoral approach   . Stroke [facility])    "we were told that MRI showed previous stroke we were not aware that he had" (02/04/2018)   Past Surgical History:  Procedure Laterality Date  . ANTERIOR CERVICAL DECOMP/DISCECTOMY FUSION  2009  . BACK SURGERY  1980's  . COLONOSCOPY     x 2  .  EXCISIONAL HEMORRHOIDECTOMY  1989 -2011 X 2 or 3  . LAPAROSCOPIC CHOLECYSTECTOMY  2005  . LUMBAR DISC SURGERY  1980s X 2  . MOHS SURGERY Bilateral 2006   "sides of my nose" x 2  . RIGHT/LEFT HEART CATH AND CORONARY ANGIOGRAPHY N/A 10/21/2018   Procedure: RIGHT/LEFT HEART CATH AND CORONARY ANGIOGRAPHY;  Surgeon: Burnell Blanks, MD;  Location: [facility];  Service: Cardiovascular;  Laterality: N/A;  . TEE WITHOUT CARDIOVERSION N/A 02/03/2018   Procedure: TRANSESOPHAGEAL ECHOCARDIOGRAM (TEE);  Surgeon: Lelon Perla, MD;  Location: [facility] ENDOSCOPY;  Service: Cardiovascular;  Laterality: N/A;  . TEE WITHOUT CARDIOVERSION N/A 11/03/2018   Procedure: TRANSESOPHAGEAL ECHOCARDIOGRAM (TEE);  Surgeon: Burnell Blanks, MD;  Location: [facility] CV LAB;  Service: Open Heart Surgery;  Laterality: N/A;  . TRANSCATHETER AORTIC VALVE REPLACEMENT, TRANSFEMORAL N/A 11/03/2018   Procedure: TRANSCATHETER AORTIC VALVE REPLACEMENT, TRANSFEMORAL;  Surgeon: Burnell Blanks, MD;  Location: [facility] CV LAB;  Service: Open Heart Surgery;  Laterality: N/A;  . WISDOM TOOTH EXTRACTION       Current Meds  Medication Sig  . acetaminophen (TYLENOL) 650 MG CR tablet Take 650 mg by mouth every 8 (eight) hours as needed.  . tamsulosin (FLOMAX) 0.4 MG CAPS capsule Take 0.4 mg by mouth daily.  . [DISCONTINUED] acetaminophen (TYLENOL) 500 MG tablet Take 650 mg by mouth every 6 (six) hours as needed.     Allergies:   Patient has no known allergies.   Social History   Tobacco Use  . Smoking status: Never Smoker  . Smokeless tobacco: Never Used  Substance Use Topics  . Alcohol use: Not Currently    Alcohol/week: 0.0 standard drinks  . Drug use: Not Currently     Family Hx: The patient's family history includes Cancer in his mother; Heart disease in his father. There is no history of Stroke.  ROS:   Please see the history of present illness.    All other systems reviewed and are negative.   Prior CV studies:   The following studies were reviewed today:  Labs/Other Tests and Data Reviewed:    EKG:  No ECG reviewed.  Recent Labs: 01/29/2018: TSH 0.503 10/30/2018: ALT 24; B Natriuretic Peptide 36.0 11/04/2018: BUN 10; Creatinine, Ser 1.01; Hemoglobin 13.9; Magnesium 2.0; Platelets 154; Potassium 3.9; Sodium 138   Recent Lipid Panel Lab Results  Component Value Date/Time   CHOL 118 04/06/2018 08:13 AM   TRIG 175 (H) 04/06/2018 08:13 AM   HDL 38 (L) 04/06/2018 08:13 AM   CHOLHDL 3.1 04/06/2018 08:13  AM   CHOLHDL 6.9 01/30/2018 03:47 AM   LDLCALC 45 04/06/2018 08:13 AM    Wt Readings from Last 3 Encounters:  01/26/19 253 lb (114.8 kg)  11/26/18 260 lb (117.9 kg)  11/11/18 265 lb 12.8 oz (120.6 kg)     Objective:    Vital Signs:  BP 128/61   Pulse 62   Wt 253 lb (114.8 kg)   BMI 36.30 kg/m    VITAL SIGNS:  reviewed  ASSESSMENT & PLAN:    Severe AS s/p TAVR: doing well. Continue Asprin and plavix.  Post op echo showed EF 65% with normally functioning TAVR with mean gradient moderately elevated at 28 mm Hg and trivial PVL. Echo on 11/25/2018 showed mild PVL and mean gradient 17 mmHg. The patient is asymptomatic, having more energy, continues cardiac rehab.   HTN: well controlled after increasing losartan to 100 mg po daily.   HLD: continue  statin   Hx of CVA: continue aspirin and plavix.  Pulmonary nodule: patient is a never smoker and low risk. No follow up recommended .  COVID-19 Education: The signs and symptoms of COVID-19 were discussed with the patient and how to seek care for testing (follow up with PCP or arrange E-visit).  The importance of social distancing was discussed today.  Time:   Today, I have spent 25 minutes with the patient with telehealth technology discussing the above problems.    Medication Adjustments/Labs and Tests Ordered: Current medicines are reviewed at length with the patient today.  Concerns regarding medicines are outlined above.   Tests Ordered: No orders of the defined types were placed in this encounter.   Medication Changes: No orders of the defined types were placed in this encounter.   Follow Up:  In Person in 6 month(s)  Signed, Ena Dawley, MD  01/26/2019 9:45 AM    [place]

## 2019-02-09 ENCOUNTER — Other Ambulatory Visit: Payer: Self-pay | Admitting: Physician Assistant

## 2019-02-18 ENCOUNTER — Other Ambulatory Visit: Payer: Self-pay | Admitting: Physician Assistant

## 2019-02-18 DIAGNOSIS — E785 Hyperlipidemia, unspecified: Secondary | ICD-10-CM

## 2019-03-19 ENCOUNTER — Other Ambulatory Visit: Payer: Self-pay | Admitting: Physician Assistant

## 2019-04-15 DIAGNOSIS — E785 Hyperlipidemia, unspecified: Secondary | ICD-10-CM

## 2019-04-16 MED ORDER — ATORVASTATIN CALCIUM 80 MG PO TABS: ORAL_TABLET | ORAL | 3 refills | Status: DC

## 2019-04-16 NOTE — Telephone Encounter (Signed)
Dorothy Spark, MD  Nuala Alpha, LPN  Hi Mr. Oen, I am glad you feeling better. Yes you should continue taking atorvastatin, IV please refill. Thank you.     Informed the pt through his mychart that Dr. Meda Coffee wants him to continue taking his atorvastatin at its current dose, and I will send in the refills to his confirmed pharmacy of choice.

## 2019-05-08 DIAGNOSIS — Z952 Presence of prosthetic heart valve: Secondary | ICD-10-CM

## 2019-05-08 DIAGNOSIS — Z789 Other specified health status: Secondary | ICD-10-CM

## 2019-05-08 DIAGNOSIS — E785 Hyperlipidemia, unspecified: Secondary | ICD-10-CM

## 2019-05-11 NOTE — Telephone Encounter (Signed)
Dorothy Spark, MD  Nuala Alpha, LPN  I would place statins on hold and refer him to the lipid clinic.    Sent the pt a message on mychart that per Dr. Meda Coffee, he needs to stop taking his statin, and we will refer him to our lipid clinic for further management of this issue.  Informed the pt that I will place the referral to our lipid clinic in the system and send a message to our Schedulers to call him back and arrange this appt.  Updated his atorvastatin as an intolerance in his allergies.  Advised the pt to call back or message back, if there is any issues with these recommendations.

## 2019-05-11 NOTE — Telephone Encounter (Signed)
RE: pt needs to be reffered to Lipid clinic for statin intolerance per Dr. Meda Coffee Received: Today Message Contents  Jerlyn Ly, LPN  [id number] @ [id number]

## 2019-05-12 ENCOUNTER — Other Ambulatory Visit: Payer: Self-pay

## 2019-05-12 ENCOUNTER — Ambulatory Visit (INDEPENDENT_AMBULATORY_CARE_PROVIDER_SITE_OTHER): Payer: Medicare PPO | Admitting: Pharmacist

## 2019-05-12 DIAGNOSIS — E782 Mixed hyperlipidemia: Secondary | ICD-10-CM

## 2019-05-12 MED ORDER — ROSUVASTATIN CALCIUM 20 MG PO TABS: ORAL_TABLET | ORAL | 11 refills | Status: DC

## 2019-05-12 NOTE — Progress Notes (Signed)
Patient ID: [name]                 DOB: [birth year]                    MRN: [id number]     HPI: [name] is a 74 y.o. male patient referred to lipid clinic by Dr. Meda Coffee. PMH is significant for previous stroke,HTN,HLD,OSAon CPAP, seizures,skin cancer, mild-mod non obstructive CAD and severe AS s/p TAVR (11/03/18). Patient was last seen in pharmacy clinic 01/08/2019 by Dr. Marcelle Overlie for HTN management. BP 128/68 and HR 59. Instructed to continue losartan 100 mg QD and carvedilol 3.125 mg BID. Also had extensive conversation with patient about diet in regards to his diabetes.   Patient sent my-chart messages about atorvastatin over the past 2 weeks reporting muscle soreness and constipation. Patient was instructed to stop atorvastatin and follow-up with pharmacist at lipid clinic.  Patients stopped taking atorvastatin yesterday. Symptoms on atorvastatin included pain in his ribs, joint pain in fingers, and numbness in feet. Also reports mental fogginess that has worsened over the last 3-4 months. Pt previously reported bleeding from his rectum. He is no longer on Plavix. He has seen GI for follow up - colonoscopy revealed both internal and external hemorrhoids. Has used Colace and Miralax to help with constipation, reported better efficacy with Miralax.   Current Medications: none  Intolerances: atorvastatin 80 mg QD- on hold by provider on 05/07/2018 due to patient report of muscle soreness No complaints on atorvastatin 20 mg.   Risk Factors:  previous stroke,HTN,HLD,OSAon CPAP, mild-mod non obstructive CAD and severe AS s/p TAVR (11/03/18).   LDL goal: < 70 mg/dL  Diet: egg sausage, yogurt, wife cooks dinner-does cook with salt, doesn't drink caffeine, diet sprit, sandwich with pimento cheese, snacks on M&M, peanut butter crackers, potato sticks  Exercise: Walks 1-2 miles in the afternoon (unless its too hot) mostly everyday. 01/2019 started going to start cardiac rehab at [facility] three days a week. He does 30 min on the treadmill with incline of 2.5 and speed of 2.5.   Family History: Mother- cancer; Father- heart disease. No fam hx of stroke  Social History: neg tobacco, neg ETOH  Labs: 04/05/19: TC 113, TG 172, HDL 39, LDL 45 (atorvastatin 80mg  daily) 04/06/18: TC 118, TG 175, HDL 38, LDL 45 (atorvastatin 80 mg daily)  Past Medical History:  Diagnosis Date  . Aortic stenosis, severe   . Arthritis    "hands" (02/04/2018)  . Basal cell carcinoma    "MOHS surgery on sides of my nose" (02/04/2018)  . Chronic lower back pain   . CVA (cerebral vascular accident) ([place]) 01/29/2018  . CVA (cerebral vascular accident) ([place]) 02/01/2018  . Diarrhea   . Difficulty urinating   . Heart murmur    no problems  . High cholesterol   . Hypertension   . OSA on CPAP    "just got CPAP today" (02/04/2018)  . Rectal bleeding   . Rectal pain   . S/P TAVR (transcatheter aortic valve replacement) 11/03/2018   26 mm Edwards Sapien 3 transcatheter heart valve placed via percutaneous right transfemoral approach   . Stroke [facility])    "we were told that MRI showed previous stroke we were not aware that he had" (02/04/2018)    Current Outpatient Medications on File Prior to Visit  Medication Sig Dispense Refill  . acetaminophen (TYLENOL) 650 MG CR tablet Take 650 mg by mouth every  8 (eight) hours as needed.    Marland Kitchen amoxicillin (AMOXIL) 500 MG tablet Take 4 pills (2,000 mg) one hour prior to all dental visits. There are enough for TWO visits in this bottle. 8 tablet 8  . aspirin EC 81 MG EC tablet Take 1 tablet (81 mg total) by mouth daily. 30 tablet 3  . carvedilol (COREG) 3.125 MG tablet TAKE 1 TABLET BY MOUTH TWICE A DAY 180 tablet 2  . clopidogrel (PLAVIX) 75 MG tablet TAKE 1 TABLET BY MOUTH EVERY DAY 90 tablet 3  . dutasteride (AVODART) 0.5 MG capsule Take 0.5 mg by mouth daily.  3  . losartan (COZAAR) 100 MG tablet Take 1 tablet (100 mg total) by mouth daily. 90 tablet 3  .  tamsulosin (FLOMAX) 0.4 MG CAPS capsule Take 0.4 mg by mouth daily.    Marland Kitchen tetrahydrozoline 0.05 % ophthalmic solution Place 1-2 drops into both eyes 3 (three) times daily as needed (dry/irritated eyes.).     No current facility-administered medications on file prior to visit.    Allergies  Allergen Reactions  . Atorvastatin Other (See Comments)    Pt reports causes muscle aches, constipation, and GI issues.      Assessment/Plan:  1. Hyperlipidemia - LDL 45 mg/dL on atorvastatin 80 mg at goal of < 70 mg/dL. However, patient stopped atorvastatin yesterday (05/10/18) secondary to myalgias. Discussed benefit of staying on a statin for cardiovascular benefit. Pt is agreeable to starting rosuvastatin 20 mg daily. Advised him to start this in 1-2 weeks to ensure symptoms improve off of atorvastatin. Advised him he can cut his tablets in half and take 10mg  daily for the first few weeks to ensure he tolerates therapy well. Will call pt in 1 month to assess tolerability. Can schedule f/u labs at that time.  Julieta Bellini, PharmD Candidate  Megan E. Supple, PharmD, BCACP, [place] [id number] [place], [place] [address] Phone: [phone]; Fax: [phone] 05/12/2019 4:29 PM

## 2019-05-12 NOTE — Patient Instructions (Signed)
It was nice to see you today  Your LDL goal is less than 70  Your LDL was excellent at 45 on the atorvastatin. Please stay off of the atorvastatin since it seems to be causing joint pain and memory problems. Keep an eye on your symptoms over the next 1-2 weeks. If they are from the atorvastatin, they should start to improve.  In 2 weeks or when you are feeling better, start taking rosuvastatin for your cholesterol. You can cut the tablets in half for the first few weeks to make sure you tolerate them ok, then increase your dose to 1 full tablet daily  We would like to recheck your fasting cholesterol numbers in about 3 months (your primary care doctor can check this)  Call Jinny Blossom, Pharmacist with any issues [phone]

## 2019-06-09 ENCOUNTER — Telehealth: Payer: Self-pay | Admitting: Pharmacist

## 2019-06-09 NOTE — Telephone Encounter (Signed)
Called pt to follow up with rosuvastatin tolerability. Pt states he stopped his atorvastatin for 2 weeks, symptoms improved, and then he started his rosuvastatin. He has taken the full 20mg  tablet daily for the past 2 weeks and reports tolerating it much better than atorvastatin (not noticing any brain fog). He will continue on rosuvastatin 20mg  daily, encouraged him to call us with any concerns, and he will have his PCP check fasting lipid panel since he sees him every 3-4 months.

## 2019-07-28 ENCOUNTER — Encounter (HOSPITAL_COMMUNITY): Payer: Self-pay | Admitting: Emergency Medicine

## 2019-07-28 ENCOUNTER — Emergency Department (HOSPITAL_COMMUNITY): Payer: Medicare PPO

## 2019-07-28 ENCOUNTER — Emergency Department (HOSPITAL_COMMUNITY)
Admission: EM | Admit: 2019-07-28 | Discharge: 2019-07-28 | Disposition: A | Discharge status: Home / Self Care | Payer: Medicare PPO | Attending: Emergency Medicine | Admitting: Emergency Medicine

## 2019-07-28 ENCOUNTER — Other Ambulatory Visit: Payer: Self-pay

## 2019-07-28 DIAGNOSIS — Z7982 Long term (current) use of aspirin: Secondary | ICD-10-CM | POA: Insufficient documentation

## 2019-07-28 DIAGNOSIS — C44311 Basal cell carcinoma of skin of nose: Secondary | ICD-10-CM | POA: Insufficient documentation

## 2019-07-28 DIAGNOSIS — Z7902 Long term (current) use of antithrombotics/antiplatelets: Secondary | ICD-10-CM | POA: Insufficient documentation

## 2019-07-28 DIAGNOSIS — R0602 Shortness of breath: Secondary | ICD-10-CM | POA: Diagnosis not present

## 2019-07-28 DIAGNOSIS — M542 Cervicalgia: Secondary | ICD-10-CM | POA: Diagnosis not present

## 2019-07-28 DIAGNOSIS — I10 Essential (primary) hypertension: Secondary | ICD-10-CM

## 2019-07-28 DIAGNOSIS — R42 Dizziness and giddiness: Secondary | ICD-10-CM | POA: Insufficient documentation

## 2019-07-28 DIAGNOSIS — R61 Generalized hyperhidrosis: Secondary | ICD-10-CM | POA: Diagnosis not present

## 2019-07-28 DIAGNOSIS — Z79899 Other long term (current) drug therapy: Secondary | ICD-10-CM | POA: Diagnosis not present

## 2019-07-28 LAB — URINALYSIS, ROUTINE W REFLEX MICROSCOPIC
Bacteria, UA: NONE SEEN
Bilirubin Urine: NEGATIVE
Glucose, UA: NEGATIVE mg/dL
Ketones, ur: NEGATIVE mg/dL
Leukocytes,Ua: NEGATIVE
Nitrite: NEGATIVE
Protein, ur: NEGATIVE mg/dL
Specific Gravity, Urine: 1.006 (ref 1.005–1.030)
pH: 6 (ref 5.0–8.0)

## 2019-07-28 LAB — CBC WITH DIFFERENTIAL/PLATELET
Abs Immature Granulocytes: 0.04 10*3/uL (ref 0.00–0.07)
Basophils Absolute: 0.1 10*3/uL (ref 0.0–0.1)
Basophils Relative: 1 %
Eosinophils Absolute: 0.1 10*3/uL (ref 0.0–0.5)
Eosinophils Relative: 1 %
HCT: 49.2 % (ref 39.0–52.0)
Hemoglobin: 15.6 g/dL (ref 13.0–17.0)
Immature Granulocytes: 1 %
Lymphocytes Relative: 28 %
Lymphs Abs: 2.5 10*3/uL (ref 0.7–4.0)
MCH: 30.2 pg (ref 26.0–34.0)
MCHC: 31.7 g/dL (ref 30.0–36.0)
MCV: 95.3 fL (ref 80.0–100.0)
Monocytes Absolute: 0.8 10*3/uL (ref 0.1–1.0)
Monocytes Relative: 9 %
Neutro Abs: 5.3 10*3/uL (ref 1.7–7.7)
Neutrophils Relative %: 60 %
Platelets: 177 10*3/uL (ref 150–400)
RBC: 5.16 MIL/uL (ref 4.22–5.81)
RDW: 12.5 % (ref 11.5–15.5)
WBC: 8.8 10*3/uL (ref 4.0–10.5)
nRBC: 0 % (ref 0.0–0.2)

## 2019-07-28 LAB — COMPREHENSIVE METABOLIC PANEL
ALT: 21 U/L (ref 0–44)
AST: 24 U/L (ref 15–41)
Albumin: 4.1 g/dL (ref 3.5–5.0)
Alkaline Phosphatase: 54 U/L (ref 38–126)
Anion gap: 10 (ref 5–15)
BUN: 13 mg/dL (ref 8–23)
CO2: 25 mmol/L (ref 22–32)
Calcium: 9.2 mg/dL (ref 8.9–10.3)
Chloride: 104 mmol/L (ref 98–111)
Creatinine, Ser: 1.07 mg/dL (ref 0.61–1.24)
GFR calc Af Amer: >60 mL/min (ref >60)
GFR calc non Af Amer: >60 mL/min (ref >60)
Glucose, Bld: 131 mg/dL — ABNORMAL HIGH (ref 70–99)
Potassium: 4.4 mmol/L (ref 3.5–5.1)
Sodium: 139 mmol/L (ref 135–145)
Total Bilirubin: 0.6 mg/dL (ref 0.3–1.2)
Total Protein: 7.2 g/dL (ref 6.5–8.1)

## 2019-07-28 LAB — TROPONIN I (HIGH SENSITIVITY)
Troponin I (High Sensitivity): 5 ng/L (ref <18)
Troponin I (High Sensitivity): 6 ng/L (ref <18)

## 2019-07-28 LAB — I-STAT CHEM 8, ED
BUN: 16 mg/dL (ref 8–23)
Calcium, Ion: 1.19 mmol/L (ref 1.15–1.40)
Chloride: 102 mmol/L (ref 98–111)
Creatinine, Ser: 0.9 mg/dL (ref 0.61–1.24)
Glucose, Bld: 124 mg/dL — ABNORMAL HIGH (ref 70–99)
HCT: 47 % (ref 39.0–52.0)
Hemoglobin: 16 g/dL (ref 13.0–17.0)
Potassium: 4.2 mmol/L (ref 3.5–5.1)
Sodium: 140 mmol/L (ref 135–145)
TCO2: 31 mmol/L (ref 22–32)

## 2019-07-28 MED ORDER — IOHEXOL 350 MG/ML SOLN
75.0000 mL | Freq: Once | INTRAVENOUS | Status: AC
  Administered 2019-07-28: 75 mL via INTRAVENOUS

## 2019-07-28 MED ORDER — LORAZEPAM 2 MG/ML IJ SOLN
1.0000 mg | Freq: Once | INTRAMUSCULAR | Status: AC
  Administered 2019-07-28: 1 mg via INTRAVENOUS
  Filled 2019-07-28: qty 1

## 2019-07-28 MED ORDER — HYDROCHLOROTHIAZIDE 25 MG PO TABS: 25.0000 mg | ORAL_TABLET | Freq: Every day | ORAL | 0 refills | Status: DC

## 2019-07-28 MED ORDER — MECLIZINE HCL 12.5 MG PO TABS: 12.5000 mg | ORAL_TABLET | Freq: Three times a day (TID) | ORAL | 0 refills | Status: DC | PRN

## 2019-07-28 NOTE — Discharge Instructions (Signed)
Take meclizine as needed for dizziness   Your CT scan and MRI showed no stroke today.  Follow-up with your doctor.  Return to ER if you had worse dizziness or chest pain or passing out.

## 2019-07-28 NOTE — ED Notes (Signed)
Got patient undress into a gown on the monitor did ekg shown to Dr Darl Householder patient is resting with call bell in reach

## 2019-07-28 NOTE — ED Notes (Signed)
Pt transported to MRI 

## 2019-07-28 NOTE — ED Provider Notes (Signed)
[facility] EMERGENCY DEPARTMENT Provider Note   CSN: [id number] Arrival date & time: 07/28/19  [id number]     History Chief Complaint  Patient presents with  . Dizziness    QUADARRIUS DOBIES is a 74 y.o. male history of stroke with residual trouble speaking, hypertension, hyperlipidemia, here presenting with dizziness, diaphoresis.  Patient states that he woke up today had acute onset of dizziness.  He states that he just feels that his balance is off.  He also has some neck pain as well.  He also feels sweaty as well and has some subjective shortness of breath.  Denies any fevers or chills or cough.  Denies any chest pain currently.  He states that he had a stroke in 2019 with some residual trouble speaking.  He also had a TVAR done in 2020 but is not on anticoagulation.   The history is provided by the patient.       Past Medical History:  Diagnosis Date  . Aortic stenosis, severe   . Arthritis    "hands" (02/04/2018)  . Basal cell carcinoma    "MOHS surgery on sides of my nose" (02/04/2018)  . Chronic lower back pain   . CVA (cerebral vascular accident) ([place]) 01/29/2018  . CVA (cerebral vascular accident) ([place]) 02/01/2018  . Diarrhea   . Difficulty urinating   . Heart murmur    no problems  . High cholesterol   . Hypertension   . OSA on CPAP    "just got CPAP today" (02/04/2018)  . Rectal bleeding   . Rectal pain   . S/P TAVR (transcatheter aortic valve replacement) 11/03/2018   26 mm Edwards Sapien 3 transcatheter heart valve placed via percutaneous right transfemoral approach   . Stroke [facility])    "we were told that MRI showed previous stroke we were not aware that he had" (02/04/2018)    Patient Active Problem List   Diagnosis Date Noted  . Hyperglycemia 01/08/2019  . S/P TAVR (transcatheter aortic valve replacement) 11/03/2018  . Aortic stenosis, severe   . Chest pain 02/24/2018  . Aortic stenosis 02/24/2018  . Palpitations 02/24/2018  . Hyperlipemia  02/24/2018  . Aphasia   . Observed seizure-like activity ([place]) 02/01/2018  . CVA (cerebral vascular accident) ([place]) 01/29/2018  . Brain tumor ([place]) 01/29/2018  . Essential hypertension 01/29/2018  . BPH (benign prostatic hyperplasia) 01/29/2018  . Anal or rectal pain 05/27/2011    Past Surgical History:  Procedure Laterality Date  . ANTERIOR CERVICAL DECOMP/DISCECTOMY FUSION  2009  . BACK SURGERY  1980's  . COLONOSCOPY     x 2  . EXCISIONAL HEMORRHOIDECTOMY  1989 -2011 X 2 or 3  . LAPAROSCOPIC CHOLECYSTECTOMY  2005  . LUMBAR DISC SURGERY  1980s X 2  . MOHS SURGERY Bilateral 2006   "sides of my nose" x 2  . RIGHT/LEFT HEART CATH AND CORONARY ANGIOGRAPHY N/A 10/21/2018   Procedure: RIGHT/LEFT HEART CATH AND CORONARY ANGIOGRAPHY;  Surgeon: Burnell Blanks, MD;  Location: [facility] CV LAB;  Service: Cardiovascular;  Laterality: N/A;  . TEE WITHOUT CARDIOVERSION N/A 02/03/2018   Procedure: TRANSESOPHAGEAL ECHOCARDIOGRAM (TEE);  Surgeon: Lelon Perla, MD;  Location[facility] ENDOSCOPY;  Service: Cardiovascular;  Laterality: N/A;  . TEE WITHOUT CARDIOVERSION N/A 11/03/2018   Procedure: TRANSESOPHAGEAL ECHOCARDIOGRAM (TEE);  Surgeon: Burnell Blanks, MD;  Location: [facility] CV LAB;  Service: Open Heart Surgery;  Laterality: N/A;  . TRANSCATHETER AORTIC VALVE REPLACEMENT, TRANSFEMORAL N/A 11/03/2018   Procedure:  TRANSCATHETER AORTIC VALVE REPLACEMENT, TRANSFEMORAL;  Surgeon: Burnell Blanks, MD;  Location: [facility] CV LAB;  Service: Open Heart Surgery;  Laterality: N/A;  . WISDOM TOOTH EXTRACTION         Family History  Problem Relation Age of Onset  . Cancer Mother        breast/colon  . Heart disease Father   . Stroke Neg Hx     Social History   Tobacco Use  . Smoking status: Never Smoker  . Smokeless tobacco: Never Used  Substance Use Topics  . Alcohol use: Not Currently    Alcohol/week: 0.0 standard drinks  . Drug use: Not Currently    Home  Medications Prior to Admission medications   Medication Sig Start Date End Date Taking? Authorizing Provider  acetaminophen (TYLENOL) 650 MG CR tablet Take 650 mg by mouth every 8 (eight) hours as needed.    [provider]  amoxicillin (AMOXIL) 500 MG tablet Take 4 pills (2,000 mg) one hour prior to all dental visits. There are enough for TWO visits in this bottle. 11/11/18   Eileen Stanford, PA-C  aspirin EC 81 MG EC tablet Take 1 tablet (81 mg total) by mouth daily. 02/05/18   Dana Allan I, MD  carvedilol (COREG) 3.125 MG tablet TAKE 1 TABLET BY MOUTH TWICE A DAY 03/22/19   Eileen Stanford, PA-C  clopidogrel (PLAVIX) 75 MG tablet TAKE 1 TABLET BY MOUTH EVERY DAY 02/09/19   Imogene Burn, PA-C  dutasteride (AVODART) 0.5 MG capsule Take 0.5 mg by mouth daily. 12/29/17   [provider]  losartan (COZAAR) 100 MG tablet Take 1 tablet (100 mg total) by mouth daily. 12/17/18   Burnell Blanks, MD  rosuvastatin (CRESTOR) 20 MG tablet Take 1 tablet by mouth daily 06/09/19   Dorothy Spark, MD  tamsulosin (FLOMAX) 0.4 MG CAPS capsule Take 0.4 mg by mouth daily.    [provider]  tetrahydrozoline 0.05 % ophthalmic solution Place 1-2 drops into both eyes 3 (three) times daily as needed (dry/irritated eyes.).    [provider]    Allergies    Atorvastatin  Review of Systems   Review of Systems  Neurological: Positive for dizziness.  All other systems reviewed and are negative.   Physical Exam Updated Vital Signs BP 123/65 (BP Location: Right Arm)   Pulse (!) 58   Temp 98.3 F (36.8 C) (Oral)   Resp 16   Ht 5\' 10"  (1.778 m)   Wt 113.4 kg   SpO2 96%   BMI 35.87 kg/m   Physical Exam Vitals and nursing note reviewed.  Constitutional:      Comments: Chronically ill but not acutely ill   HENT:     Head: Normocephalic.     Nose: Nose normal.     Mouth/Throat:     Mouth: Mucous membranes are moist.  Eyes:     Extraocular  Movements: Extraocular movements intact.     Pupils: Pupils are equal, round, and reactive to light.  Cardiovascular:     Rate and Rhythm: Normal rate and regular rhythm.     Pulses: Normal pulses.     Heart sounds: Normal heart sounds.  Pulmonary:     Effort: Pulmonary effort is normal.     Breath sounds: Normal breath sounds.  Abdominal:     General: Abdomen is flat.     Palpations: Abdomen is soft.  Musculoskeletal:        General: Normal range  of motion.     Cervical back: Normal range of motion.  Skin:    General: Skin is warm.  Neurological:     General: No focal deficit present.     Mental Status: He is alert.     Comments: CN 2- 12 intact. Nl strength and sensation throughout, nl finger to nose bilaterally, nl gait   Psychiatric:        Mood and Affect: Mood normal.        Behavior: Behavior normal.     ED Results / Procedures / Treatments   Labs (all labs ordered are listed, but only abnormal results are displayed) Labs Reviewed  COMPREHENSIVE METABOLIC PANEL - Abnormal; Notable for the following components:      Result Value   Glucose, Bld 131 (*)    All other components within normal limits  URINALYSIS, ROUTINE W REFLEX MICROSCOPIC - Abnormal; Notable for the following components:   Color, Urine STRAW (*)    Hgb urine dipstick SMALL (*)    All other components within normal limits  I-STAT CHEM 8, ED - Abnormal; Notable for the following components:   Glucose, Bld 124 (*)    All other components within normal limits  CBC WITH DIFFERENTIAL/PLATELET  TROPONIN I (HIGH SENSITIVITY)  TROPONIN I (HIGH SENSITIVITY)    EKG EKG Interpretation  Date/Time:  Wednesday July 28 2019 08:35:05 EDT Ventricular Rate:  62 PR Interval:    QRS Duration: 105 QT Interval:  411 QTC Calculation: 418 R Axis:   -49 Text Interpretation: Sinus rhythm Left anterior fascicular block Abnormal R-wave progression, late transition No significant change since last tracing Confirmed by  Wandra Arthurs [phone]) on 07/28/2019 8:48:46 AM   Radiology CT Angio Head W or Wo Contrast  Result Date: 07/28/2019 CLINICAL DATA:  Acute onset dizziness, elevated blood pressure EXAM: CT ANGIOGRAPHY HEAD AND NECK TECHNIQUE: Multidetector CT imaging of the head and neck was performed using the standard protocol during bolus administration of intravenous contrast. Multiplanar CT image reconstructions and MIPs were obtained to evaluate the vascular anatomy. Carotid stenosis measurements (when applicable) are obtained utilizing NASCET criteria, using the distal internal carotid diameter as the denominator. CONTRAST:  7mL OMNIPAQUE IOHEXOL 350 MG/ML SOLN COMPARISON:  2019 FINDINGS: CT HEAD FINDINGS Brain: There is no acute intracranial hemorrhage, mass effect, or edema. No acute appearing loss of gray differentiation. There are multiple chronic infarcts including involvement of the right basal ganglia and adjacent white matter, left parasagittal pons, and left frontal lobe involving subcortical white matter and corona radiata. Additional patchy hypoattenuation in the supratentorial white matter is nonspecific but may reflect mild chronic microvascular ischemic changes. The ventricles are stable in size.  No extra-axial fluid collection. Stable appearance of soft tissue density within the right frontal horn near the foramen of Monro likely reflecting a subependymoma. Vascular: Stable calcification along the right MCA bifurcation. No new hyperdense vessel. Skull: Unremarkable. Sinuses: No acute abnormality. Orbits: No acute abnormality. Review of the MIP images confirms the above findings CTA NECK FINDINGS Aortic arch: Great vessel origins are patent. Right carotid system: Patent.  No measurable stenosis. Left carotid system: Patent. There is mild noncalcified plaque at the ICA origin causing minimal stenosis. Vertebral arteries: Patent and codominant. Skeleton: Postoperative changes of anterior fusion at C4-C6.  Cervical spine degenerative changes. Other neck: No mass or adenopathy. Upper chest: No apical lung mass. Review of the MIP images confirms the above findings CTA HEAD FINDINGS Anterior circulation: Intracranial internal carotid arteries are  patent with mild calcified plaque. Anterior and middle cerebral arteries are patent. Posterior circulation: Intracranial vertebral arteries, basilar artery, and posterior cerebral arteries are patent Venous sinuses: As permitted by contrast timing, patent. Review of the MIP images confirms the above findings IMPRESSION: No acute intracranial abnormality. Several chronic infarcts. 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 No large vessel occlusion, hemodynamically significant stenosis, or evidence of dissection. Electronically Signed   By: Macy Mis M.D.   On: 07/28/2019 10:25   CT Angio Neck W and/or Wo Contrast  Result Date: 07/28/2019 CLINICAL DATA:  Acute onset dizziness, elevated blood pressure EXAM: CT ANGIOGRAPHY HEAD AND NECK TECHNIQUE: Multidetector CT imaging of the head and neck was performed using the standard protocol during bolus administration of intravenous contrast. Multiplanar CT image reconstructions and MIPs were obtained to evaluate the vascular anatomy. Carotid stenosis measurements (when applicable) are obtained utilizing NASCET criteria, using the distal internal carotid diameter as the denominator. CONTRAST:  79mL OMNIPAQUE IOHEXOL 350 MG/ML SOLN COMPARISON:  2019 FINDINGS: CT HEAD FINDINGS Brain: There is no acute intracranial hemorrhage, mass effect, or edema. No acute appearing loss of gray differentiation. There are multiple chronic infarcts including involvement of the right basal ganglia and adjacent white matter, left parasagittal pons, and left frontal lobe involving subcortical white matter and corona radiata. Additional patchy hypoattenuation in the supratentorial white matter is nonspecific but may reflect mild  chronic microvascular ischemic changes. The ventricles are stable in size.  No extra-axial fluid collection. Stable appearance of soft tissue density within the right frontal horn near the foramen of Monro likely reflecting a subependymoma. Vascular: Stable calcification along the right MCA bifurcation. No new hyperdense vessel. Skull: Unremarkable. Sinuses: No acute abnormality. Orbits: No acute abnormality. Review of the MIP images confirms the above findings CTA NECK FINDINGS Aortic arch: Great vessel origins are patent. Right carotid system: Patent.  No measurable stenosis. Left carotid system: Patent. There is mild noncalcified plaque at the ICA origin causing minimal stenosis. Vertebral arteries: Patent and codominant. Skeleton: Postoperative changes of anterior fusion at C4-C6. Cervical spine degenerative changes. Other neck: No mass or adenopathy. Upper chest: No apical lung mass. Review of the MIP images confirms the above findings CTA HEAD FINDINGS Anterior circulation: Intracranial internal carotid arteries are patent with mild calcified plaque. Anterior and middle cerebral arteries are patent. Posterior circulation: Intracranial vertebral arteries, basilar artery, and posterior cerebral arteries are patent Venous sinuses: As permitted by contrast timing, patent. Review of the MIP images confirms the above findings IMPRESSION: No acute intracranial abnormality. Several chronic infarcts. 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 No large vessel occlusion, hemodynamically significant stenosis, or evidence of dissection. Electronically Signed   By: Macy Mis M.D.   On: 07/28/2019 10:25   MR BRAIN WO CONTRAST  Result Date: 07/28/2019 CLINICAL DATA:  Dizziness.  Rule out stroke EXAM: MRI HEAD WITHOUT CONTRAST TECHNIQUE: Multiplanar, multiecho pulse sequences of the brain and surrounding structures were obtained without intravenous contrast. COMPARISON:  CT angio head neck  07/28/2019 FINDINGS: Brain: Negative for acute infarct Generalized atrophy. Moderate chronic microvascular ischemic changes in the white matter. Chronic hemorrhagic infarct in the deep white matter on the right. Patchy hyperintensity in the pons consistent with chronic microvascular ischemia. 13 mm lobulated mass in the right frontal horn unchanged from the prior MRI. This lesion did not enhance on prior MRI. Vascular: Normal arterial flow voids. Skull and upper cervical spine: Negative for skeletal lesion. Sinuses/Orbits: Mild mucosal edema paranasal sinuses.  Negative orbit Other: None IMPRESSION: Negative for acute infarct Moderate chronic microvascular ischemic changes. Stable lobular mass right frontal horn possible subependymoma Electronically Signed   By: Franchot Gallo M.D.   On: 07/28/2019 10:49   DG Chest Port 1 View  Result Date: 07/28/2019 CLINICAL DATA:  Shortness of breath weakness EXAM: PORTABLE CHEST 1 VIEW COMPARISON:  11/03/2018 FINDINGS: Cardiomediastinal contours are stable, accentuated by portable technique. Hilar structures are unremarkable. Left hemidiaphragm is partially obscured. Visualized skeletal structures without acute process. Evidence of prior cervical spinal fusion incompletely imaged. IMPRESSION: Left basilar airspace disease likely atelectasis. Electronically Signed   By: Zetta Bills M.D.   On: 07/28/2019 09:22    Procedures Procedures (including critical care time)  Medications Ordered in ED Medications  iohexol (OMNIPAQUE) 350 MG/ML injection 75 mL (75 mLs Intravenous Contrast Given 07/28/19 1000)  LORazepam (ATIVAN) injection 1 mg (1 mg Intravenous Given 07/28/19 1003)    ED Course  I have reviewed the triage vital signs and the nursing notes.  Pertinent labs & imaging results that were available during my care of the patient were reviewed by me and considered in my medical decision making (see chart for details).    MDM Rules/Calculators/A&P                       JAHVIER WALSTON is a 74 y.o. male here with dizziness and neck pain.  Patient had a history of stroke previously.  Consider posterior circulation stroke versus peripheral vertigo versus dissection.  Will get CTA head and neck and MRI brain.   12:18 PM Labs unremarkable.  CTA showed no dissection.  MRI showed no stroke.  Felt better in the ED.  Troponin negative x2.  Stable for discharge.  Final Clinical Impression(s) / ED Diagnoses Final diagnoses:  None    Rx / DC Orders ED Discharge Orders    None       Drenda Freeze, MD 07/28/19 1219

## 2019-07-28 NOTE — ED Notes (Signed)
Provided pt with cup of water for fluid challenge. Pt drinking steadily and tolerating well.

## 2019-07-28 NOTE — ED Triage Notes (Addendum)
Pt arrives to ED from home with complaints of acute onset of dizziness and diaphoresis this morning. Patient states that he just felt "off" this morning. Patient states that his blood pressure was elevated to the [id number] systolic.

## 2019-07-28 NOTE — ED Notes (Signed)
Pt transported to CT ?

## 2019-07-28 NOTE — Telephone Encounter (Signed)
Eugene Spark, MD  Jacqualine Mau T 5 hours ago (8:55 AM)   Please add hydrochlorothiazide 25 mg daily.   Pt is aware to start HCTZ 25 mg po daily. Pt aware that this prescription was sent to his pharmacy on file.  Pt made aware via mychart message.  Pt verbalized understanding and agrees with this plan.

## 2019-08-17 ENCOUNTER — Encounter: Payer: Self-pay | Admitting: Cardiology

## 2019-08-17 ENCOUNTER — Other Ambulatory Visit: Payer: Self-pay

## 2019-08-17 ENCOUNTER — Ambulatory Visit: Payer: Medicare PPO | Admitting: Cardiology

## 2019-08-17 VITALS — BP 142/80 | HR 66 | Ht 70.0 in | Wt 252.6 lb

## 2019-08-17 DIAGNOSIS — I10 Essential (primary) hypertension: Secondary | ICD-10-CM

## 2019-08-17 DIAGNOSIS — E785 Hyperlipidemia, unspecified: Secondary | ICD-10-CM | POA: Diagnosis not present

## 2019-08-17 DIAGNOSIS — Z952 Presence of prosthetic heart valve: Secondary | ICD-10-CM | POA: Diagnosis not present

## 2019-08-17 MED ORDER — SPIRONOLACTONE 25 MG PO TABS: 12.5000 mg | ORAL_TABLET | Freq: Every day | ORAL | 3 refills | Status: DC

## 2019-08-17 NOTE — Progress Notes (Signed)
Cardiology Office Note:    Date:  08/17/2019   ID:  [name], DOB [birth year], MRN [id number]  PCP:  Cyndi Bender, PA-C  Cardiologist:  Ena Dawley, MD  Electrophysiologist:  None   Referring MD: Cyndi Bender, PA-C   Reason for visit: 6 months follow-up  History of Present Illness:    [name] is a 74 y.o. male with a hx of stroke, HTN, HLD, OSA on CPAP, seizures, skin cancer and severe AS s/p TAVR (11/03/18) who presents for follow up.   Catheterization performed October 21, 2018 confirmed the presence of severe aortic stenosis with peak to peak and mean transvalvular gradients measured 50 and 44.2 mmHg respectively. There was mild nonobstructive coronary artery disease and right heart pressures were normal.  The patient was evaluated by the multidisciplinary valve team and felt to have severe, symptomatic aortic stenosis. He underwent successful TAVR with a 26 mm Edwards Sapien 3 THV via the TF approach on 11/03/18. Post op echo showed EF 65% with normally functioning TAVR with mean gradient moderately elevated at 28 mm Hg and trivial PVL. He was discharged on POD 1 on Aspirin and plavix.   The patient was attending cardiac rehab last fall and has been feeling very well until recently when he went to the ER for vertigo, overall not feeling well, and was diagnosed with hypertensive urgency.  The patient states that he got his second Covid vaccine about a week prior to that incident.  He was prescribed meclizine and is feeling slightly better.  He states that his blood pressure continues to be in the 140s.  He denies any chest pain or shortness of breath.  He can walk without any symptoms denies any palpitations or falls.    Past Medical History:  Diagnosis Date  . Aortic stenosis, severe   . Arthritis    "hands" (02/04/2018)  . Basal cell carcinoma    "MOHS surgery on sides of my nose" (02/04/2018)  . Chronic lower back pain   . CVA (cerebral vascular accident) ([place])  01/29/2018  . CVA (cerebral vascular accident) ([place]) 02/01/2018  . Diarrhea   . Difficulty urinating   . Heart murmur    no problems  . High cholesterol   . Hypertension   . OSA on CPAP    "just got CPAP today" (02/04/2018)  . Rectal bleeding   . Rectal pain   . S/P TAVR (transcatheter aortic valve replacement) 11/03/2018   26 mm Edwards Sapien 3 transcatheter heart valve placed via percutaneous right transfemoral approach   . Stroke [facility])    "we were told that MRI showed previous stroke we were not aware that he had" (02/04/2018)    Past Surgical History:  Procedure Laterality Date  . ANTERIOR CERVICAL DECOMP/DISCECTOMY FUSION  2009  . BACK SURGERY  1980's  . COLONOSCOPY     x 2  . EXCISIONAL HEMORRHOIDECTOMY  1989 -2011 X 2 or 3  . LAPAROSCOPIC CHOLECYSTECTOMY  2005  . LUMBAR DISC SURGERY  1980s X 2  . MOHS SURGERY Bilateral 2006   "sides of my nose" x 2  . RIGHT/LEFT HEART CATH AND CORONARY ANGIOGRAPHY N/A 10/21/2018   Procedure: RIGHT/LEFT HEART CATH AND CORONARY ANGIOGRAPHY;  Surgeon: Burnell Blanks, MD;  Location: [place] CV LAB;  Service: Cardiovascular;  Laterality: N/A;  . TEE WITHOUT CARDIOVERSION N/A 02/03/2018   Procedure: TRANSESOPHAGEAL ECHOCARDIOGRAM (TEE);  Surgeon: Lelon Perla, MD;  Location: [facility] ENDOSCOPY;  Service: Cardiovascular;  Laterality: N/A;  .  TEE WITHOUT CARDIOVERSION N/A 11/03/2018   Procedure: TRANSESOPHAGEAL ECHOCARDIOGRAM (TEE);  Surgeon: Burnell Blanks, MD;  Location: [place] CV LAB;  Service: Open Heart Surgery;  Laterality: N/A;  . TRANSCATHETER AORTIC VALVE REPLACEMENT, TRANSFEMORAL N/A 11/03/2018   Procedure: TRANSCATHETER AORTIC VALVE REPLACEMENT, TRANSFEMORAL;  Surgeon: Burnell Blanks, MD;  Location: [place] CV LAB;  Service: Open Heart Surgery;  Laterality: N/A;  . WISDOM TOOTH EXTRACTION      Current Medications: Current Meds  Medication Sig  . acetaminophen (TYLENOL) 650 MG CR tablet Take 650 mg  by mouth every 8 (eight) hours as needed.  Marland Kitchen amoxicillin (AMOXIL) 500 MG tablet Take 4 pills (2,000 mg) one hour prior to all dental visits. There are enough for TWO visits in this bottle.  Marland Kitchen aspirin EC 81 MG EC tablet Take 1 tablet (81 mg total) by mouth daily.  . carvedilol (COREG) 3.125 MG tablet TAKE 1 TABLET BY MOUTH TWICE A DAY  . dutasteride (AVODART) 0.5 MG capsule Take 0.5 mg by mouth daily.  . hydrochlorothiazide (HYDRODIURIL) 25 MG tablet Take 1 tablet (25 mg total) by mouth daily.  . hydrocortisone (ANUSOL-HC) 25 MG suppository Place 25 mg rectally 2 (two) times daily as needed for hemorrhoids or anal itching.  . losartan (COZAAR) 100 MG tablet Take 1 tablet (100 mg total) by mouth daily.  . meclizine (ANTIVERT) 12.5 MG tablet Take 1 tablet (12.5 mg total) by mouth 3 (three) times daily as needed for dizziness.  . rosuvastatin (CRESTOR) 20 MG tablet Take 1 tablet by mouth daily  . tamsulosin (FLOMAX) 0.4 MG CAPS capsule Take 0.4 mg by mouth daily.  Marland Kitchen tetrahydrozoline 0.05 % ophthalmic solution Place 1-2 drops into both eyes 3 (three) times daily as needed (dry/irritated eyes.).     Allergies:   Atorvastatin   Social History   Socioeconomic History  . Marital status: Married    Spouse name: Not on file  . Number of children: Not on file  . Years of education: Not on file  . Highest education level: Not on file  Occupational History  . Occupation: retired  Tobacco Use  . Smoking status: Never Smoker  . Smokeless tobacco: Never Used  Substance and Sexual Activity  . Alcohol use: Not Currently    Alcohol/week: 0.0 standard drinks  . Drug use: Not Currently  . Sexual activity: Not Currently  Other Topics Concern  . Not on file  Social History Narrative   Lives with his wife in [place], [place]   Social Determinants of Health   Financial Resource Strain:   . Difficulty of Paying Living Expenses:   Food Insecurity:   . Worried About Charity fundraiser in the Last Year:    . Arboriculturist in the Last Year:   Transportation Needs:   . Film/video editor (Medical):   Marland Kitchen Lack of Transportation (Non-Medical):   Physical Activity:   . Days of Exercise per Week:   . Minutes of Exercise per Session:   Stress:   . Feeling of Stress :   Social Connections:   . Frequency of Communication with Friends and Family:   . Frequency of Social Gatherings with Friends and Family:   . Attends Religious Services:   . Active Member of Clubs or Organizations:   . Attends Archivist Meetings:   Marland Kitchen Marital Status:      Family History: The patient's family history includes Cancer in his mother; Heart disease in his father.  There is no history of Stroke.  ROS:   Please see the history of present illness.    All other systems reviewed and are negative.  EKGs/Labs/Other Studies Reviewed:    The following studies were reviewed today:  EKG:  EKG is ordered today.  The ekg ordered today demonstrates is rhythm, nonspecific ST-T wave abnormalities, these are unchanged from prior.  This was personally reviewed.  Recent Labs: 10/30/2018: B Natriuretic Peptide 36.0 11/04/2018: Magnesium 2.0 07/28/2019: ALT 21; BUN 16; Creatinine, Ser 0.90; Hemoglobin 16.0; Platelets 177; Potassium 4.2; Sodium 140  Recent Lipid Panel    Component Value Date/Time   CHOL 118 04/06/2018 0813   TRIG 175 (H) 04/06/2018 0813   HDL 38 (L) 04/06/2018 0813   CHOLHDL 3.1 04/06/2018 0813   CHOLHDL 6.9 01/30/2018 0347   VLDL 64 (H) 01/30/2018 0347   LDLCALC 45 04/06/2018 0813    Physical Exam:    VS:  BP (!) 142/80   Pulse 66   Ht 5\' 10"  (1.778 m)   Wt 252 lb 9.6 oz (114.6 kg)   SpO2 96%   BMI 36.24 kg/m     Wt Readings from Last 3 Encounters:  08/17/19 252 lb 9.6 oz (114.6 kg)  07/28/19 250 lb (113.4 kg)  01/26/19 253 lb (114.8 kg)     GEN:  Well nourished, well developed in no acute distress HEENT: Normal NECK: No JVD; No carotid bruits LYMPHATICS: No  lymphadenopathy CARDIAC: RRR, 2 out of 6 diastolic murmurs, rubs, gallops RESPIRATORY:  Clear to auscultation without rales, wheezing or rhonchi  ABDOMEN: Soft, non-tender, non-distended MUSCULOSKELETAL:  No edema; No deformity  SKIN: Warm and dry NEUROLOGIC:  Alert and oriented x 3 PSYCHIATRIC:  Normal affect   TTE: 11/25/2018  LVEF 60 to 65%, RVEF normal, left atrial dilatation, status post TAVR with a 26 mm Edwards SAPIEN valve with mild paravalvular leak and normal transaortic gradients.  30 mmHg, mean gradient 17 mmHg.   Cardiac Cath: /617/2020   Mid RCA lesion is 40% stenosed.  Ost Cx lesion is 20% stenosed.  Mid Cx lesion is 20% stenosed.  Dist LAD lesion is 20% stenosed.   1. Mild non-obstructive CAD 2. Severe aortic stenosis (mean gradient 44.2 mmHg, peak to peak gradient 50 mmHg)  Recommendations: Will continue workup for TAVR.   ASSESSMENT:    1. Essential hypertension   2. S/P TAVR (transcatheter aortic valve replacement)   3. Hyperlipidemia, unspecified hyperlipidemia type    PLAN:    In order of problems listed above:  Severe AS s/p TAVR: doing well. Continue Asprin and plavix.  Post op echo showed EF 65% with normally functioning TAVR with mean gradient 17 mmHg and mild paravalvular leak.  He has repeat echo scheduled for June 2021 as well as follow-up in the structural heart clinic The patient is asymptomatic, he is advised to continue exercising 4-5 times a week.  HTN: Uncontrolled.  I will add spironolactone 12.5 mg daily, his most recent creatinine and potassium were normal, will follow him up in our blood pressure clinic in 2 weeks.  Follow-up with me in 6 months.  HLD: continue statin, he is tolerating it well.  Hx of CVA: continue aspirin and plavix.  Pulmonary nodule: patient is a never smoker and low risk. No follow up recommended .  COVID-19 Education: The signs and symptoms of COVID-19 were discussed with the patient and how to seek care  for testing (follow up with PCP or arrange E-visit).  The importance  of social distancing was discussed today.   Medication Adjustments/Labs and Tests Ordered: Current medicines are reviewed at length with the patient today.  Concerns regarding medicines are outlined above.  Orders Placed This Encounter  Procedures  . EKG 12-Lead   Meds ordered this encounter  Medications  . spironolactone (ALDACTONE) 25 MG tablet    Sig: Take 0.5 tablets (12.5 mg total) by mouth daily.    Dispense:  45 tablet    Refill:  3    Patient Instructions  Medication Instructions:   START TAKING SPIRONOLACTONE 12.5 MG BY MOUTH DAILY  *If you need a refill on your cardiac medications before your next appointment, please call your pharmacy*   You have been referred to [place] 2 WEEKS TO SEE OUR PHARMACIST   Follow-Up:  IN 2 WEEKS TO SEE OUR PHARMACIST IN HYPERTENSION CLINIC  At [facility], you and your health needs are our priority.  As part of our continuing mission to provide you with exceptional heart care, we have created designated Provider Care Teams.  These Care Teams include your primary Cardiologist (physician) and Advanced Practice Providers (APPs -  Physician Assistants and Nurse Practitioners) who all work together to provide you with the care you need, when you need it.  We recommend signing up for the patient portal called "MyChart".  Sign up information is provided on this After Visit Summary.  MyChart is used to connect with patients for Virtual Visits (Telemedicine).  Patients are able to view lab/test results, encounter notes, upcoming appointments, etc.  Non-urgent messages can be sent to your provider as well.   To learn more about what you can do with MyChart, go to [email or web address].    Your next appointment:   6 month(s)  The format for your next appointment:   In Person  Provider:   Ena Dawley, MD        Signed, Ena Dawley, MD   08/17/2019 12:51 PM    [place]

## 2019-08-17 NOTE — Patient Instructions (Signed)
Medication Instructions:   START TAKING SPIRONOLACTONE 12.5 MG BY MOUTH DAILY  *If you need a refill on your cardiac medications before your next appointment, please call your pharmacy*   You have been referred to [place] 2 WEEKS TO SEE OUR PHARMACIST   Follow-Up:  IN 2 WEEKS TO SEE OUR PHARMACIST IN HYPERTENSION CLINIC  At [facility], you and your health needs are our priority.  As part of our continuing mission to provide you with exceptional heart care, we have created designated Provider Care Teams.  These Care Teams include your primary Cardiologist (physician) and Advanced Practice Providers (APPs -  Physician Assistants and Nurse Practitioners) who all work together to provide you with the care you need, when you need it.  We recommend signing up for the patient portal called "MyChart".  Sign up information is provided on this After Visit Summary.  MyChart is used to connect with patients for Virtual Visits (Telemedicine).  Patients are able to view lab/test results, encounter notes, upcoming appointments, etc.  Non-urgent messages can be sent to your provider as well.   To learn more about what you can do with MyChart, go to [email or web address].    Your next appointment:   6 month(s)  The format for your next appointment:   In Person  Provider:   Ena Dawley, MD

## 2019-08-19 DIAGNOSIS — H2513 Age-related nuclear cataract, bilateral: Secondary | ICD-10-CM | POA: Diagnosis not present

## 2019-08-30 NOTE — Progress Notes (Signed)
Patient ID: [name]                 DOB: 12-25-1945                      MRN: [id number]     HPI: [name] is a 74 y.o. male referred by Dr. Meda Anderson to HTN clinic. Pt previously seen by Dr. Fuller Anderson in January 2021 for lipid management. PMH is significant for stroke 01/29/18, severe AS s/p TAVR 11/03/18, mild nonobstructive CAD, HTN, HLD, OSA on CPAP, seizures, and skin cancer. He went to the ED on 07/28/19 with vertigo and overall not feeling well. He was diagnosed with hypertensive urgency. He had received his 2nd COVID vaccine 1 week prior. Discharged with a prescription for meclizine and felt slightly better afterwards. He followed up with Dr [name] on 08/17/19. Reported home systolic BP in the 0000000 and clinic BP was 142/80. He was started on spironolactone 12.5mg  daily and presents today for follow up.   Patient reports tolerating recent initiation of spironolactone. States he notices he gets a little "woozy" when he stands up, but this is normal for him. He does wake up multiple times throughout the night to urinate. Denies chest pain/headaches/weakness. He reports taking all of his BP medications today. Patient did not bring BP log today. However, he does remember how BP readings typically trend.  Current HTN meds: carvedilol 3.125mg  BID (5-7 AM, 7PM), HCTZ 25mg  daily (5-7 AM), losartan 100mg  daily (5-7 AM), spironolactone 12.5mg  daily (12 PM)  BP goal: <130/80 mmHg  Family History: Father with history of heart disease.  Social History: Denies tobacco and alcohol use   Diet: egg, yogurt, wife cooks dinner-does cook with salt (doesn't add much salt), doesn't drinkcaffeine, diet sprit, sandwich with pimento cheese, snacks on M&M, peanut butter crackers, pistachio nuts -Frozen food: denies -Fast food: 1x every 2 weeks -Canned vegetables: yes (rinses with water)  Exercise: Walks 1-2 miles in the afternoon (unless its too hot) mostly everyday. Does not go to Cardiac Rehab anymore.  Reports he works outside and does a lot of yard work/garden.  Home BP readings (bicep cuff):  120-130/60 mmHg with a pulse ~60s per patient report  Wt Readings from Last 3 Encounters:  08/17/19 252 lb 9.6 oz (114.6 kg)  07/28/19 250 lb (113.4 kg)  01/26/19 253 lb (114.8 kg)   BP Readings from Last 3 Encounters:  08/17/19 (!) 142/80  07/28/19 (!) 146/63  01/26/19 128/61   Pulse Readings from Last 3 Encounters:  08/17/19 66  07/28/19 (!) 57  01/26/19 62    Renal function: CrCl cannot be calculated (Patient's most recent lab result is older than the maximum 21 days allowed.).  Past Medical History:  Diagnosis Date  . Aortic stenosis, severe   . Arthritis    "hands" (02/04/2018)  . Basal cell carcinoma    "MOHS surgery on sides of my nose" (02/04/2018)  . Chronic lower back pain   . CVA (cerebral vascular accident) ([name]) 01/29/2018  . CVA (cerebral vascular accident) ([name]) 02/01/2018  . Diarrhea   . Difficulty urinating   . Heart murmur    no problems  . High cholesterol   . Hypertension   . OSA on CPAP    "just got CPAP today" (02/04/2018)  . Rectal bleeding   . Rectal pain   . S/P TAVR (transcatheter aortic valve replacement) 11/03/2018   26 mm Edwards Sapien 3 transcatheter heart valve placed  via percutaneous right transfemoral approach   . Stroke [name])    "we were told that MRI showed previous stroke we were not aware that he had" (02/04/2018)    Current Outpatient Medications on File Prior to Visit  Medication Sig Dispense Refill  . acetaminophen (TYLENOL) 650 MG CR tablet Take 650 mg by mouth every 8 (eight) hours as needed.    Marland Kitchen amoxicillin (AMOXIL) 500 MG tablet Take 4 pills (2,000 mg) one hour prior to all dental visits. There are enough for TWO visits in this bottle. 8 tablet 8  . aspirin EC 81 MG EC tablet Take 1 tablet (81 mg total) by mouth daily. 30 tablet 3  . carvedilol (COREG) 3.125 MG tablet TAKE 1 TABLET BY MOUTH TWICE A DAY 180 tablet 2  .  dutasteride (AVODART) 0.5 MG capsule Take 0.5 mg by mouth daily.  3  . hydrochlorothiazide (HYDRODIURIL) 25 MG tablet Take 1 tablet (25 mg total) by mouth daily. 90 tablet 0  . hydrocortisone (ANUSOL-HC) 25 MG suppository Place 25 mg rectally 2 (two) times daily as needed for hemorrhoids or anal itching.    . losartan (COZAAR) 100 MG tablet Take 1 tablet (100 mg total) by mouth daily. 90 tablet 3  . meclizine (ANTIVERT) 12.5 MG tablet Take 1 tablet (12.5 mg total) by mouth 3 (three) times daily as needed for dizziness. 10 tablet 0  . rosuvastatin (CRESTOR) 20 MG tablet Take 1 tablet by mouth daily 30 tablet 11  . spironolactone (ALDACTONE) 25 MG tablet Take 0.5 tablets (12.5 mg total) by mouth daily. 45 tablet 3  . tamsulosin (FLOMAX) 0.4 MG CAPS capsule Take 0.4 mg by mouth daily.    Marland Kitchen tetrahydrozoline 0.05 % ophthalmic solution Place 1-2 drops into both eyes 3 (three) times daily as needed (dry/irritated eyes.).     No current facility-administered medications on file prior to visit.    Allergies  Allergen Reactions  . Atorvastatin Other (See Comments)    Pt reports causes muscle aches, constipation, and GI issues.       Assessment/Plan:  1. Hypertension - BP goal < 130/80 mmHg; therefore, patient is at goal likely due to diet modifications (cutting out salt (sausage, doesn't add salt to food) and recent initiation of spironolactone. Continue carvedilol 3.125mg  BID, HCTZ 25mg  daily, losartan 100mg  daily, spironolactone 12.5mg  daily. He confirms he does not need refills at the moment. Advised patient to split up BP medications to help him tolerate BP meds and decrease "wooziness" - take spironolactone/HCTZ 1st dose of carvedilol in the AM and losartan/2nd dose of carvedilol in the evening. Also, stressed taking spironolactone as early as possible in the day to help decrease urination throughout the night (also explained urination could be due to prostate / DM). Patient obtained BMET today.  Depending on Scr/K results may have to adjust BP medication regimen. Will follow up with patient tomorrow (08/31/19) to discuss BMET results and re-assess BP medication regimen. Patient verbalized understanding.  Thank you for involving pharmacy to assist in providing this patient's care.   Drexel Iha, PharmD PGY2 Ambulatory Care Pharmacy Resident

## 2019-08-30 NOTE — Progress Notes (Deleted)
Patient ID: [name]                 DOB: 11-19-1945                      MRN: [id number]     HPI: [name] is a 74 y.o. male referred by Dr. Meda Coffee to HTN clinic. Pt previously seen by me in January 2021 for lipid management. PMH is significant for stroke 01/29/18, severe AS s/p TAVR 11/03/18, mild nonobstructive CAD, HTN, HLD, OSA on CPAP, seizures, and skin cancer. He went to the ED on 07/28/19 with vertigo and overall not feeling well. He was diagnosed with hypertensive urgency. He had received his 2nd COVID vaccine 1 week prior. Discharged with a prescription for meclizine and felt slightly better afterwards. He followed up with Dr Meda Coffee on 08/17/19. Reported home systolic BP in the 0000000 and clinic BP was 142/80. He was started on spironolactone 12.5mg  daily and presents today for follow up.  bmet today Combine losartan hctz to combo pill if pt prefers Inc spiro if needed - labs stable 3/24  Ask if any diet exercise updates since last seen by me in january  Current HTN meds: carvedilol 3.125mg  BID, HCTZ 25mg  daily, losartan 100mg  daily, spironolactone 12.5mg  daily   BP goal: <130/49mmHg  Family History: Father with history of heart disease.  Social History: Denies tobacco and alcohol use.  Diet: egg sausage, yogurt, wife cooks dinner-does cook with salt, doesn't drinkcaffeine, diet sprit, sandwich with pimento cheese, snacks on M&M, peanut butter crackers, potato sticks  Exercise: Walks 1-2 miles in the afternoon (unless its too hot) mostly everyday. 01/2019 started going to start cardiac rehab at [facility] three days a week. He does 30 min on the treadmill with incline of 2.5 and speed of 2.5.   Home BP readings:   Wt Readings from Last 3 Encounters:  08/17/19 252 lb 9.6 oz (114.6 kg)  07/28/19 250 lb (113.4 kg)  01/26/19 253 lb (114.8 kg)   BP Readings from Last 3 Encounters:  08/17/19 (!) 142/80  07/28/19 (!) 146/63  01/26/19 128/61   Pulse Readings from Last 3  Encounters:  08/17/19 66  07/28/19 (!) 57  01/26/19 62    Renal function: CrCl cannot be calculated (Patient's most recent lab result is older than the maximum 21 days allowed.).  Past Medical History:  Diagnosis Date  . Aortic stenosis, severe   . Arthritis    "hands" (02/04/2018)  . Basal cell carcinoma    "MOHS surgery on sides of my nose" (02/04/2018)  . Chronic lower back pain   . CVA (cerebral vascular accident) ([place]) 01/29/2018  . CVA (cerebral vascular accident) ([place]) 02/01/2018  . Diarrhea   . Difficulty urinating   . Heart murmur    no problems  . High cholesterol   . Hypertension   . OSA on CPAP    "just got CPAP today" (02/04/2018)  . Rectal bleeding   . Rectal pain   . S/P TAVR (transcatheter aortic valve replacement) 11/03/2018   26 mm Edwards Sapien 3 transcatheter heart valve placed via percutaneous right transfemoral approach   . Stroke [facility])    "we were told that MRI showed previous stroke we were not aware that he had" (02/04/2018)    Current Outpatient Medications on File Prior to Visit  Medication Sig Dispense Refill  . acetaminophen (TYLENOL) 650 MG CR tablet Take 650 mg by mouth every 8 (eight)  hours as needed.    Marland Kitchen amoxicillin (AMOXIL) 500 MG tablet Take 4 pills (2,000 mg) one hour prior to all dental visits. There are enough for TWO visits in this bottle. 8 tablet 8  . aspirin EC 81 MG EC tablet Take 1 tablet (81 mg total) by mouth daily. 30 tablet 3  . carvedilol (COREG) 3.125 MG tablet TAKE 1 TABLET BY MOUTH TWICE A DAY 180 tablet 2  . dutasteride (AVODART) 0.5 MG capsule Take 0.5 mg by mouth daily.  3  . hydrochlorothiazide (HYDRODIURIL) 25 MG tablet Take 1 tablet (25 mg total) by mouth daily. 90 tablet 0  . hydrocortisone (ANUSOL-HC) 25 MG suppository Place 25 mg rectally 2 (two) times daily as needed for hemorrhoids or anal itching.    . losartan (COZAAR) 100 MG tablet Take 1 tablet (100 mg total) by mouth daily. 90 tablet 3  . meclizine  (ANTIVERT) 12.5 MG tablet Take 1 tablet (12.5 mg total) by mouth 3 (three) times daily as needed for dizziness. 10 tablet 0  . rosuvastatin (CRESTOR) 20 MG tablet Take 1 tablet by mouth daily 30 tablet 11  . spironolactone (ALDACTONE) 25 MG tablet Take 0.5 tablets (12.5 mg total) by mouth daily. 45 tablet 3  . tamsulosin (FLOMAX) 0.4 MG CAPS capsule Take 0.4 mg by mouth daily.    Marland Kitchen tetrahydrozoline 0.05 % ophthalmic solution Place 1-2 drops into both eyes 3 (three) times daily as needed (dry/irritated eyes.).     No current facility-administered medications on file prior to visit.    Allergies  Allergen Reactions  . Atorvastatin Other (See Comments)    Pt reports causes muscle aches, constipation, and GI issues.       Assessment/Plan:  1. Hypertension -

## 2019-08-31 ENCOUNTER — Other Ambulatory Visit: Payer: Self-pay

## 2019-08-31 ENCOUNTER — Other Ambulatory Visit: Payer: Medicare PPO

## 2019-08-31 ENCOUNTER — Ambulatory Visit (INDEPENDENT_AMBULATORY_CARE_PROVIDER_SITE_OTHER): Payer: Medicare PPO | Admitting: Pharmacist

## 2019-08-31 VITALS — BP 118/60 | HR 57

## 2019-08-31 DIAGNOSIS — I10 Essential (primary) hypertension: Secondary | ICD-10-CM | POA: Diagnosis not present

## 2019-08-31 LAB — BASIC METABOLIC PANEL
BUN/Creatinine Ratio: 15 (ref 10–24)
BUN: 15 mg/dL (ref 8–27)
CO2: 26 mmol/L (ref 20–29)
Calcium: 9.4 mg/dL (ref 8.6–10.2)
Chloride: 100 mmol/L (ref 96–106)
Creatinine, Ser: 0.97 mg/dL (ref 0.76–1.27)
GFR calc Af Amer: 90 mL/min/{1.73_m2} (ref >59)
GFR calc non Af Amer: 78 mL/min/{1.73_m2} (ref >59)
Glucose: 130 mg/dL — ABNORMAL HIGH (ref 65–99)
Potassium: 4.1 mmol/L (ref 3.5–5.2)
Sodium: 140 mmol/L (ref 134–144)

## 2019-08-31 NOTE — Patient Instructions (Addendum)
It was a pleasure seeing you in clinic today Mr. Smoker!  Today the plan is... 1. Continue all BP meds 2. If you want you can take carvedilol, HCTZ, and spironolactone in the morning. Then take 2nd dose of carvedilol and losartan in the evening. OR you can take carvedilol/HCTZ/spironolactone/losartan in the morning and 2nd dose of carvedilol in evening  Please call the PharmD clinic at [phone] if you have any questions that you would like to speak with a pharmacist about [name], [name], [name]).

## 2019-09-01 ENCOUNTER — Telehealth: Payer: Self-pay | Admitting: Pharmacist

## 2019-09-01 NOTE — Telephone Encounter (Signed)
Advised patient that his labs are stable. He should continue taking losartan, HCTZ, spiro and carvedilol. He should try taking his losartan at night and spironolactone in the AM and should stand up/get out of bed slowly to avoid/limit dizziness.

## 2019-09-03 DIAGNOSIS — K602 Anal fissure, unspecified: Secondary | ICD-10-CM | POA: Diagnosis not present

## 2019-09-03 DIAGNOSIS — K649 Unspecified hemorrhoids: Secondary | ICD-10-CM | POA: Diagnosis not present

## 2019-09-28 DIAGNOSIS — N411 Chronic prostatitis: Secondary | ICD-10-CM | POA: Diagnosis not present

## 2019-09-28 DIAGNOSIS — E119 Type 2 diabetes mellitus without complications: Secondary | ICD-10-CM | POA: Diagnosis not present

## 2019-09-28 DIAGNOSIS — G4733 Obstructive sleep apnea (adult) (pediatric): Secondary | ICD-10-CM | POA: Diagnosis not present

## 2019-09-28 DIAGNOSIS — Z79899 Other long term (current) drug therapy: Secondary | ICD-10-CM | POA: Diagnosis not present

## 2019-09-28 DIAGNOSIS — G894 Chronic pain syndrome: Secondary | ICD-10-CM | POA: Diagnosis not present

## 2019-09-28 DIAGNOSIS — I10 Essential (primary) hypertension: Secondary | ICD-10-CM | POA: Diagnosis not present

## 2019-09-28 DIAGNOSIS — Z952 Presence of prosthetic heart valve: Secondary | ICD-10-CM | POA: Diagnosis not present

## 2019-09-28 DIAGNOSIS — E781 Pure hyperglyceridemia: Secondary | ICD-10-CM | POA: Diagnosis not present

## 2019-09-28 DIAGNOSIS — I69320 Aphasia following cerebral infarction: Secondary | ICD-10-CM | POA: Diagnosis not present

## 2019-10-06 DIAGNOSIS — Z6837 Body mass index (BMI) 37.0-37.9, adult: Secondary | ICD-10-CM | POA: Diagnosis not present

## 2019-10-06 DIAGNOSIS — H6501 Acute serous otitis media, right ear: Secondary | ICD-10-CM | POA: Diagnosis not present

## 2019-10-06 DIAGNOSIS — H9311 Tinnitus, right ear: Secondary | ICD-10-CM | POA: Diagnosis not present

## 2019-10-06 DIAGNOSIS — L570 Actinic keratosis: Secondary | ICD-10-CM | POA: Diagnosis not present

## 2019-10-11 ENCOUNTER — Other Ambulatory Visit: Payer: Self-pay

## 2019-10-11 MED ORDER — HYDROCHLOROTHIAZIDE 25 MG PO TABS: 25.0000 mg | ORAL_TABLET | Freq: Every day | ORAL | 1 refills | Status: DC

## 2019-11-01 NOTE — Progress Notes (Addendum)
[name]                                       Cardiology Office Note    Date:  11/04/2019   ID:  [name], DOB 02/09/1946, MRN [id number]  PCP:  Eugene Bender, PA-C  Cardiologist:  Dr. Meda Coffee / Dr. [name] & Dr. Roxy Manns (TAVR)  CC: 1 year s/p TAVR  History of Present Illness:  [name] is a 74 y.o. male with a history of stroke,HTN,HLD,OSAon CPAP, seizures,skin cancer and severe AS s/p TAVR (11/03/18) who presents for follow up.  Echo 09/08/18 revealedpreserved left ventricular systolic function with moderate to severe aortic stenosis Peak velocity across the aortic valve range between 3.8 and 4 m/s corresponding to mean transvalvular gradient estimated 39 mmHg. The DVI was reported 0.26 and aortic valve area calculated 1.25 cm. The patient was referred to the multidisciplinary heart valve clinic.Left and right heart catheterization performed October 21, 2018 confirmed the presence of severe aortic stenosis with peak to peak and mean transvalvular gradients measured 50 and 44.2 mmHg respectively. There was mild nonobstructive coronary artery disease and right heart pressures were normal.  The patient wasevaluated by the multidisciplinary valve team and felt to have severe, symptomatic aortic stenosis. He underwent successful TAVR with a 26 mm Edwards Sapien 3 THV via the TF approach on 11/03/18. Post op echo showed EF 65% with normally functioning TAVR with mean gradient moderately elevated at 28 mm Hg and trivial PVL. He was discharged on POD 1 on Aspirin and plavix. 1 month echo showed EF 65%, normally functioning TAVR with mean gradient 17 mm Hg (improved from 28 mm Hg post operatively) and mild PVL.  Today he presents to clinic for follow up. No CP or SOB. No LE edema, orthopnea or PND. No dizziness or syncope. No blood in stool or urine. No palpitations. Has had the Moderna vaccine.   Past Medical History:  Diagnosis  Date  . Aortic stenosis, severe   . Arthritis    "hands" (02/04/2018)  . Basal cell carcinoma    "MOHS surgery on sides of my nose" (02/04/2018)  . Chronic lower back pain   . CVA (cerebral vascular accident) ([place]) 01/29/2018  . CVA (cerebral vascular accident) ([place]) 02/01/2018  . Diarrhea   . Difficulty urinating   . Heart murmur    no problems  . High cholesterol   . Hypertension   . OSA on CPAP    "just got CPAP today" (02/04/2018)  . Rectal bleeding   . Rectal pain   . S/P TAVR (transcatheter aortic valve replacement) 11/03/2018   26 mm Edwards Sapien 3 transcatheter heart valve placed via percutaneous right transfemoral approach   . Stroke [facility])    "we were told that MRI showed previous stroke we were not aware that he had" (02/04/2018)    Past Surgical History:  Procedure Laterality Date  . ANTERIOR CERVICAL DECOMP/DISCECTOMY FUSION  2009  . BACK SURGERY  1980's  . COLONOSCOPY     x 2  . EXCISIONAL HEMORRHOIDECTOMY  1989 -2011 X 2 or 3  . LAPAROSCOPIC CHOLECYSTECTOMY  2005  . LUMBAR DISC SURGERY  1980s X 2  . MOHS SURGERY Bilateral 2006   "sides of my nose" x 2  . RIGHT/LEFT HEART CATH AND CORONARY ANGIOGRAPHY N/A 10/21/2018   Procedure: RIGHT/LEFT HEART  CATH AND CORONARY ANGIOGRAPHY;  Surgeon: Burnell Blanks, MD;  Location: [place] [facility];  Service: Cardiovascular;  Laterality: N/A;  . TEE WITHOUT CARDIOVERSION N/A 02/03/2018   Procedure: TRANSESOPHAGEAL ECHOCARDIOGRAM (TEE);  Surgeon: Lelon Perla, MD;  Location: [facility] ENDOSCOPY;  Service: Cardiovascular;  Laterality: N/A;  . TEE WITHOUT CARDIOVERSION N/A 11/03/2018   Procedure: TRANSESOPHAGEAL ECHOCARDIOGRAM (TEE);  Surgeon: Burnell Blanks, MD;  Location: [place] [facility];  Service: Open Heart Surgery;  Laterality: N/A;  . TRANSCATHETER AORTIC VALVE REPLACEMENT, TRANSFEMORAL N/A 11/03/2018   Procedure: TRANSCATHETER AORTIC VALVE REPLACEMENT, TRANSFEMORAL;  Surgeon: Burnell Blanks, MD;   Location: [place] [facility];  Service: Open Heart Surgery;  Laterality: N/A;  . WISDOM TOOTH EXTRACTION      Current Medications: Outpatient Medications Prior to Visit  Medication Sig Dispense Refill  . acetaminophen (TYLENOL) 650 MG CR tablet Take 650 mg by mouth every 8 (eight) hours as needed.    Marland Kitchen amoxicillin (AMOXIL) 500 MG tablet Take 4 pills (2,000 mg) one hour prior to all dental visits. There are enough for TWO visits in this bottle. 8 tablet 8  . aspirin EC 81 MG EC tablet Take 1 tablet (81 mg total) by mouth daily. 30 tablet 3  . carvedilol (COREG) 3.125 MG tablet TAKE 1 TABLET BY MOUTH TWICE A DAY 180 tablet 2  . dutasteride (AVODART) 0.5 MG capsule Take 0.5 mg by mouth daily.  3  . hydrochlorothiazide (HYDRODIURIL) 25 MG tablet Take 1 tablet (25 mg total) by mouth daily. 90 tablet 1  . hydrocortisone (ANUSOL-HC) 25 MG suppository Place 25 mg rectally 2 (two) times daily as needed for hemorrhoids or anal itching.    . rosuvastatin (CRESTOR) 20 MG tablet Take 1 tablet by mouth daily 30 tablet 11  . spironolactone (ALDACTONE) 25 MG tablet Take 0.5 tablets (12.5 mg total) by mouth daily. 45 tablet 3  . tamsulosin (FLOMAX) 0.4 MG CAPS capsule Take 0.4 mg by mouth daily.    Marland Kitchen tetrahydrozoline 0.05 % ophthalmic solution Place 1-2 drops into both eyes 3 (three) times daily as needed (dry/irritated eyes.).    Marland Kitchen losartan (COZAAR) 100 MG tablet Take 1 tablet (100 mg total) by mouth daily. 90 tablet 3  . meclizine (ANTIVERT) 12.5 MG tablet Take 1 tablet (12.5 mg total) by mouth 3 (three) times daily as needed for dizziness. (Patient not taking: Reported on 08/31/2019) 10 tablet 0   No facility-administered medications prior to visit.     Allergies:   Atorvastatin   Social History   Socioeconomic History  . Marital status: Married    Spouse name: Not on file  . Number of children: Not on file  . Years of education: Not on file  . Highest education level: Not on file  Occupational  History  . Occupation: retired  Tobacco Use  . Smoking status: Never Smoker  . Smokeless tobacco: Never Used  Vaping Use  . Vaping Use: Never used  Substance and Sexual Activity  . Alcohol use: Not Currently    Alcohol/week: 0.0 standard drinks  . Drug use: Not Currently  . Sexual activity: Not Currently  Other Topics Concern  . Not on file  Social History Narrative   Lives with his wife in [place], [place]   Social Determinants of Health   Financial Resource Strain:   . Difficulty of Paying Living Expenses:   Food Insecurity:   . Worried About Charity fundraiser in the Last Year:   .  Ran Out of Food in the Last Year:   Transportation Needs:   . Film/video editor (Medical):   Marland Kitchen Lack of Transportation (Non-Medical):   Physical Activity:   . Days of Exercise per Week:   . Minutes of Exercise per Session:   Stress:   . Feeling of Stress :   Social Connections:   . Frequency of Communication with Friends and Family:   . Frequency of Social Gatherings with Friends and Family:   . Attends Religious Services:   . Active Member of Clubs or Organizations:   . Attends Archivist Meetings:   Marland Kitchen Marital Status:      Family History:  The patient's family history includes Cancer in his mother; Heart disease in his father.     ROS:   Please see the history of present illness.    ROS All other systems reviewed and are negative.   PHYSICAL EXAM:   VS:  BP 128/60   Pulse 70   Ht 5\' 10"  (1.778 m)   Wt 253 lb 12.8 oz (115.1 kg)   BMI 36.42 kg/m    GEN: Well nourished, well developed, in no acute distress, obese HEENT: normal Neck: no JVD or masses Cardiac: RRR; soft flow murmur. No rubs, or gallops,no edema  Respiratory:  clear to auscultation bilaterally, normal work of breathing GI: soft, nontender, nondistended, + BS MS: no deformity or atrophy Skin: warm and dry, no rash Neuro:  Alert and Oriented x 3, Strength and sensation are intact Psych: euthymic mood,  full affect   Wt Readings from Last 3 Encounters:  11/03/19 253 lb 12.8 oz (115.1 kg)  08/17/19 252 lb 9.6 oz (114.6 kg)  07/28/19 250 lb (113.4 kg)      Studies/Labs Reviewed:   EKG:  EKG is NOT ordered today.   Recent Labs: 07/28/2019: ALT 21; Hemoglobin 16.0; Platelets 177 08/31/2019: BUN 15; Creatinine, Ser 0.97; Potassium 4.1; Sodium 140   Lipid Panel    Component Value Date/Time   CHOL 118 04/06/2018 0813   TRIG 175 (H) 04/06/2018 0813   HDL 38 (L) 04/06/2018 0813   CHOLHDL 3.1 04/06/2018 0813   CHOLHDL 6.9 01/30/2018 0347   VLDL 64 (H) 01/30/2018 0347   LDLCALC 45 04/06/2018 0813    Additional studies/ records that were reviewed today include:  TAVR OPERATIVE NOTE   Date of Procedure:11/03/2018  Preoperative Diagnosis:Severe Aortic Stenosis   Postoperative Diagnosis:Same   Procedure:   Transcatheter Aortic Valve Replacement - PercutaneousRightTransfemoral Approach Edwards Sapien 3 THV (size 65mm, model # 9600TFX, serial # [id number])  Co-Surgeons:Clarence H. Roxy Manns, MD and Lauree Chandler, MD  Anesthesiologist:Ryan Roanna Banning, MD  Echocardiographer:Mihai Croitoru, MD  Pre-operative Echo Findings: ? Severe aortic stenosis ? Normalleft ventricular systolic function  Post-operative Echo Findings: ? Traceparavalvular leak ? Normalleft ventricular systolic function   ____________________   Echo 11/04/18 IMPRESSIONS 1. The left ventricle has hyperdynamic systolic function, with an ejection fraction of >65%. The cavity size was normal. There is severely increased left ventricular wall thickness. Left ventricular diastolic Doppler parameters are consistent with  impaired relaxation. No evidence of left ventricular regional wall motion abnormalities. 2. The right ventricle has normal systolic function. The cavity was normal.  There is no increase in right ventricular wall thickness. 3. Trivial pericardial effusion is present. 4. There is mild mitral annular calcification present. No evidence of mitral valve stenosis. No significant mitral regurgitation. 5. Bioprosthetic aortic valve s/p TAVR. There is trivial peri-valvular regurgitation. The mean gradient across  the valve is moderately elevated at 28 mmHg. AVA 2.5 cm^2. ?High output/high flow as cause of elevated gradient, will need repeat echo in  future. 6. The aortic root is normal in size and structure. 7. Normal IVC size. PA systolic pressure 16 mmHg.  ____________________   Echo 11/25/18 IMPRESSIONS 1. The left ventricle has hyperdynamic systolic function, with an ejection fraction of >65%. The cavity size was normal. There is moderate concentric left ventricular hypertrophy. Left ventricular diastolic Doppler parameters are consistent with  impaired relaxation. No evidence of left ventricular regional wall motion abnormalities. 2. Left atrial size was mildly dilated. 3. Trivial pericardial effusion is present. 4. The aortic root is normal in size and structure.  Aortic Valve: The aortic valve has been repaired/replaced Aortic valve regurgitation is mild by color flow Doppler. A 26 Edwards Sapien bioprosthetic, stented aortic valve (TAVR) valve is present in the aortic position. Procedure Date: 11/03/2018 Echo  findings are consistent with mild perivalvular leak of the aortic prosthesis. Mean gradient 17 mmHg.   ______________  Echo 11/03/19 IMPRESSIONS  1. Left ventricular ejection fraction, by estimation, is 60 to 65%. The  left ventricle has normal function. The left ventricle has no regional  wall motion abnormalities. There is mild concentric left ventricular  hypertrophy. Left ventricular diastolic  parameters are consistent with Grade I diastolic dysfunction (impaired  relaxation). Elevated left atrial pressure.  2. Right  ventricular systolic function is normal. The right ventricular  size is normal.  3. Left atrial size was mildly dilated.  4. The mitral valve is normal in structure. Trivial mitral valve  regurgitation.  5. The aortic valve has been repaired/replaced. Aortic valve  regurgitation is mild to moderate. There is a 26 mm Edwards Sapien  prosthetic (TAVR) valve present in the aortic position. Procedure Date:  11/03/18. Echo findings are consistent with  perivalvular leak of the aortic prosthesis. Aortic valve mean gradient  measures 16.0 mmHg. Aortic valve Vmax measures 2.87 m/s.  6. Aortic dilatation noted. There is mild dilatation of the ascending  aorta measuring 42 mm.  Comparison(s): No significant change from prior study. Prior images  reviewed side by side. 11/25/18 EF >65%. AV 30mmHg peak PG, 38mmHg mean PG.  Mild perivalvular AI.    ASSESSMENT & PLAN:   Severe AS s/p TAVR: echo today shows EF 60%, normally functioning TAVR with a mean gradient of 16 mmHg and mild-mod PVL (previously read as trivial but Dr. Sallyanne Kuster compared studies side by side and felt there was no significant difference). He has NYHA class I symptoms. Continue on aspirin alone. He has amoxicillin for SBE prophylaxis. Continue regular follow up with Dr. Meda Coffee with yearly echos for surveillance.    Medication Adjustments/Labs and Tests Ordered: Current medicines are reviewed at length with the patient today.  Concerns regarding medicines are outlined above.  Medication changes, Labs and Tests ordered today are listed in the Patient Instructions below. Patient Instructions  Medication Instructions:  Your provider recommends that you continue on your current medications as directed. Please refer to the Current Medication list given to you today.   *If you need a refill on your cardiac medications before your next appointment, please call your pharmacy*  Follow-Up: At [facility], you and your health needs are  our priority.  As part of our continuing mission to provide you with exceptional heart care, we have created designated Provider Care Teams.  These Care Teams include your primary Cardiologist (physician) and Advanced Practice Providers (APPs -  Physician Assistants and Nurse Practitioners) who all work together to provide you with the care you need, when you need it. Your next appointment:   6 month(s) The format for your next appointment:   In Person Provider:   You may see Ena Dawley, MD or one of the following Advanced Practice Providers on your designated Care Team:    Melina Copa, PA-C  Ermalinda Barrios, PA-C      Signed, [name], PA-C  11/04/2019 10:24 AM    [address], [place],   [address] Phone: [phone]; Fax: [phone]

## 2019-11-03 ENCOUNTER — Ambulatory Visit (HOSPITAL_COMMUNITY): Payer: Medicare PPO | Attending: Cardiovascular Disease

## 2019-11-03 ENCOUNTER — Other Ambulatory Visit: Payer: Self-pay

## 2019-11-03 ENCOUNTER — Ambulatory Visit: Payer: Medicare PPO | Admitting: Physician Assistant

## 2019-11-03 VITALS — BP 128/60 | HR 70 | Ht 70.0 in | Wt 253.8 lb

## 2019-11-03 DIAGNOSIS — Z952 Presence of prosthetic heart valve: Secondary | ICD-10-CM

## 2019-11-03 MED ORDER — LOSARTAN POTASSIUM 100 MG PO TABS: 100.0000 mg | ORAL_TABLET | Freq: Every day | ORAL | 3 refills | Status: DC

## 2019-11-03 NOTE — Patient Instructions (Signed)
Medication Instructions:  Your provider recommends that you continue on your current medications as directed. Please refer to the Current Medication list given to you today.   *If you need a refill on your cardiac medications before your next appointment, please call your pharmacy*  Follow-Up: At [facility], you and your health needs are our priority.  As part of our continuing mission to provide you with exceptional heart care, we have created designated Provider Care Teams.  These Care Teams include your primary Cardiologist (physician) and Advanced Practice Providers (APPs -  Physician Assistants and Nurse Practitioners) who all work together to provide you with the care you need, when you need it. Your next appointment:   6 month(s) The format for your next appointment:   In Person Provider:   You may see Ena Dawley, MD or one of the following Advanced Practice Providers on your designated Care Team:    Melina Copa, PA-C  Ermalinda Barrios, PA-C

## 2019-11-16 DIAGNOSIS — I10 Essential (primary) hypertension: Secondary | ICD-10-CM | POA: Diagnosis not present

## 2019-11-16 DIAGNOSIS — Z809 Family history of malignant neoplasm, unspecified: Secondary | ICD-10-CM | POA: Diagnosis not present

## 2019-11-16 DIAGNOSIS — K5900 Constipation, unspecified: Secondary | ICD-10-CM | POA: Diagnosis not present

## 2019-11-16 DIAGNOSIS — E785 Hyperlipidemia, unspecified: Secondary | ICD-10-CM | POA: Diagnosis not present

## 2019-11-16 DIAGNOSIS — Z8249 Family history of ischemic heart disease and other diseases of the circulatory system: Secondary | ICD-10-CM | POA: Diagnosis not present

## 2019-11-16 DIAGNOSIS — G8929 Other chronic pain: Secondary | ICD-10-CM | POA: Diagnosis not present

## 2019-11-16 DIAGNOSIS — G4733 Obstructive sleep apnea (adult) (pediatric): Secondary | ICD-10-CM | POA: Diagnosis not present

## 2019-11-16 DIAGNOSIS — N400 Enlarged prostate without lower urinary tract symptoms: Secondary | ICD-10-CM | POA: Diagnosis not present

## 2019-11-22 DIAGNOSIS — G4733 Obstructive sleep apnea (adult) (pediatric): Secondary | ICD-10-CM | POA: Diagnosis not present

## 2019-11-30 DIAGNOSIS — C44619 Basal cell carcinoma of skin of left upper limb, including shoulder: Secondary | ICD-10-CM | POA: Diagnosis not present

## 2019-11-30 DIAGNOSIS — Z85828 Personal history of other malignant neoplasm of skin: Secondary | ICD-10-CM | POA: Diagnosis not present

## 2019-11-30 DIAGNOSIS — L918 Other hypertrophic disorders of the skin: Secondary | ICD-10-CM | POA: Diagnosis not present

## 2019-11-30 DIAGNOSIS — L858 Other specified epidermal thickening: Secondary | ICD-10-CM | POA: Diagnosis not present

## 2019-11-30 DIAGNOSIS — L821 Other seborrheic keratosis: Secondary | ICD-10-CM | POA: Diagnosis not present

## 2019-11-30 DIAGNOSIS — L820 Inflamed seborrheic keratosis: Secondary | ICD-10-CM | POA: Diagnosis not present

## 2019-11-30 DIAGNOSIS — D485 Neoplasm of uncertain behavior of skin: Secondary | ICD-10-CM | POA: Diagnosis not present

## 2019-11-30 DIAGNOSIS — L723 Sebaceous cyst: Secondary | ICD-10-CM | POA: Diagnosis not present

## 2019-12-15 DIAGNOSIS — K649 Unspecified hemorrhoids: Secondary | ICD-10-CM | POA: Diagnosis not present

## 2019-12-15 DIAGNOSIS — K602 Anal fissure, unspecified: Secondary | ICD-10-CM | POA: Diagnosis not present

## 2019-12-20 ENCOUNTER — Other Ambulatory Visit: Payer: Self-pay | Admitting: Physician Assistant

## 2019-12-20 NOTE — Telephone Encounter (Signed)
Pt's pharmacy is requesting a refill on amoxicillin. Would Dr. Meda Coffee like to refill this medication? Please address

## 2020-01-06 DIAGNOSIS — N400 Enlarged prostate without lower urinary tract symptoms: Secondary | ICD-10-CM | POA: Diagnosis not present

## 2020-01-06 DIAGNOSIS — M545 Low back pain: Secondary | ICD-10-CM | POA: Diagnosis not present

## 2020-01-06 DIAGNOSIS — Z6837 Body mass index (BMI) 37.0-37.9, adult: Secondary | ICD-10-CM | POA: Diagnosis not present

## 2020-01-06 DIAGNOSIS — I69320 Aphasia following cerebral infarction: Secondary | ICD-10-CM | POA: Diagnosis not present

## 2020-01-06 DIAGNOSIS — E119 Type 2 diabetes mellitus without complications: Secondary | ICD-10-CM | POA: Diagnosis not present

## 2020-01-06 DIAGNOSIS — Z23 Encounter for immunization: Secondary | ICD-10-CM | POA: Diagnosis not present

## 2020-01-06 DIAGNOSIS — G4733 Obstructive sleep apnea (adult) (pediatric): Secondary | ICD-10-CM | POA: Diagnosis not present

## 2020-01-06 DIAGNOSIS — E781 Pure hyperglyceridemia: Secondary | ICD-10-CM | POA: Diagnosis not present

## 2020-01-06 DIAGNOSIS — I10 Essential (primary) hypertension: Secondary | ICD-10-CM | POA: Diagnosis not present

## 2020-01-06 DIAGNOSIS — Z952 Presence of prosthetic heart valve: Secondary | ICD-10-CM | POA: Diagnosis not present

## 2020-01-16 ENCOUNTER — Other Ambulatory Visit: Payer: Self-pay | Admitting: Physician Assistant

## 2020-02-10 DIAGNOSIS — N451 Epididymitis: Secondary | ICD-10-CM | POA: Diagnosis not present

## 2020-02-10 DIAGNOSIS — N400 Enlarged prostate without lower urinary tract symptoms: Secondary | ICD-10-CM | POA: Diagnosis not present

## 2020-02-10 DIAGNOSIS — Z6837 Body mass index (BMI) 37.0-37.9, adult: Secondary | ICD-10-CM | POA: Diagnosis not present

## 2020-02-10 DIAGNOSIS — N419 Inflammatory disease of prostate, unspecified: Secondary | ICD-10-CM | POA: Diagnosis not present

## 2020-02-21 DIAGNOSIS — H40053 Ocular hypertension, bilateral: Secondary | ICD-10-CM | POA: Diagnosis not present

## 2020-03-22 DIAGNOSIS — K602 Anal fissure, unspecified: Secondary | ICD-10-CM | POA: Diagnosis not present

## 2020-03-22 DIAGNOSIS — K649 Unspecified hemorrhoids: Secondary | ICD-10-CM | POA: Diagnosis not present

## 2020-03-23 DIAGNOSIS — R351 Nocturia: Secondary | ICD-10-CM | POA: Diagnosis not present

## 2020-03-23 DIAGNOSIS — R350 Frequency of micturition: Secondary | ICD-10-CM | POA: Diagnosis not present

## 2020-03-23 DIAGNOSIS — R3912 Poor urinary stream: Secondary | ICD-10-CM | POA: Diagnosis not present

## 2020-03-23 DIAGNOSIS — N401 Enlarged prostate with lower urinary tract symptoms: Secondary | ICD-10-CM | POA: Diagnosis not present

## 2020-03-23 DIAGNOSIS — R3915 Urgency of urination: Secondary | ICD-10-CM | POA: Diagnosis not present

## 2020-03-23 DIAGNOSIS — R3914 Feeling of incomplete bladder emptying: Secondary | ICD-10-CM | POA: Diagnosis not present

## 2020-03-25 ENCOUNTER — Other Ambulatory Visit: Payer: Self-pay | Admitting: Cardiology

## 2020-04-05 ENCOUNTER — Encounter: Payer: Self-pay | Admitting: Cardiology

## 2020-04-07 DIAGNOSIS — I69320 Aphasia following cerebral infarction: Secondary | ICD-10-CM | POA: Diagnosis not present

## 2020-04-07 DIAGNOSIS — Z952 Presence of prosthetic heart valve: Secondary | ICD-10-CM | POA: Diagnosis not present

## 2020-04-07 DIAGNOSIS — G4733 Obstructive sleep apnea (adult) (pediatric): Secondary | ICD-10-CM | POA: Diagnosis not present

## 2020-04-07 DIAGNOSIS — E781 Pure hyperglyceridemia: Secondary | ICD-10-CM | POA: Diagnosis not present

## 2020-04-07 DIAGNOSIS — E119 Type 2 diabetes mellitus without complications: Secondary | ICD-10-CM | POA: Diagnosis not present

## 2020-04-07 DIAGNOSIS — I10 Essential (primary) hypertension: Secondary | ICD-10-CM | POA: Diagnosis not present

## 2020-04-07 DIAGNOSIS — Z79899 Other long term (current) drug therapy: Secondary | ICD-10-CM | POA: Diagnosis not present

## 2020-04-07 DIAGNOSIS — N400 Enlarged prostate without lower urinary tract symptoms: Secondary | ICD-10-CM | POA: Diagnosis not present

## 2020-04-07 DIAGNOSIS — M545 Low back pain, unspecified: Secondary | ICD-10-CM | POA: Diagnosis not present

## 2020-04-17 NOTE — Progress Notes (Signed)
Cardiology Office Note:    Date:  04/18/2020   ID:  [name], DOB 06-06-1945, MRN [id number]  PCP:  Cyndi Bender, PA-C  [facility] Cardiologist:  Ena Dawley, MD  [facility] Electrophysiologist:  None  Structural [facility]:  Lauree Chandler, MD    Referring MD: Cyndi Bender, PA-C   Chief Complaint:  Follow-up (Hx of TAVR, CAD)    Patient Profile:    [name] is a 74 y.o. male with:   Aortic stenosis  S/p TAVR 10/2018  Coronary artery disease   Mild to mod non-obstructive dz by cath in 10/2018  S/p CVA  Hypertension   Hyperlipidemia   OSA on CPAP   Hx of seizures   Lung nodule   Prior CV studies: Echocardiogram 11/03/19 EF 60-65, no RWMA, mild LVH, Gr 1 DD, normal RVSF, mild LAE, trivial MR, TAVR mean 16 mmHg, mild to mod PVL, ascending aorta 42 mm   Cardiac catheterization 10/21/2018 LAD dist 20 LCx ost 20, mid 20 RCA mid 40  History of Present Illness:    [name] was last seen by Dr. Meda Coffee in 4/21.  He was last seen in the structural heart clinic by [name], PA-C in 10/2019.  His last echocardiogram showed normal EF and mild to mod TAVR paravalvular leak.  This was felt to be stable compared with the last echocardiogram in 2020.  He returns for f/u.  He is here alone.  Since last seen, he has done well.  He has not had chest discomfort, significant shortness of breath, syncope, orthopnea, leg swelling.  He does have issues with balance when walking but denies lightheadedness or near syncope.  He has had some issues with his bladder recently and has a cystoscopy planned next month with his urologist.      Past Medical History:  Diagnosis Date  . Aortic stenosis, severe   . Arthritis    "hands" (02/04/2018)  . Basal cell carcinoma    "MOHS surgery on sides of my nose" (02/04/2018)  . Chronic lower back pain   . CVA (cerebral vascular accident) ([place]) 01/29/2018  . CVA (cerebral vascular accident) ([place]) 02/01/2018  .  Diarrhea   . Difficulty urinating   . Heart murmur    no problems  . High cholesterol   . Hypertension   . OSA on CPAP    "just got CPAP today" (02/04/2018)  . Rectal bleeding   . Rectal pain   . S/P TAVR (transcatheter aortic valve replacement) 11/03/2018   26 mm Edwards Sapien 3 transcatheter heart valve placed via percutaneous right transfemoral approach   . Stroke [facility])    "we were told that MRI showed previous stroke we were not aware that he had" (02/04/2018)    Current Medications: Current Meds  Medication Sig  . acetaminophen (TYLENOL) 650 MG CR tablet Take 650 mg by mouth every 8 (eight) hours as needed.  Marland Kitchen amoxicillin (AMOXIL) 500 MG tablet TAKE 4 PILLS (2,000 MG) ONE HOUR PRIOR TO ALL DENTAL VISITS. THERE ARE ENOUGH FOR TWO VISITS IN THIS BOTTLE.  Marland Kitchen aspirin EC 81 MG EC tablet Take 1 tablet (81 mg total) by mouth daily.  . carvedilol (COREG) 3.125 MG tablet TAKE 1 TABLET BY MOUTH TWICE A DAY  . dutasteride (AVODART) 0.5 MG capsule Take 0.5 mg by mouth daily.  . hydrochlorothiazide (HYDRODIURIL) 25 MG tablet TAKE 1 TABLET BY MOUTH EVERY DAY  . hydrocortisone (ANUSOL-HC) 25 MG suppository Place 25 mg rectally 2 (  two) times daily as needed for hemorrhoids or anal itching.  . losartan (COZAAR) 100 MG tablet Take 1 tablet (100 mg total) by mouth daily.  . rosuvastatin (CRESTOR) 20 MG tablet Take 1 tablet by mouth daily  . spironolactone (ALDACTONE) 25 MG tablet Take 0.5 tablets (12.5 mg total) by mouth daily.  . tamsulosin (FLOMAX) 0.4 MG CAPS capsule Take 0.4 mg by mouth daily.  Marland Kitchen tetrahydrozoline 0.05 % ophthalmic solution Place 1-2 drops into both eyes 3 (three) times daily as needed (dry/irritated eyes.).     Allergies:   Atorvastatin   Social History   Tobacco Use  . Smoking status: Never Smoker  . Smokeless tobacco: Never Used  Vaping Use  . Vaping Use: Never used  Substance Use Topics  . Alcohol use: Not Currently    Alcohol/week: 0.0 standard drinks  . Drug use:  Not Currently     Family Hx: The patient's family history includes Cancer in his mother; Heart disease in his father. There is no history of Stroke.  Review of Systems  Gastrointestinal: Positive for hematochezia (due to anal fissure).     EKGs/Labs/Other Test Reviewed:    EKG:  EKG is  ordered today.  The ekg ordered today demonstrates normal sinus rhythm, heart rate 62, left axis deviation, nonspecific ST-T wave changes, QTC 434, no change from prior tracing  Recent Labs: 07/28/2019: ALT 21; Hemoglobin 16.0; Platelets 177 08/31/2019: BUN 15; Creatinine, Ser 0.97; Potassium 4.1; Sodium 140   Recent Lipid Panel Lab Results  Component Value Date/Time   CHOL 118 04/06/2018 08:13 AM   TRIG 175 (H) 04/06/2018 08:13 AM   HDL 38 (L) 04/06/2018 08:13 AM   CHOLHDL 3.1 04/06/2018 08:13 AM   CHOLHDL 6.9 01/30/2018 03:47 AM   LDLCALC 45 04/06/2018 08:13 AM    Labs obtained through [place] - personally reviewed and interpreted: 01/06/2020: Total cholesterol 138, HDL 39, LDL 57, triglycerides 268, hemoglobin A1c 6.5, hemoglobin 14.4, creatinine 1.01, K+ 4.2, ALT 19  Risk Assessment/Calculations:      Physical Exam:    VS:  BP 126/66   Pulse 62   Ht 5\' 10"  (1.778 m)   Wt 256 lb (116.1 kg)   SpO2 95%   BMI 36.73 kg/m     Wt Readings from Last 3 Encounters:  04/18/20 256 lb (116.1 kg)  11/03/19 253 lb 12.8 oz (115.1 kg)  08/17/19 252 lb 9.6 oz (114.6 kg)     Constitutional:      Appearance: Healthy appearance. Not in distress.  Neck:     Vascular: JVD normal.  Pulmonary:     Effort: Pulmonary effort is normal.     Breath sounds: No wheezing. No rales.  Cardiovascular:     Normal rate. Regular rhythm. Normal S1. Normal S2.     Murmurs: There is a grade 2/6 systolic murmur at the LLSB.  Edema:    Peripheral edema absent.  Abdominal:     Palpations: Abdomen is soft.  Skin:    General: Skin is warm and dry.  Neurological:     General: No focal deficit present.     Mental  Status: Alert and oriented to person, place and time.     Cranial Nerves: Cranial nerves are intact.       ASSESSMENT & PLAN:    1. S/P TAVR (transcatheter aortic valve replacement) Normally functioning TAVR by echocardiogram June 2021.  He has mild to moderate paravalvular leak.  Continue SBE prophylaxis.  He questioned if  he would need antibiotics prior to his upcoming cystoscopy.  I explained that ordinarily he would not need antibiotics prior to GU procedures.  I did ask him to follow-up with his urologist to make sure his procedure is considered low risk for infection.  2. Essential hypertension The patient's blood pressure is controlled on his current regimen.  Continue current therapy.   3. Coronary artery disease involving native coronary artery of native heart without angina pectoris Mild to moderate nonobstructive disease by cardiac catheterization June 2020.  He denies anginal symptoms.  Continue aspirin, rosuvastatin.  4. Mixed hyperlipidemia LDL optimal on most recent lab work.  Continue current Rx.      Dispo:  Return in about 6 months (around 10/17/2020) for Routine Follow Up w/ Dr. Meda Coffee, in person.   Medication Adjustments/Labs and Tests Ordered: Current medicines are reviewed at length with the patient today.  Concerns regarding medicines are outlined above.  Tests Ordered: Orders Placed This Encounter  Procedures  . EKG 12-Lead   Medication Changes: No orders of the defined types were placed in this encounter.   Signed, Richardson Dopp, PA-C  04/18/2020 2:10 PM    [facility] [place], [place], [place]  [address] Phone: [phone]; Fax: [phone]

## 2020-04-18 ENCOUNTER — Encounter: Payer: Self-pay | Admitting: Physician Assistant

## 2020-04-18 ENCOUNTER — Ambulatory Visit: Payer: Medicare PPO | Admitting: Physician Assistant

## 2020-04-18 ENCOUNTER — Other Ambulatory Visit: Payer: Self-pay

## 2020-04-18 VITALS — BP 126/66 | HR 62 | Ht 70.0 in | Wt 256.0 lb

## 2020-04-18 DIAGNOSIS — E782 Mixed hyperlipidemia: Secondary | ICD-10-CM | POA: Diagnosis not present

## 2020-04-18 DIAGNOSIS — I2510 Atherosclerotic heart disease of native coronary artery without angina pectoris: Secondary | ICD-10-CM | POA: Diagnosis not present

## 2020-04-18 DIAGNOSIS — I10 Essential (primary) hypertension: Secondary | ICD-10-CM | POA: Diagnosis not present

## 2020-04-18 DIAGNOSIS — Z952 Presence of prosthetic heart valve: Secondary | ICD-10-CM | POA: Diagnosis not present

## 2020-04-18 NOTE — Patient Instructions (Signed)
Medication Instructions:  Your physician recommends that you continue on your current medications as directed. Please refer to the Current Medication list given to you today.  *If you need a refill on your cardiac medications before your next appointment, please call your pharmacy*  Lab Work: None ordered today  Testing/Procedures: None ordered today  Follow-Up: At [facility], you and your health needs are our priority.  As part of our continuing mission to provide you with exceptional heart care, we have created designated Provider Care Teams.  These Care Teams include your primary Cardiologist (physician) and Advanced Practice Providers (APPs -  Physician Assistants and Nurse Practitioners) who all work together to provide you with the care you need, when you need it.  Your next appointment:   6 month(s)  The format for your next appointment:   In Person  Provider:   Ena Dawley, MD

## 2020-04-20 ENCOUNTER — Ambulatory Visit: Payer: Medicare PPO | Admitting: Cardiology

## 2020-05-13 ENCOUNTER — Other Ambulatory Visit: Payer: Self-pay | Admitting: Cardiology

## 2020-05-13 DIAGNOSIS — I10 Essential (primary) hypertension: Secondary | ICD-10-CM

## 2020-05-13 DIAGNOSIS — Z952 Presence of prosthetic heart valve: Secondary | ICD-10-CM

## 2020-05-13 DIAGNOSIS — E785 Hyperlipidemia, unspecified: Secondary | ICD-10-CM

## 2020-05-22 ENCOUNTER — Encounter (INDEPENDENT_AMBULATORY_CARE_PROVIDER_SITE_OTHER): Payer: Self-pay

## 2020-05-24 DIAGNOSIS — G4733 Obstructive sleep apnea (adult) (pediatric): Secondary | ICD-10-CM | POA: Diagnosis not present

## 2020-05-24 DIAGNOSIS — R350 Frequency of micturition: Secondary | ICD-10-CM | POA: Diagnosis not present

## 2020-05-24 DIAGNOSIS — R3915 Urgency of urination: Secondary | ICD-10-CM | POA: Diagnosis not present

## 2020-06-01 DIAGNOSIS — L570 Actinic keratosis: Secondary | ICD-10-CM | POA: Diagnosis not present

## 2020-06-01 DIAGNOSIS — Z85828 Personal history of other malignant neoplasm of skin: Secondary | ICD-10-CM | POA: Diagnosis not present

## 2020-06-01 DIAGNOSIS — L821 Other seborrheic keratosis: Secondary | ICD-10-CM | POA: Diagnosis not present

## 2020-06-12 DIAGNOSIS — R351 Nocturia: Secondary | ICD-10-CM | POA: Diagnosis not present

## 2020-06-12 DIAGNOSIS — N401 Enlarged prostate with lower urinary tract symptoms: Secondary | ICD-10-CM | POA: Diagnosis not present

## 2020-06-12 DIAGNOSIS — R3914 Feeling of incomplete bladder emptying: Secondary | ICD-10-CM | POA: Diagnosis not present

## 2020-06-13 ENCOUNTER — Other Ambulatory Visit: Payer: Self-pay

## 2020-06-13 ENCOUNTER — Emergency Department (HOSPITAL_COMMUNITY)
Admission: EM | Admit: 2020-06-13 | Discharge: 2020-06-13 | Disposition: A | Discharge status: Home / Self Care | Payer: Medicare PPO | Attending: Emergency Medicine | Admitting: Emergency Medicine

## 2020-06-13 DIAGNOSIS — R319 Hematuria, unspecified: Secondary | ICD-10-CM | POA: Diagnosis present

## 2020-06-13 DIAGNOSIS — I10 Essential (primary) hypertension: Secondary | ICD-10-CM | POA: Diagnosis not present

## 2020-06-13 DIAGNOSIS — Z79899 Other long term (current) drug therapy: Secondary | ICD-10-CM | POA: Insufficient documentation

## 2020-06-13 DIAGNOSIS — R310 Gross hematuria: Secondary | ICD-10-CM | POA: Insufficient documentation

## 2020-06-13 DIAGNOSIS — Z7982 Long term (current) use of aspirin: Secondary | ICD-10-CM | POA: Insufficient documentation

## 2020-06-13 LAB — URINALYSIS, ROUTINE W REFLEX MICROSCOPIC
RBC / HPF: >50 RBC/hpf — ABNORMAL HIGH (ref 0–5)
Specific Gravity, Urine: 1.01 (ref 1.005–1.030)
pH: 7 (ref 5.0–8.0)

## 2020-06-13 LAB — CBC
HCT: 42.5 % (ref 39.0–52.0)
Hemoglobin: 14.3 g/dL (ref 13.0–17.0)
MCH: 31.1 pg (ref 26.0–34.0)
MCHC: 33.6 g/dL (ref 30.0–36.0)
MCV: 92.4 fL (ref 80.0–100.0)
Platelets: 190 10*3/uL (ref 150–400)
RBC: 4.6 MIL/uL (ref 4.22–5.81)
RDW: 12.1 % (ref 11.5–15.5)
WBC: 10.9 10*3/uL — ABNORMAL HIGH (ref 4.0–10.5)
nRBC: 0 % (ref 0.0–0.2)

## 2020-06-13 LAB — BASIC METABOLIC PANEL
Anion gap: 12 (ref 5–15)
BUN: 12 mg/dL (ref 8–23)
CO2: 25 mmol/L (ref 22–32)
Calcium: 9 mg/dL (ref 8.9–10.3)
Chloride: 97 mmol/L — ABNORMAL LOW (ref 98–111)
Creatinine, Ser: 1.18 mg/dL (ref 0.61–1.24)
GFR, Estimated: >60 mL/min (ref >60)
Glucose, Bld: 163 mg/dL — ABNORMAL HIGH (ref 70–99)
Potassium: 3.7 mmol/L (ref 3.5–5.1)
Sodium: 134 mmol/L — ABNORMAL LOW (ref 135–145)

## 2020-06-13 MED ORDER — CEFTRIAXONE SODIUM 1 G IJ SOLR
1.0000 g | Freq: Once | INTRAMUSCULAR | Status: AC
  Administered 2020-06-13: 1 g via INTRAMUSCULAR
  Filled 2020-06-13: qty 10

## 2020-06-13 MED ORDER — STERILE WATER FOR INJECTION IJ SOLN
INTRAMUSCULAR | Status: AC
  Filled 2020-06-13: qty 10

## 2020-06-13 MED ORDER — CEFDINIR 300 MG PO CAPS: 300.0000 mg | ORAL_CAPSULE | Freq: Two times a day (BID) | ORAL | 0 refills | Status: DC

## 2020-06-13 NOTE — ED Notes (Signed)
Bladder scan showed 130-150 ml. EDP informed.

## 2020-06-13 NOTE — ED Provider Notes (Signed)
[place] DEPT Provider Note   CSN: [id number] Arrival date & time: 06/13/20  0229     History Chief Complaint  Patient presents with  . Hematuria    [name] is a 75 y.o. male.  Patient presents to the emergency department for evaluation of hematuria.  Patient had urodynamic testing in the office at [facility] urologist yesterday.  After he went home he started to notice passage of clots and blood in his urine.  He reports that he called Dr. Jeffie Pollock and was told to stop his aspirin, was not given any further instructions.  Tonight he has noticed difficulty passing his urine is well as severe urgency causing incontinence.        Past Medical History:  Diagnosis Date  . Aortic stenosis, severe   . Arthritis    "hands" (02/04/2018)  . Basal cell carcinoma    "MOHS surgery on sides of my nose" (02/04/2018)  . Chronic lower back pain   . CVA (cerebral vascular accident) ([place]) 01/29/2018  . CVA (cerebral vascular accident) ([place]) 02/01/2018  . Diarrhea   . Difficulty urinating   . Heart murmur    no problems  . High cholesterol   . Hypertension   . OSA on CPAP    "just got CPAP today" (02/04/2018)  . Rectal bleeding   . Rectal pain   . S/P TAVR (transcatheter aortic valve replacement) 11/03/2018   26 mm Edwards Sapien 3 transcatheter heart valve placed via percutaneous right transfemoral approach   . Stroke [facility])    "we were told that MRI showed previous stroke we were not aware that he had" (02/04/2018)    Patient Active Problem List   Diagnosis Date Noted  . Hyperglycemia 01/08/2019  . S/P TAVR (transcatheter aortic valve replacement) 11/03/2018  . Aortic stenosis, severe   . Chest pain 02/24/2018  . Aortic stenosis 02/24/2018  . Palpitations 02/24/2018  . Hyperlipemia 02/24/2018  . Aphasia   . Observed seizure-like activity ([place]) 02/01/2018  . CVA (cerebral vascular accident) ([place]) 01/29/2018  . Brain tumor ([place]) 01/29/2018  .  Essential hypertension 01/29/2018  . BPH (benign prostatic hyperplasia) 01/29/2018  . Anal or rectal pain 05/27/2011    Past Surgical History:  Procedure Laterality Date  . ANTERIOR CERVICAL DECOMP/DISCECTOMY FUSION  2009  . BACK SURGERY  1980's  . COLONOSCOPY     x 2  . EXCISIONAL HEMORRHOIDECTOMY  1989 -2011 X 2 or 3  . LAPAROSCOPIC CHOLECYSTECTOMY  2005  . LUMBAR DISC SURGERY  1980s X 2  . MOHS SURGERY Bilateral 2006   "sides of my nose" x 2  . RIGHT/LEFT HEART CATH AND CORONARY ANGIOGRAPHY N/A 10/21/2018   Procedure: RIGHT/LEFT HEART CATH AND CORONARY ANGIOGRAPHY;  Surgeon: Burnell Blanks, MD;  Location: [place] CV LAB;  Service: Cardiovascular;  Laterality: N/A;  . TEE WITHOUT CARDIOVERSION N/A 02/03/2018   Procedure: TRANSESOPHAGEAL ECHOCARDIOGRAM (TEE);  Surgeon: Lelon Perla, MD;  Location: [facility] ENDOSCOPY;  Service: Cardiovascular;  Laterality: N/A;  . TEE WITHOUT CARDIOVERSION N/A 11/03/2018   Procedure: TRANSESOPHAGEAL ECHOCARDIOGRAM (TEE);  Surgeon: Burnell Blanks, MD;  Location: [place] CV LAB;  Service: Open Heart Surgery;  Laterality: N/A;  . TRANSCATHETER AORTIC VALVE REPLACEMENT, TRANSFEMORAL N/A 11/03/2018   Procedure: TRANSCATHETER AORTIC VALVE REPLACEMENT, TRANSFEMORAL;  Surgeon: Burnell Blanks, MD;  Location: [place] CV LAB;  Service: Open Heart Surgery;  Laterality: N/A;  . WISDOM TOOTH EXTRACTION  Family History  Problem Relation Age of Onset  . Cancer Mother        breast/colon  . Heart disease Father   . Stroke Neg Hx     Social History   Tobacco Use  . Smoking status: Never Smoker  . Smokeless tobacco: Never Used  Vaping Use  . Vaping Use: Never used  Substance Use Topics  . Alcohol use: Not Currently    Alcohol/week: 0.0 standard drinks  . Drug use: Not Currently    Home Medications Prior to Admission medications   Medication Sig Start Date End Date Taking? Authorizing Provider  acetaminophen  (TYLENOL) 650 MG CR tablet Take 650 mg by mouth every 8 (eight) hours as needed.    [provider]  amoxicillin (AMOXIL) 500 MG tablet TAKE 4 PILLS (2,000 MG) ONE HOUR PRIOR TO ALL DENTAL VISITS. THERE ARE ENOUGH FOR TWO VISITS IN THIS BOTTLE. 12/20/19   Dorothy Spark, MD  aspirin EC 81 MG EC tablet Take 1 tablet (81 mg total) by mouth daily. 02/05/18   Dana Allan I, MD  carvedilol (COREG) 3.125 MG tablet TAKE 1 TABLET BY MOUTH TWICE A DAY 01/17/20   Eileen Stanford, PA-C  dutasteride (AVODART) 0.5 MG capsule Take 0.5 mg by mouth daily. 12/29/17   [provider]  hydrochlorothiazide (HYDRODIURIL) 25 MG tablet TAKE 1 TABLET BY MOUTH EVERY DAY 03/27/20   Dorothy Spark, MD  hydrocortisone (ANUSOL-HC) 25 MG suppository Place 25 mg rectally 2 (two) times daily as needed for hemorrhoids or anal itching.    [provider]  losartan (COZAAR) 100 MG tablet Take 1 tablet (100 mg total) by mouth daily. 11/03/19 10/28/20  Eileen Stanford, PA-C  rosuvastatin (CRESTOR) 20 MG tablet Take 1 tablet (20 mg total) by mouth daily. 05/16/20   Dorothy Spark, MD  spironolactone (ALDACTONE) 25 MG tablet TAKE 1/2 TABLET BY MOUTH EVERY DAY 05/16/20   Dorothy Spark, MD  tamsulosin (FLOMAX) 0.4 MG CAPS capsule Take 0.4 mg by mouth daily.    [provider]  tetrahydrozoline 0.05 % ophthalmic solution Place 1-2 drops into both eyes 3 (three) times daily as needed (dry/irritated eyes.).    [provider]    Allergies    Atorvastatin  Review of Systems   Review of Systems  Genitourinary: Positive for frequency, hematuria and urgency.  All other systems reviewed and are negative.   Physical Exam Updated Vital Signs BP (!) 151/59 (BP Location: Right Arm)   Pulse 63   Temp 98.3 F (36.8 C) (Oral)   Resp 16   Ht 5\' 10"  (1.778 m)   Wt 113.4 kg   SpO2 98%   BMI 35.87 kg/m   Physical Exam Vitals and nursing note reviewed.  Constitutional:       General: He is not in acute distress.    Appearance: Normal appearance. He is well-developed and well-nourished.  HENT:     Head: Normocephalic and atraumatic.     Right Ear: Hearing normal.     Left Ear: Hearing normal.     Nose: Nose normal.     Mouth/Throat:     Mouth: Oropharynx is clear and moist and mucous membranes are normal.  Eyes:     Extraocular Movements: EOM normal.     Conjunctiva/sclera: Conjunctivae normal.     Pupils: Pupils are equal, round, and reactive to light.  Cardiovascular:     Rate and Rhythm: Regular rhythm.     Heart  sounds: S1 normal and S2 normal. No murmur heard. No friction rub. No gallop.   Pulmonary:     Effort: Pulmonary effort is normal. No respiratory distress.     Breath sounds: Normal breath sounds.  Chest:     Chest wall: No tenderness.  Abdominal:     General: Bowel sounds are normal.     Palpations: Abdomen is soft. There is no hepatosplenomegaly.     Tenderness: There is no abdominal tenderness. There is no guarding or rebound. Negative signs include Murphy's sign and McBurney's sign.     Hernia: No hernia is present.  Musculoskeletal:        General: Normal range of motion.     Cervical back: Normal range of motion and neck supple.  Skin:    General: Skin is warm, dry and intact.     Findings: No rash.     Nails: There is no cyanosis.  Neurological:     Mental Status: He is alert and oriented to person, place, and time.     GCS: GCS eye subscore is 4. GCS verbal subscore is 5. GCS motor subscore is 6.     Cranial Nerves: No cranial nerve deficit.     Sensory: No sensory deficit.     Coordination: Coordination normal.     Deep Tendon Reflexes: Strength normal.  Psychiatric:        Mood and Affect: Mood and affect normal.        Speech: Speech normal.        Behavior: Behavior normal.        Thought Content: Thought content normal.     ED Results / Procedures / Treatments   Labs (all labs ordered are listed, but only  abnormal results are displayed) Labs Reviewed  URINALYSIS, ROUTINE W REFLEX MICROSCOPIC - Abnormal; Notable for the following components:      Result Value   Color, Urine RED (*)    APPearance TURBID (*)    Glucose, UA   (*)    Value: TEST NOT REPORTED DUE TO COLOR INTERFERENCE OF URINE PIGMENT   Hgb urine dipstick   (*)    Value: TEST NOT REPORTED DUE TO COLOR INTERFERENCE OF URINE PIGMENT   Bilirubin Urine   (*)    Value: TEST NOT REPORTED DUE TO COLOR INTERFERENCE OF URINE PIGMENT   Ketones, ur   (*)    Value: TEST NOT REPORTED DUE TO COLOR INTERFERENCE OF URINE PIGMENT   Protein, ur   (*)    Value: TEST NOT REPORTED DUE TO COLOR INTERFERENCE OF URINE PIGMENT   Nitrite   (*)    Value: TEST NOT REPORTED DUE TO COLOR INTERFERENCE OF URINE PIGMENT   Leukocytes,Ua   (*)    Value: TEST NOT REPORTED DUE TO COLOR INTERFERENCE OF URINE PIGMENT   RBC / HPF >50 (*)    Bacteria, UA MANY (*)    All other components within normal limits  BASIC METABOLIC PANEL - Abnormal; Notable for the following components:   Sodium 134 (*)    Chloride 97 (*)    Glucose, Bld 163 (*)    All other components within normal limits  CBC - Abnormal; Notable for the following components:   WBC 10.9 (*)    All other components within normal limits  URINE CULTURE    EKG None  Radiology No results found.  Procedures Procedures   Medications Ordered in ED Medications - No data to display  ED Course  I have reviewed the triage vital signs and the nursing notes.  Pertinent labs & imaging results that were available during my care of the patient were reviewed by me and considered in my medical decision making (see chart for details).    MDM Rules/Calculators/A&P                          Patient appears well.  Vital signs are unremarkable other than mild hypertension.  Lab work is reassuring.  Urinalysis shows hematuria but no clear signs of infection.  Will culture.  Patient does have a repaired  aortic valve.  We'll therefore empirically cover for possible infection while awaiting culture.  As he appears well, is afebrile, vital signs are unremarkable, does not require hospitalization or further work-up at this time.  Given return precautions.  He has been able to pass urine without difficulty here in the department.  His last sample that he provided was light pink without clots.  No evidence of obstruction.  Final Clinical Impression(s) / ED Diagnoses Final diagnoses:  Gross hematuria    Rx / DC Orders ED Discharge Orders    None       Murlean Seelye, Gwenyth Allegra, MD 06/13/20 [phone]

## 2020-06-13 NOTE — ED Triage Notes (Signed)
Patient states he had a procedure at urologist yesterday. Patient states since then he is has been passing clots. Patient also states it feels like he is having trouble urinating. Patient states he urinated on himself in the lobby.

## 2020-06-14 LAB — URINE CULTURE: Culture: NO GROWTH

## 2020-06-22 DIAGNOSIS — Z9181 History of falling: Secondary | ICD-10-CM | POA: Diagnosis not present

## 2020-06-22 DIAGNOSIS — Z1331 Encounter for screening for depression: Secondary | ICD-10-CM | POA: Diagnosis not present

## 2020-06-22 DIAGNOSIS — E669 Obesity, unspecified: Secondary | ICD-10-CM | POA: Diagnosis not present

## 2020-06-22 DIAGNOSIS — E785 Hyperlipidemia, unspecified: Secondary | ICD-10-CM | POA: Diagnosis not present

## 2020-06-22 DIAGNOSIS — Z139 Encounter for screening, unspecified: Secondary | ICD-10-CM | POA: Diagnosis not present

## 2020-06-22 DIAGNOSIS — Z0000 Encounter for general adult medical examination without abnormal findings: Secondary | ICD-10-CM | POA: Diagnosis not present

## 2020-06-26 DIAGNOSIS — N400 Enlarged prostate without lower urinary tract symptoms: Secondary | ICD-10-CM | POA: Diagnosis not present

## 2020-06-26 DIAGNOSIS — E119 Type 2 diabetes mellitus without complications: Secondary | ICD-10-CM | POA: Diagnosis not present

## 2020-06-26 DIAGNOSIS — E785 Hyperlipidemia, unspecified: Secondary | ICD-10-CM | POA: Diagnosis not present

## 2020-06-26 DIAGNOSIS — Z8249 Family history of ischemic heart disease and other diseases of the circulatory system: Secondary | ICD-10-CM | POA: Diagnosis not present

## 2020-06-26 DIAGNOSIS — Z85828 Personal history of other malignant neoplasm of skin: Secondary | ICD-10-CM | POA: Diagnosis not present

## 2020-06-26 DIAGNOSIS — I10 Essential (primary) hypertension: Secondary | ICD-10-CM | POA: Diagnosis not present

## 2020-06-26 DIAGNOSIS — Z6835 Body mass index (BMI) 35.0-35.9, adult: Secondary | ICD-10-CM | POA: Diagnosis not present

## 2020-06-26 DIAGNOSIS — Z833 Family history of diabetes mellitus: Secondary | ICD-10-CM | POA: Diagnosis not present

## 2020-06-27 DIAGNOSIS — K649 Unspecified hemorrhoids: Secondary | ICD-10-CM | POA: Diagnosis not present

## 2020-07-07 DIAGNOSIS — E119 Type 2 diabetes mellitus without complications: Secondary | ICD-10-CM | POA: Diagnosis not present

## 2020-07-07 DIAGNOSIS — R5383 Other fatigue: Secondary | ICD-10-CM | POA: Diagnosis not present

## 2020-07-07 DIAGNOSIS — G4733 Obstructive sleep apnea (adult) (pediatric): Secondary | ICD-10-CM | POA: Diagnosis not present

## 2020-07-07 DIAGNOSIS — I10 Essential (primary) hypertension: Secondary | ICD-10-CM | POA: Diagnosis not present

## 2020-07-07 DIAGNOSIS — I2510 Atherosclerotic heart disease of native coronary artery without angina pectoris: Secondary | ICD-10-CM | POA: Diagnosis not present

## 2020-07-07 DIAGNOSIS — Z952 Presence of prosthetic heart valve: Secondary | ICD-10-CM | POA: Diagnosis not present

## 2020-07-07 DIAGNOSIS — M545 Low back pain, unspecified: Secondary | ICD-10-CM | POA: Diagnosis not present

## 2020-07-07 DIAGNOSIS — I69320 Aphasia following cerebral infarction: Secondary | ICD-10-CM | POA: Diagnosis not present

## 2020-07-07 DIAGNOSIS — E781 Pure hyperglyceridemia: Secondary | ICD-10-CM | POA: Diagnosis not present

## 2020-08-07 DIAGNOSIS — I10 Essential (primary) hypertension: Secondary | ICD-10-CM | POA: Diagnosis not present

## 2020-08-07 DIAGNOSIS — I2510 Atherosclerotic heart disease of native coronary artery without angina pectoris: Secondary | ICD-10-CM | POA: Diagnosis not present

## 2020-08-07 DIAGNOSIS — I69320 Aphasia following cerebral infarction: Secondary | ICD-10-CM | POA: Diagnosis not present

## 2020-08-07 DIAGNOSIS — N410 Acute prostatitis: Secondary | ICD-10-CM | POA: Diagnosis not present

## 2020-08-07 DIAGNOSIS — E781 Pure hyperglyceridemia: Secondary | ICD-10-CM | POA: Diagnosis not present

## 2020-08-07 DIAGNOSIS — Z952 Presence of prosthetic heart valve: Secondary | ICD-10-CM | POA: Diagnosis not present

## 2020-08-07 DIAGNOSIS — E119 Type 2 diabetes mellitus without complications: Secondary | ICD-10-CM | POA: Diagnosis not present

## 2020-08-07 DIAGNOSIS — R5383 Other fatigue: Secondary | ICD-10-CM | POA: Diagnosis not present

## 2020-08-07 DIAGNOSIS — G4733 Obstructive sleep apnea (adult) (pediatric): Secondary | ICD-10-CM | POA: Diagnosis not present

## 2020-09-04 DIAGNOSIS — K6289 Other specified diseases of anus and rectum: Secondary | ICD-10-CM | POA: Diagnosis not present

## 2020-09-04 DIAGNOSIS — K649 Unspecified hemorrhoids: Secondary | ICD-10-CM | POA: Diagnosis not present

## 2020-09-11 ENCOUNTER — Other Ambulatory Visit: Payer: Self-pay | Admitting: Surgery

## 2020-09-11 DIAGNOSIS — K6289 Other specified diseases of anus and rectum: Secondary | ICD-10-CM

## 2020-09-14 DIAGNOSIS — Z23 Encounter for immunization: Secondary | ICD-10-CM | POA: Diagnosis not present

## 2020-09-25 ENCOUNTER — Ambulatory Visit
Admission: RE | Admit: 2020-09-25 | Discharge: 2020-09-25 | Disposition: A | Discharge status: Home / Self Care | Payer: Medicare PPO | Source: Ambulatory Visit | Attending: Surgery | Admitting: Surgery

## 2020-09-25 ENCOUNTER — Other Ambulatory Visit: Payer: Self-pay

## 2020-09-25 DIAGNOSIS — K6289 Other specified diseases of anus and rectum: Secondary | ICD-10-CM

## 2020-09-25 DIAGNOSIS — R39198 Other difficulties with micturition: Secondary | ICD-10-CM | POA: Diagnosis not present

## 2020-09-25 DIAGNOSIS — R350 Frequency of micturition: Secondary | ICD-10-CM | POA: Diagnosis not present

## 2020-09-25 MED ORDER — IOPAMIDOL (ISOVUE-300) INJECTION 61%
100.0000 mL | Freq: Once | INTRAVENOUS | Status: AC | PRN
  Administered 2020-09-25: 100 mL via INTRAVENOUS

## 2020-09-26 ENCOUNTER — Other Ambulatory Visit: Payer: Self-pay | Admitting: Physician Assistant

## 2020-10-09 ENCOUNTER — Other Ambulatory Visit: Payer: Self-pay

## 2020-10-09 ENCOUNTER — Encounter: Payer: Self-pay | Admitting: Cardiology

## 2020-10-09 ENCOUNTER — Ambulatory Visit: Payer: Medicare PPO | Admitting: Cardiology

## 2020-10-09 VITALS — BP 142/78 | HR 62 | Ht 70.0 in | Wt 255.6 lb

## 2020-10-09 DIAGNOSIS — I10 Essential (primary) hypertension: Secondary | ICD-10-CM

## 2020-10-09 DIAGNOSIS — I77810 Thoracic aortic ectasia: Secondary | ICD-10-CM

## 2020-10-09 DIAGNOSIS — Z952 Presence of prosthetic heart valve: Secondary | ICD-10-CM | POA: Diagnosis not present

## 2020-10-09 DIAGNOSIS — E782 Mixed hyperlipidemia: Secondary | ICD-10-CM | POA: Diagnosis not present

## 2020-10-09 DIAGNOSIS — Z79899 Other long term (current) drug therapy: Secondary | ICD-10-CM | POA: Diagnosis not present

## 2020-10-09 DIAGNOSIS — E785 Hyperlipidemia, unspecified: Secondary | ICD-10-CM

## 2020-10-09 DIAGNOSIS — I2510 Atherosclerotic heart disease of native coronary artery without angina pectoris: Secondary | ICD-10-CM | POA: Diagnosis not present

## 2020-10-09 LAB — BASIC METABOLIC PANEL
BUN/Creatinine Ratio: 16 (ref 10–24)
BUN: 17 mg/dL (ref 8–27)
CO2: 23 mmol/L (ref 20–29)
Calcium: 9.1 mg/dL (ref 8.6–10.2)
Chloride: 98 mmol/L (ref 96–106)
Creatinine, Ser: 1.06 mg/dL (ref 0.76–1.27)
Glucose: 142 mg/dL — ABNORMAL HIGH (ref 65–99)
Potassium: 4.2 mmol/L (ref 3.5–5.2)
Sodium: 137 mmol/L (ref 134–144)
eGFR: 74 mL/min/{1.73_m2} (ref >59)

## 2020-10-09 LAB — LIPID PANEL
Chol/HDL Ratio: 3.7 ratio (ref 0.0–5.0)
Cholesterol, Total: 153 mg/dL (ref 100–199)
HDL: 41 mg/dL (ref >39)
LDL Chol Calc (NIH): 74 mg/dL (ref 0–99)
Triglycerides: 228 mg/dL — ABNORMAL HIGH (ref 0–149)
VLDL Cholesterol Cal: 38 mg/dL (ref 5–40)

## 2020-10-09 MED ORDER — AMLODIPINE BESYLATE 2.5 MG PO TABS: 2.5000 mg | ORAL_TABLET | Freq: Every day | ORAL | 2 refills | Status: DC

## 2020-10-09 MED ORDER — SPIRONOLACTONE 25 MG PO TABS: 25.0000 mg | ORAL_TABLET | Freq: Every day | ORAL | 2 refills | Status: DC

## 2020-10-09 NOTE — Patient Instructions (Signed)
Medication Instructions:   START TAKING AMLODIPINE 2.5 MG BY MOUTH DAILY  INCREASE YOUR SPIRONOLACTONE TO 25 MG BY MOUTH DAILY  *If you need a refill on your cardiac medications before your next appointment, please call your pharmacy*   Lab Work:  TODAY--BMET AND LIPIDS  If you have labs (blood work) drawn today and your tests are completely normal, you will receive your results only by: Marland Kitchen MyChart Message (if you have MyChart) OR . A paper copy in the mail If you have any lab test that is abnormal or we need to change your treatment, we will call you to review the results.   Testing/Procedures:  Your physician has requested that you have an echocardiogram. Echocardiography is a painless test that uses sound waves to create images of your heart. It provides your doctor with information about the size and shape of your heart and how well your heart's chambers and valves are working. This procedure takes approximately one hour. There are no restrictions for this procedure.   Follow-Up:  6-8 MONTHS IN THE OFFICE WITH DR. Johney Frame

## 2020-10-09 NOTE — Progress Notes (Signed)
Cardiology Office Note:    Date:  10/09/2020   ID:  [name], DOB July 03, 1945, MRN [id number]  PCP:  Cyndi Bender, [place] Providers Cardiologist:  Ena Dawley, MD (Inactive) Structural Heart:  Lauree Chandler, MD{  Referring MD: Cyndi Bender, PA-C     History of Present Illness:    [name] is a 75 y.o. male with a hx of AS s/p TAVR 10/2018, CAD with mild to moderate disease on cath in 10/2018, prior CVA, HTN, HLD, OSA on CPAP, and history of seizures who was previously followed by Dr. Meda Coffee who now presents to clinic for follow-up.  Last saw Eugene Dopp, PA on 04/18/20 where he was doing well. Last TTE with normal LVEF, mild-to-moderate paravalvular leak (stable since 2020), ascending aortic dilation of 45mm. He had no HF symptoms.   Today, the patient feels well. He remains active and works in the garden without exertional chest pain or SOB. No LE edema, orthopnea, PND. Occasional lightheadedness when changing positions, but no syncope. Has been off plavix and only taking ASA 81mg  daily. Occasional bleeding hemorrhoid which is much better off the plavix. Blood pressures elevated at home mainly in 140s.  Past Medical History:  Diagnosis Date  . Aortic stenosis, severe   . Arthritis    "hands" (02/04/2018)  . Basal cell carcinoma    "MOHS surgery on sides of my nose" (02/04/2018)  . Chronic lower back pain   . CVA (cerebral vascular accident) ([place]) 01/29/2018  . CVA (cerebral vascular accident) ([place]) 02/01/2018  . Diarrhea   . Difficulty urinating   . Heart murmur    no problems  . High cholesterol   . Hypertension   . OSA on CPAP    "just got CPAP today" (02/04/2018)  . Rectal bleeding   . Rectal pain   . S/P TAVR (transcatheter aortic valve replacement) 11/03/2018   26 mm Edwards Sapien 3 transcatheter heart valve placed via percutaneous right transfemoral approach   . Stroke [facility])    "we were told that MRI showed previous stroke we were not  aware that he had" (02/04/2018)    Past Surgical History:  Procedure Laterality Date  . ANTERIOR CERVICAL DECOMP/DISCECTOMY FUSION  2009  . BACK SURGERY  1980's  . COLONOSCOPY     x 2  . EXCISIONAL HEMORRHOIDECTOMY  1989 -2011 X 2 or 3  . LAPAROSCOPIC CHOLECYSTECTOMY  2005  . LUMBAR DISC SURGERY  1980s X 2  . MOHS SURGERY Bilateral 2006   "sides of my nose" x 2  . RIGHT/LEFT HEART CATH AND CORONARY ANGIOGRAPHY N/A 10/21/2018   Procedure: RIGHT/LEFT HEART CATH AND CORONARY ANGIOGRAPHY;  Surgeon: Burnell Blanks, MD;  Location: [place] CV LAB;  Service: Cardiovascular;  Laterality: N/A;  . TEE WITHOUT CARDIOVERSION N/A 02/03/2018   Procedure: TRANSESOPHAGEAL ECHOCARDIOGRAM (TEE);  Surgeon: Lelon Perla, MD;  Location: [facility] ENDOSCOPY;  Service: Cardiovascular;  Laterality: N/A;  . TEE WITHOUT CARDIOVERSION N/A 11/03/2018   Procedure: TRANSESOPHAGEAL ECHOCARDIOGRAM (TEE);  Surgeon: Burnell Blanks, MD;  Location: [place] CV LAB;  Service: Open Heart Surgery;  Laterality: N/A;  . TRANSCATHETER AORTIC VALVE REPLACEMENT, TRANSFEMORAL N/A 11/03/2018   Procedure: TRANSCATHETER AORTIC VALVE REPLACEMENT, TRANSFEMORAL;  Surgeon: Burnell Blanks, MD;  Location: [place] CV LAB;  Service: Open Heart Surgery;  Laterality: N/A;  . WISDOM TOOTH EXTRACTION      Current Medications: Current Meds  Medication Sig  . acetaminophen (TYLENOL) 650 MG CR  tablet Take 650 mg by mouth every 8 (eight) hours as needed.  Marland Kitchen amLODipine (NORVASC) 2.5 MG tablet Take 1 tablet (2.5 mg total) by mouth daily.  Marland Kitchen amoxicillin (AMOXIL) 500 MG tablet TAKE 4 PILLS (2,000 MG) ONE HOUR PRIOR TO ALL DENTAL VISITS. THERE ARE ENOUGH FOR TWO VISITS IN THIS BOTTLE.  Marland Kitchen aspirin EC 81 MG EC tablet Take 1 tablet (81 mg total) by mouth daily.  . carvedilol (COREG) 3.125 MG tablet TAKE 1 TABLET BY MOUTH TWICE A DAY  . dutasteride (AVODART) 0.5 MG capsule Take 0.5 mg by mouth daily.  . hydrochlorothiazide  (HYDRODIURIL) 25 MG tablet TAKE 1 TABLET BY MOUTH EVERY DAY  . losartan (COZAAR) 100 MG tablet Take 1 tablet (100 mg total) by mouth daily.  . rosuvastatin (CRESTOR) 20 MG tablet Take 1 tablet (20 mg total) by mouth daily.  Marland Kitchen spironolactone (ALDACTONE) 25 MG tablet Take 1 tablet (25 mg total) by mouth daily.  . tamsulosin (FLOMAX) 0.4 MG CAPS capsule Take 0.4 mg by mouth daily.  . [DISCONTINUED] spironolactone (ALDACTONE) 25 MG tablet TAKE 1/2 TABLET BY MOUTH EVERY DAY     Allergies:   Atorvastatin   Social History   Socioeconomic History  . Marital status: Married    Spouse name: Not on file  . Number of children: Not on file  . Years of education: Not on file  . Highest education level: Not on file  Occupational History  . Occupation: retired  Tobacco Use  . Smoking status: Never Smoker  . Smokeless tobacco: Never Used  Vaping Use  . Vaping Use: Never used  Substance and Sexual Activity  . Alcohol use: Not Currently    Alcohol/week: 0.0 standard drinks  . Drug use: Not Currently  . Sexual activity: Not Currently  Other Topics Concern  . Not on file  Social History Narrative   Lives with his wife in [place], [place]   Social Determinants of Health   Financial Resource Strain: Not on file  Food Insecurity: Not on file  Transportation Needs: Not on file  Physical Activity: Not on file  Stress: Not on file  Social Connections: Not on file     Family History: The patient's family history includes Cancer in his mother; Heart disease in his father. There is no history of Stroke.  ROS:   Please see the history of present illness.    Review of Systems  Constitutional: Negative for chills and fever.  HENT: Negative for sore throat.   Eyes: Negative for blurred vision.  Respiratory: Negative for shortness of breath.   Cardiovascular: Negative for chest pain, palpitations, orthopnea, claudication, leg swelling and PND.  Gastrointestinal: Negative for nausea and vomiting.   Genitourinary: Negative for flank pain and hematuria.  Musculoskeletal: Negative for falls.  Neurological: Negative for dizziness and loss of consciousness.  Psychiatric/Behavioral: Negative for substance abuse.    EKGs/Labs/Other Studies Reviewed:    The following studies were reviewed today: Echocardiogram 11/03/19 IMPRESSIONS  1. Left ventricular ejection fraction, by estimation, is 60 to 65%. The  left ventricle has normal function. The left ventricle has no regional  wall motion abnormalities. There is mild concentric left ventricular  hypertrophy. Left ventricular diastolic  parameters are consistent with Grade I diastolic dysfunction (impaired  relaxation). Elevated left atrial pressure.  2. Right ventricular systolic function is normal. The right ventricular  size is normal.  3. Left atrial size was mildly dilated.  4. The mitral valve is normal in structure. Trivial mitral  valve  regurgitation.  5. The aortic valve has been repaired/replaced. Aortic valve  regurgitation is mild to moderate. There is a 26 mm Edwards Sapien  prosthetic (TAVR) valve present in the aortic position. Procedure Date:  11/03/18. Echo findings are consistent with  perivalvular leak of the aortic prosthesis. Aortic valve mean gradient  measures 16.0 mmHg. Aortic valve Vmax measures 2.87 m/s.  6. Aortic dilatation noted. There is mild dilatation of the ascending  aorta measuring 42 mm.   Cardiac catheterization 10/21/2018 LAD dist 20 LCx ost 20, mid 20 RCA mid 40   07/08/19: CTA NECK FINDINGS  Aortic arch: Great vessel origins are patent.  Right carotid system: Patent.  No measurable stenosis.  Left carotid system: Patent. There is mild noncalcified plaque at the ICA origin causing minimal stenosis.  Vertebral arteries: Patent and codominant.  Skeleton: Postoperative changes of anterior fusion at C4-C6. Cervical spine degenerative changes.  Other neck: No mass or  adenopathy.  Upper chest: No apical lung mass.  Review of the MIP images confirms the above findings  CTA HEAD FINDINGS  Anterior circulation: Intracranial internal carotid arteries are patent with mild calcified plaque. Anterior and middle cerebral arteries are patent.  Posterior circulation: Intracranial vertebral arteries, basilar artery, and posterior cerebral arteries are patent  Venous sinuses: As permitted by contrast timing, patent.  Review of the MIP images confirms the above findings  IMPRESSION: No acute intracranial abnormality. Several chronic infarcts. 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  No large vessel occlusion, hemodynamically significant stenosis, or evidence of dissection.  EKG:  EKG not done today  Recent Labs: 06/13/2020: BUN 12; Creatinine, Ser 1.18; Hemoglobin 14.3; Platelets 190; Potassium 3.7; Sodium 134  Recent Lipid Panel    Component Value Date/Time   CHOL 118 04/06/2018 0813   TRIG 175 (H) 04/06/2018 0813   HDL 38 (L) 04/06/2018 0813   CHOLHDL 3.1 04/06/2018 0813   CHOLHDL 6.9 01/30/2018 0347   VLDL 64 (H) 01/30/2018 0347   LDLCALC 45 04/06/2018 0813      Physical Exam:    VS:  BP (!) 142/78   Pulse 62   Ht 5\' 10"  (1.778 m)   Wt 255 lb 9.6 oz (115.9 kg)   SpO2 96%   BMI 36.67 kg/m     Wt Readings from Last 3 Encounters:  10/09/20 255 lb 9.6 oz (115.9 kg)  06/13/20 250 lb (113.4 kg)  04/18/20 256 lb (116.1 kg)     GEN:  Well nourished, well developed in no acute distress HEENT: Normal NECK: No JVD; No carotid bruits CARDIAC: RRR, 2/6 systolic murmur (early peaking). No rubs, gallops RESPIRATORY:  Clear to auscultation without rales, wheezing or rhonchi  ABDOMEN: Soft, non-tender, non-distended MUSCULOSKELETAL:  No edema; No deformity  SKIN: Warm and dry NEUROLOGIC:  Alert and oriented x 3 PSYCHIATRIC:  Normal affect   ASSESSMENT:    1. S/P TAVR (transcatheter aortic valve  replacement)   2. Essential hypertension   3. Coronary artery disease involving native coronary artery of native heart without angina pectoris   4. Mixed hyperlipidemia   5. Hyperlipidemia, unspecified hyperlipidemia type   6. Hyperlipidemia LDL goal <70   7. Medication management   8. Ascending aorta dilation (HCC)    PLAN:    In order of problems listed above:  #Severe AS s/p TAVR: Stable on TTE with LVEF 60-65%, well seated TAVR valve with mild-to-moderate paravalvular leak. No anginal or HF symptoms. -Repeat TTE this year for monitoring -Continue  IE ppx prior to dental procedure  #Mild-to-moderate CAD without angina: No anginal symptoms. -Continue ASA 81mg  daily -Continue crestor 20mg  daily -Continue coreg 3.125mg  BID -Continue losartan 100mg  daily  #HTN: Blood pressures running 140s at home.  -Continue HCTZ 25mg  daily -Continue spironolactone 25mg  daily -Continue losartan 100mg  daily -Continue coreg 3.125mg  BID -Start amlodipine 2.5mg  daily -BMET today  #HLD: LDL 45 in 04/2018. -Continue crestor 20mg  daily -Repeat lipids today  #Mild Ascending Aorta Dilation: Measures 59mm on TTE 11/02/20.  -Repeat TTE for monitoring     Medication Adjustments/Labs and Tests Ordered: Current medicines are reviewed at length with the patient today.  Concerns regarding medicines are outlined above.  Orders Placed This Encounter  Procedures  . Basic metabolic panel  . Lipid panel  . ECHOCARDIOGRAM COMPLETE   Meds ordered this encounter  Medications  . spironolactone (ALDACTONE) 25 MG tablet    Sig: Take 1 tablet (25 mg total) by mouth daily.    Dispense:  90 tablet    Refill:  2    DOSE INCREASE  . amLODipine (NORVASC) 2.5 MG tablet    Sig: Take 1 tablet (2.5 mg total) by mouth daily.    Dispense:  90 tablet    Refill:  2    Patient Instructions  Medication Instructions:   START TAKING AMLODIPINE 2.5 MG BY MOUTH DAILY  INCREASE YOUR SPIRONOLACTONE TO 25 MG BY  MOUTH DAILY  *If you need a refill on your cardiac medications before your next appointment, please call your pharmacy*   Lab Work:  TODAY--BMET AND LIPIDS  If you have labs (blood work) drawn today and your tests are completely normal, you will receive your results only by: Marland Kitchen MyChart Message (if you have MyChart) OR . A paper copy in the mail If you have any lab test that is abnormal or we need to change your treatment, we will call you to review the results.   Testing/Procedures:  Your physician has requested that you have an echocardiogram. Echocardiography is a painless test that uses sound waves to create images of your heart. It provides your doctor with information about the size and shape of your heart and how well your heart's chambers and valves are working. This procedure takes approximately one hour. There are no restrictions for this procedure.   Follow-Up:  6-8 MONTHS IN THE OFFICE WITH DR. Johney Frame      Signed, Freada Bergeron, MD  10/09/2020 1:14 PM    [facility]

## 2020-11-08 ENCOUNTER — Other Ambulatory Visit: Payer: Self-pay

## 2020-11-08 ENCOUNTER — Ambulatory Visit (HOSPITAL_COMMUNITY): Payer: Medicare PPO | Attending: Cardiovascular Disease

## 2020-11-08 DIAGNOSIS — Z952 Presence of prosthetic heart valve: Secondary | ICD-10-CM | POA: Diagnosis not present

## 2020-11-08 LAB — ECHOCARDIOGRAM COMPLETE
AR max vel: 1.36 cm2
AV Area VTI: 1.54 cm2
AV Area mean vel: 1.32 cm2
AV Mean grad: 16 mmHg
AV Peak grad: 32.1 mmHg
Ao pk vel: 2.84 m/s
Area-P 1/2: 3.76 cm2
P 1/2 time: 568 msec
S' Lateral: 2.6 cm

## 2020-11-10 DIAGNOSIS — E781 Pure hyperglyceridemia: Secondary | ICD-10-CM | POA: Diagnosis not present

## 2020-11-10 DIAGNOSIS — I69320 Aphasia following cerebral infarction: Secondary | ICD-10-CM | POA: Diagnosis not present

## 2020-11-10 DIAGNOSIS — Z952 Presence of prosthetic heart valve: Secondary | ICD-10-CM | POA: Diagnosis not present

## 2020-11-10 DIAGNOSIS — N400 Enlarged prostate without lower urinary tract symptoms: Secondary | ICD-10-CM | POA: Diagnosis not present

## 2020-11-10 DIAGNOSIS — E119 Type 2 diabetes mellitus without complications: Secondary | ICD-10-CM | POA: Diagnosis not present

## 2020-11-10 DIAGNOSIS — I10 Essential (primary) hypertension: Secondary | ICD-10-CM | POA: Diagnosis not present

## 2020-11-10 DIAGNOSIS — I2510 Atherosclerotic heart disease of native coronary artery without angina pectoris: Secondary | ICD-10-CM | POA: Diagnosis not present

## 2020-11-10 DIAGNOSIS — M545 Low back pain, unspecified: Secondary | ICD-10-CM | POA: Diagnosis not present

## 2020-11-10 DIAGNOSIS — G4733 Obstructive sleep apnea (adult) (pediatric): Secondary | ICD-10-CM | POA: Diagnosis not present

## 2020-11-20 ENCOUNTER — Ambulatory Visit
Admission: RE | Admit: 2020-11-20 | Discharge: 2020-11-20 | Disposition: A | Discharge status: Home / Self Care | Payer: Medicare PPO | Source: Ambulatory Visit | Attending: Physician Assistant | Admitting: Physician Assistant

## 2020-11-20 ENCOUNTER — Other Ambulatory Visit: Payer: Self-pay | Admitting: Physician Assistant

## 2020-11-20 DIAGNOSIS — K6289 Other specified diseases of anus and rectum: Secondary | ICD-10-CM

## 2020-11-20 DIAGNOSIS — R109 Unspecified abdominal pain: Secondary | ICD-10-CM | POA: Diagnosis not present

## 2020-11-21 DIAGNOSIS — G4733 Obstructive sleep apnea (adult) (pediatric): Secondary | ICD-10-CM | POA: Diagnosis not present

## 2020-12-03 ENCOUNTER — Other Ambulatory Visit: Payer: Self-pay | Admitting: Physician Assistant

## 2020-12-06 ENCOUNTER — Other Ambulatory Visit: Payer: Self-pay | Admitting: *Deleted

## 2020-12-06 MED ORDER — HYDROCHLOROTHIAZIDE 25 MG PO TABS: 25.0000 mg | ORAL_TABLET | Freq: Every day | ORAL | 2 refills | Status: DC

## 2020-12-07 DIAGNOSIS — K6289 Other specified diseases of anus and rectum: Secondary | ICD-10-CM | POA: Diagnosis not present

## 2020-12-13 DIAGNOSIS — R351 Nocturia: Secondary | ICD-10-CM | POA: Diagnosis not present

## 2020-12-13 DIAGNOSIS — R3914 Feeling of incomplete bladder emptying: Secondary | ICD-10-CM | POA: Diagnosis not present

## 2020-12-13 DIAGNOSIS — R3915 Urgency of urination: Secondary | ICD-10-CM | POA: Diagnosis not present

## 2020-12-13 DIAGNOSIS — R3912 Poor urinary stream: Secondary | ICD-10-CM | POA: Diagnosis not present

## 2020-12-13 DIAGNOSIS — N401 Enlarged prostate with lower urinary tract symptoms: Secondary | ICD-10-CM | POA: Diagnosis not present

## 2021-01-05 DIAGNOSIS — K921 Melena: Secondary | ICD-10-CM | POA: Diagnosis not present

## 2021-01-05 DIAGNOSIS — K6289 Other specified diseases of anus and rectum: Secondary | ICD-10-CM | POA: Diagnosis not present

## 2021-01-09 DIAGNOSIS — Z6838 Body mass index (BMI) 38.0-38.9, adult: Secondary | ICD-10-CM | POA: Diagnosis not present

## 2021-01-09 DIAGNOSIS — N451 Epididymitis: Secondary | ICD-10-CM | POA: Diagnosis not present

## 2021-02-14 DIAGNOSIS — E119 Type 2 diabetes mellitus without complications: Secondary | ICD-10-CM | POA: Diagnosis not present

## 2021-02-14 DIAGNOSIS — I69320 Aphasia following cerebral infarction: Secondary | ICD-10-CM | POA: Diagnosis not present

## 2021-02-14 DIAGNOSIS — Z952 Presence of prosthetic heart valve: Secondary | ICD-10-CM | POA: Diagnosis not present

## 2021-02-14 DIAGNOSIS — G4733 Obstructive sleep apnea (adult) (pediatric): Secondary | ICD-10-CM | POA: Diagnosis not present

## 2021-02-14 DIAGNOSIS — I2510 Atherosclerotic heart disease of native coronary artery without angina pectoris: Secondary | ICD-10-CM | POA: Diagnosis not present

## 2021-02-14 DIAGNOSIS — Z23 Encounter for immunization: Secondary | ICD-10-CM | POA: Diagnosis not present

## 2021-02-14 DIAGNOSIS — N400 Enlarged prostate without lower urinary tract symptoms: Secondary | ICD-10-CM | POA: Diagnosis not present

## 2021-02-14 DIAGNOSIS — I10 Essential (primary) hypertension: Secondary | ICD-10-CM | POA: Diagnosis not present

## 2021-02-14 DIAGNOSIS — E781 Pure hyperglyceridemia: Secondary | ICD-10-CM | POA: Diagnosis not present

## 2021-03-13 NOTE — Progress Notes (Deleted)
Cardiology Office Note:    Date:  03/13/2021   ID:  [name], DOB 1945-11-28, MRN [id number]  PCP:  Cyndi Bender, [place] Providers Cardiologist:  Ena Dawley, MD (Inactive) Structural Heart:  Lauree Chandler, MD {  Referring MD: Cyndi Bender, PA-C     History of Present Illness:    [name] is a 75 y.o. male with a hx of AS s/p 42mm S3 TAVR 10/2018, CAD with mild to moderate disease on cath in 10/2018, prior CVA, HTN, HLD, OSA on CPAP, and history of seizures who was previously followed by Dr. Meda Coffee who now presents to clinic for follow-up.  Last seen in clinic on 10/09/20 where he was doing well without anginal or HF symptoms. TTE 11/08/20 with normal LVEF 60-65%, mild paravalvular leak (stable since 2020), MG 43mmHg, DI 0.31 ascending aortic dilation of 25mm. He had no HF symptoms.   Today ***  Past Medical History:  Diagnosis Date   Aortic stenosis, severe    Arthritis    "hands" (02/04/2018)   Basal cell carcinoma    "MOHS surgery on sides of my nose" (02/04/2018)   Chronic lower back pain    CVA (cerebral vascular accident) ([place]) 01/29/2018   CVA (cerebral vascular accident) ([place]) 02/01/2018   Diarrhea    Difficulty urinating    Heart murmur    no problems   High cholesterol    Hypertension    OSA on CPAP    "just got CPAP today" (02/04/2018)   Rectal bleeding    Rectal pain    S/P TAVR (transcatheter aortic valve replacement) 11/03/2018   26 mm Edwards Sapien 3 transcatheter heart valve placed via percutaneous right transfemoral approach    Stroke ([place])    "we were told that MRI showed previous stroke we were not aware that he had" (02/04/2018)    Past Surgical History:  Procedure Laterality Date   ANTERIOR CERVICAL DECOMP/DISCECTOMY FUSION  2009   BACK SURGERY  1980's   COLONOSCOPY     x 2   EXCISIONAL HEMORRHOIDECTOMY  1989 -2011 X 2 or 3   LAPAROSCOPIC CHOLECYSTECTOMY  2005   LUMBAR [place] X 2   MOHS SURGERY  Bilateral 2006   "sides of my nose" x 2   RIGHT/LEFT HEART CATH AND CORONARY ANGIOGRAPHY N/A 10/21/2018   Procedure: RIGHT/LEFT HEART CATH AND CORONARY ANGIOGRAPHY;  Surgeon: Burnell Blanks, MD;  Location: [place] CV LAB;  Service: Cardiovascular;  Laterality: N/A;   TEE WITHOUT CARDIOVERSION N/A 02/03/2018   Procedure: TRANSESOPHAGEAL ECHOCARDIOGRAM (TEE);  Surgeon: Lelon Perla, MD;  Location: [facility] ENDOSCOPY;  Service: Cardiovascular;  Laterality: N/A;   TEE WITHOUT CARDIOVERSION N/A 11/03/2018   Procedure: TRANSESOPHAGEAL ECHOCARDIOGRAM (TEE);  Surgeon: Burnell Blanks, MD;  Location: [place] CV LAB;  Service: Open Heart Surgery;  Laterality: N/A;   TRANSCATHETER AORTIC VALVE REPLACEMENT, TRANSFEMORAL N/A 11/03/2018   Procedure: TRANSCATHETER AORTIC VALVE REPLACEMENT, TRANSFEMORAL;  Surgeon: Burnell Blanks, MD;  Location: [place] CV LAB;  Service: Open Heart Surgery;  Laterality: N/A;   WISDOM TOOTH EXTRACTION      Current Medications: No outpatient medications have been marked as taking for the 03/27/21 encounter (Appointment) with Freada Bergeron, MD.     Allergies:   Atorvastatin   Social History   Socioeconomic History   Marital status: Married    Spouse name: Not on file   Number of children: Not on file   Years  of education: Not on file   Highest education level: Not on file  Occupational History   Occupation: retired  Tobacco Use   Smoking status: Never   Smokeless tobacco: Never  Vaping Use   Vaping Use: Never used  Substance and Sexual Activity   Alcohol use: Not Currently    Alcohol/week: 0.0 standard drinks   Drug use: Not Currently   Sexual activity: Not Currently  Other Topics Concern   Not on file  Social History Narrative   Lives with his wife in [place], [place]   Social Determinants of Health   Financial Resource Strain: Not on file  Food Insecurity: Not on file  Transportation Needs: Not on file  Physical  Activity: Not on file  Stress: Not on file  Social Connections: Not on file     Family History: The patient's family history includes Cancer in his mother; Heart disease in his father. There is no history of Stroke.  ROS:   Please see the history of present illness.    Review of Systems  Constitutional:  Negative for chills and fever.  HENT:  Negative for sore throat.   Eyes:  Negative for blurred vision.  Respiratory:  Negative for shortness of breath.   Cardiovascular:  Negative for chest pain, palpitations, orthopnea, claudication, leg swelling and PND.  Gastrointestinal:  Negative for nausea and vomiting.  Genitourinary:  Negative for flank pain and hematuria.  Musculoskeletal:  Negative for falls.  Neurological:  Negative for dizziness and loss of consciousness.  Psychiatric/Behavioral:  Negative for substance abuse.    EKGs/Labs/Other Studies Reviewed:    The following studies were reviewed today: TTE 15-Nov-2020: IMPRESSIONS     1. 26 mm S3 (11/03/2018). V max 2.8 m/s, MG 16 mmHG, EOA 1.54 cm2, DI  0.31. There is mild paravalvular leak in the 11-1 o'clock position. PVL  has not changed from prior. The aortic valve has been repaired/replaced.  Aortic valve regurgitation is mild.  There is a 26 mm Sapien prosthetic (TAVR) valve present in the aortic  position. Procedure Date: 11/03/2018.   2. Left ventricular ejection fraction, by estimation, is 60 to 65%. The  left ventricle has normal function. The left ventricle has no regional  wall motion abnormalities. There is moderate concentric left ventricular  hypertrophy. Left ventricular  diastolic parameters are consistent with Grade I diastolic dysfunction  (impaired relaxation).   3. Right ventricular systolic function is normal. The right ventricular  size is normal. Tricuspid regurgitation signal is inadequate for assessing  PA pressure.   4. The mitral valve is grossly normal. Trivial mitral valve  regurgitation. No  evidence of mitral stenosis.   5. The inferior vena cava is normal in size with greater than 50%  respiratory variability, suggesting right atrial pressure of 3 mmHg.   Comparison(s): No significant change from prior study. AoV prosthesis  remains unchanged in gradient and PVL.  Echocardiogram 11/03/19 IMPRESSIONS   1. Left ventricular ejection fraction, by estimation, is 60 to 65%. The  left ventricle has normal function. The left ventricle has no regional  wall motion abnormalities. There is mild concentric left ventricular  hypertrophy. Left ventricular diastolic  parameters are consistent with Grade I diastolic dysfunction (impaired  relaxation). Elevated left atrial pressure.   2. Right ventricular systolic function is normal. The right ventricular  size is normal.   3. Left atrial size was mildly dilated.   4. The mitral valve is normal in structure. Trivial mitral valve  regurgitation.  5. The aortic valve has been repaired/replaced. Aortic valve  regurgitation is mild to moderate. There is a 26 mm Edwards Sapien  prosthetic (TAVR) valve present in the aortic position. Procedure Date:  11/03/18. Echo findings are consistent with  perivalvular leak of the aortic prosthesis. Aortic valve mean gradient  measures 16.0 mmHg. Aortic valve Vmax measures 2.87 m/s.   6. Aortic dilatation noted. There is mild dilatation of the ascending  aorta measuring 42 mm.    Cardiac catheterization 10/21/2018 LAD dist 20 LCx ost 20, mid 20 RCA mid 40   07/08/19: CTA NECK FINDINGS   Aortic arch: Great vessel origins are patent.   Right carotid system: Patent.  No measurable stenosis.   Left carotid system: Patent. There is mild noncalcified plaque at the ICA origin causing minimal stenosis.   Vertebral arteries: Patent and codominant.   Skeleton: Postoperative changes of anterior fusion at C4-C6. Cervical spine degenerative changes.   Other neck: No mass or adenopathy.   Upper chest:  No apical lung mass.   Review of the MIP images confirms the above findings   CTA HEAD FINDINGS   Anterior circulation: Intracranial internal carotid arteries are patent with mild calcified plaque. Anterior and middle cerebral arteries are patent.   Posterior circulation: Intracranial vertebral arteries, basilar artery, and posterior cerebral arteries are patent   Venous sinuses: As permitted by contrast timing, patent.   Review of the MIP images confirms the above findings   IMPRESSION: No acute intracranial abnormality. Several chronic infarcts. 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   No large vessel occlusion, hemodynamically significant stenosis, or evidence of dissection.  EKG:  EKG not done today  Recent Labs: 06/13/2020: Hemoglobin 14.3; Platelets 190 10/09/2020: BUN 17; Creatinine, Ser 1.06; Potassium 4.2; Sodium 137  Recent Lipid Panel    Component Value Date/Time   CHOL 153 10/09/2020 1058   TRIG 228 (H) 10/09/2020 1058   HDL 41 10/09/2020 1058   CHOLHDL 3.7 10/09/2020 1058   CHOLHDL 6.9 01/30/2018 0347   VLDL 64 (H) 01/30/2018 0347   LDLCALC 74 10/09/2020 1058      Physical Exam:    VS:  There were no vitals taken for this visit.    Wt Readings from Last 3 Encounters:  10/09/20 255 lb 9.6 oz (115.9 kg)  06/13/20 250 lb (113.4 kg)  04/18/20 256 lb (116.1 kg)     GEN:  Well nourished, well developed in no acute distress HEENT: Normal NECK: No JVD; No carotid bruits CARDIAC: RRR, 2/6 systolic murmur (early peaking). No rubs, gallops RESPIRATORY:  Clear to auscultation without rales, wheezing or rhonchi  ABDOMEN: Soft, non-tender, non-distended MUSCULOSKELETAL:  No edema; No deformity  SKIN: Warm and dry NEUROLOGIC:  Alert and oriented x 3 PSYCHIATRIC:  Normal affect   ASSESSMENT:    No diagnosis found.  PLAN:    In order of problems listed above:  #Severe AS s/p TAVR: Stable on TTE with LVEF 60-65%, well seated  TAVR valve with mild paravalvular leak, mean gradient 22mmHg, DI 0.31. No anginal or HF symptoms. -Continue surveillance TTEs annually -Continue IE ppx prior to dental procedure  #Mild-to-moderate CAD without angina: No anginal symptoms. -Continue ASA 81mg  daily -Continue crestor 20mg  daily -Continue coreg 3.125mg  BID -Continue losartan 100mg  daily  #HTN: -Continue HCTZ 25mg  daily -Continue spironolactone 25mg  daily -Continue losartan 100mg  daily -Continue coreg 3.125mg  BID -Continue amlodipine 2.5mg  daily  #HLD: LDL 74.  -Continue crestor 20mg  daily; may need to increase as goal  LDL <74 -Repeat lipids  -Diet and lifestyle modifications  #Mild Ascending Aorta Dilation: Measures 7mm on TTE 11/03/19, however appeared normal in 11/2020. CTA in 2020 normal. -No further monitoring needed as appeared normal on TTE in 11/2020 and CTA chest in 2020     Medication Adjustments/Labs and Tests Ordered: Current medicines are reviewed at length with the patient today.  Concerns regarding medicines are outlined above.  No orders of the defined types were placed in this encounter.  No orders of the defined types were placed in this encounter.   There are no Patient Instructions on file for this visit.    Signed, Freada Bergeron, MD  03/13/2021 8:57 PM    [place]

## 2021-03-27 ENCOUNTER — Ambulatory Visit: Payer: Medicare PPO | Admitting: Cardiology

## 2021-03-27 ENCOUNTER — Other Ambulatory Visit: Payer: Self-pay

## 2021-03-27 ENCOUNTER — Encounter: Payer: Self-pay | Admitting: Cardiology

## 2021-03-27 DIAGNOSIS — I2510 Atherosclerotic heart disease of native coronary artery without angina pectoris: Secondary | ICD-10-CM | POA: Diagnosis not present

## 2021-03-27 DIAGNOSIS — I10 Essential (primary) hypertension: Secondary | ICD-10-CM

## 2021-03-27 DIAGNOSIS — E782 Mixed hyperlipidemia: Secondary | ICD-10-CM | POA: Diagnosis not present

## 2021-03-27 DIAGNOSIS — Z952 Presence of prosthetic heart valve: Secondary | ICD-10-CM | POA: Diagnosis not present

## 2021-03-27 DIAGNOSIS — E785 Hyperlipidemia, unspecified: Secondary | ICD-10-CM

## 2021-03-27 MED ORDER — SPIRONOLACTONE 25 MG PO TABS: 25.0000 mg | ORAL_TABLET | Freq: Every day | ORAL | 2 refills | Status: DC

## 2021-03-27 MED ORDER — CARVEDILOL 3.125 MG PO TABS: 3.1250 mg | ORAL_TABLET | Freq: Two times a day (BID) | ORAL | 2 refills | Status: DC

## 2021-03-27 MED ORDER — HYDROCHLOROTHIAZIDE 25 MG PO TABS: 25.0000 mg | ORAL_TABLET | Freq: Every day | ORAL | 2 refills | Status: DC

## 2021-03-27 MED ORDER — AMLODIPINE BESYLATE 5 MG PO TABS: 5.0000 mg | ORAL_TABLET | Freq: Every day | ORAL | 3 refills | Status: DC

## 2021-03-27 MED ORDER — ROSUVASTATIN CALCIUM 20 MG PO TABS: 20.0000 mg | ORAL_TABLET | Freq: Every day | ORAL | 3 refills | Status: DC

## 2021-03-27 NOTE — Patient Instructions (Signed)
Medication Instructions:   INCREASE YOUR AMLODIPINE TO 5 MG BY MOUTH DAILY   *If you need a refill on your cardiac medications before your next appointment, please call your pharmacy*   Follow-Up: At [facility] ([place]), you and your health needs are our priority.  As part of our continuing mission to provide you with exceptional heart care, we have created designated Provider Care Teams.  These Care Teams include your primary Cardiologist (physician) and Advanced Practice Providers (APPs -  Physician Assistants and Nurse Practitioners) who all work together to provide you with the care you need, when you need it.  We recommend signing up for the patient portal called "MyChart".  Sign up information is provided on this After Visit Summary.  MyChart is used to connect with patients for Virtual Visits (Telemedicine).  Patients are able to view lab/test results, encounter notes, upcoming appointments, etc.  Non-urgent messages can be sent to your provider as well.   To learn more about what you can do with MyChart, go to [email or web address].    Your next appointment:   6 month(s)  The format for your next appointment:   In Person  Provider:    DR. Johney Frame

## 2021-03-27 NOTE — Progress Notes (Signed)
Cardiology Office Note:    Date:  03/27/2021   ID:  [name], DOB 1945-06-02, MRN [id number]  PCP:  Cyndi Bender, [place] Providers Cardiologist:  Ena Dawley, MD (Inactive) Structural Heart:  Lauree Chandler, MD {  Referring MD: Cyndi Bender, PA-C     History of Present Illness:    [name] is a 75 y.o. male with a hx of AS s/p 70mm S3 TAVR 10/2018, CAD with mild to moderate disease on cath in 10/2018, prior CVA, HTN, HLD, OSA on CPAP, and history of seizures who was previously followed by Dr. Meda Coffee who now presents to clinic for follow-up.  Last seen in clinic on 10/09/20 where he was doing well without anginal or HF symptoms. TTE 11/08/20 with normal LVEF 60-65%, mild paravalvular leak (stable since 2020), MG 51mmHg, DI 0.31 ascending aortic dilation of 54mm. He had no HF symptoms.   Today, he is feeling good overall.  He records his blood pressure at home and reports measurements around 242A to 834H systolic. He is tolerating the amlodipine with no LE swelling. Otherwise, he has no major concerns.   He does not perform formal exercise. However, he stays active by doing yard work at home. He endorses back pain which can cause him to stop and rest, but no chest pain or SOB.  He denies any palpitations, chest pain, or shortness of breath, lightheadedness, headaches, syncope, orthopnea, or PND.   Past Medical History:  Diagnosis Date   Aortic stenosis, severe    Arthritis    "hands" (02/04/2018)   Basal cell carcinoma    "MOHS surgery on sides of my nose" (02/04/2018)   Chronic lower back pain    CVA (cerebral vascular accident) ([place]) 01/29/2018   CVA (cerebral vascular accident) ([place]) 02/01/2018   Diarrhea    Difficulty urinating    Heart murmur    no problems   High cholesterol    Hypertension    OSA on CPAP    "just got CPAP today" (02/04/2018)   Rectal bleeding    Rectal pain    S/P TAVR (transcatheter aortic valve replacement)  11/03/2018   26 mm Edwards Sapien 3 transcatheter heart valve placed via percutaneous right transfemoral approach    Stroke ([place])    "we were told that MRI showed previous stroke we were not aware that he had" (02/04/2018)    Past Surgical History:  Procedure Laterality Date   ANTERIOR CERVICAL DECOMP/DISCECTOMY FUSION  2009   BACK SURGERY  1980's   COLONOSCOPY     x 2   EXCISIONAL HEMORRHOIDECTOMY  1989 -2011 X 2 or 3   LAPAROSCOPIC CHOLECYSTECTOMY  2005   LUMBAR [place] X 2   MOHS SURGERY Bilateral 2006   "sides of my nose" x 2   RIGHT/LEFT HEART CATH AND CORONARY ANGIOGRAPHY N/A 10/21/2018   Procedure: RIGHT/LEFT HEART CATH AND CORONARY ANGIOGRAPHY;  Surgeon: Burnell Blanks, MD;  Location: [facility];  Service: Cardiovascular;  Laterality: N/A;   TEE WITHOUT CARDIOVERSION N/A 02/03/2018   Procedure: TRANSESOPHAGEAL ECHOCARDIOGRAM (TEE);  Surgeon: Lelon Perla, MD;  Location: [facility];  Service: Cardiovascular;  Laterality: N/A;   TEE WITHOUT CARDIOVERSION N/A 11/03/2018   Procedure: TRANSESOPHAGEAL ECHOCARDIOGRAM (TEE);  Surgeon: Burnell Blanks, MD;  Location: [place] [facility];  Service: Open Heart Surgery;  Laterality: N/A;   TRANSCATHETER AORTIC VALVE REPLACEMENT, TRANSFEMORAL N/A 11/03/2018   Procedure: TRANSCATHETER AORTIC VALVE REPLACEMENT, TRANSFEMORAL;  Surgeon: Burnell Blanks, MD;  Location: [place] [facility];  Service: Open Heart Surgery;  Laterality: N/A;   WISDOM TOOTH EXTRACTION      Current Medications: Current Meds  Medication Sig   acetaminophen (TYLENOL) 650 MG CR tablet Take 650 mg by mouth every 8 (eight) hours as needed.   amLODipine (NORVASC) 5 MG tablet Take 1 tablet (5 mg total) by mouth daily.   amoxicillin (AMOXIL) 500 MG tablet TAKE 4 PILLS (2,000 MG) ONE HOUR PRIOR TO ALL DENTAL VISITS. THERE ARE ENOUGH FOR TWO VISITS IN THIS BOTTLE.   aspirin EC 81 MG EC tablet Take 1 tablet (81 mg total) by mouth  daily.   dutasteride (AVODART) 0.5 MG capsule Take 0.5 mg by mouth daily.   gabapentin (NEURONTIN) 100 MG capsule Take 100 mg by mouth at bedtime.   losartan (COZAAR) 100 MG tablet TAKE 1 TABLET BY MOUTH EVERY DAY   tamsulosin (FLOMAX) 0.4 MG CAPS capsule Take 0.4 mg by mouth daily.   [DISCONTINUED] amLODipine (NORVASC) 2.5 MG tablet Take 1 tablet (2.5 mg total) by mouth daily.   [DISCONTINUED] carvedilol (COREG) 3.125 MG tablet TAKE 1 TABLET BY MOUTH TWICE A DAY   [DISCONTINUED] hydrochlorothiazide (HYDRODIURIL) 25 MG tablet Take 1 tablet (25 mg total) by mouth daily.   [DISCONTINUED] rosuvastatin (CRESTOR) 20 MG tablet Take 1 tablet (20 mg total) by mouth daily.   [DISCONTINUED] spironolactone (ALDACTONE) 25 MG tablet Take 1 tablet (25 mg total) by mouth daily.     Allergies:   Atorvastatin   Social History   Socioeconomic History   Marital status: Married    Spouse name: Not on file   Number of children: Not on file   Years of education: Not on file   Highest education level: Not on file  Occupational History   Occupation: retired  Tobacco Use   Smoking status: Never   Smokeless tobacco: Never  Vaping Use   Vaping Use: Never used  Substance and Sexual Activity   Alcohol use: Not Currently    Alcohol/week: 0.0 standard drinks   Drug use: Not Currently   Sexual activity: Not Currently  Other Topics Concern   Not on file  Social History Narrative   Lives with his wife in [place], [place]   Social Determinants of Health   Financial Resource Strain: Not on file  Food Insecurity: Not on file  Transportation Needs: Not on file  Physical Activity: Not on file  Stress: Not on file  Social Connections: Not on file     Family History: The patient's family history includes Cancer in his mother; Heart disease in his father. There is no history of Stroke.  ROS:   Please see the history of present illness.    Review of Systems  Constitutional:  Negative for chills and fever.   HENT:  Negative for sore throat.   Eyes:  Negative for blurred vision.  Respiratory:  Negative for shortness of breath.   Cardiovascular:  Negative for chest pain, palpitations, orthopnea, claudication, leg swelling and PND.  Gastrointestinal:  Negative for nausea and vomiting.  Genitourinary:  Negative for flank pain and hematuria.  Musculoskeletal:  Positive for back pain. Negative for falls.  Skin:  Negative for itching and rash.  Neurological:  Negative for dizziness and loss of consciousness.  Endo/Heme/Allergies:  Negative for polydipsia. Does not bruise/bleed easily.  Psychiatric/Behavioral:  Negative for substance abuse.    EKGs/Labs/Other Studies Reviewed:    The following studies were reviewed today:  TTE 11/08/20: IMPRESSIONS   1. 26 mm S3 (11/03/2018). V max 2.8 m/s, MG 16 mmHG, EOA 1.54 cm2, DI  0.31. There is mild paravalvular leak in the 11-1 o'clock position. PVL  has not changed from prior. The aortic valve has been repaired/replaced.  Aortic valve regurgitation is mild.  There is a 26 mm Sapien prosthetic (TAVR) valve present in the aortic  position. Procedure Date: 11/03/2018.   2. Left ventricular ejection fraction, by estimation, is 60 to 65%. The  left ventricle has normal function. The left ventricle has no regional  wall motion abnormalities. There is moderate concentric left ventricular  hypertrophy. Left ventricular  diastolic parameters are consistent with Grade I diastolic dysfunction  (impaired relaxation).   3. Right ventricular systolic function is normal. The right ventricular  size is normal. Tricuspid regurgitation signal is inadequate for assessing  PA pressure.   4. The mitral valve is grossly normal. Trivial mitral valve  regurgitation. No evidence of mitral stenosis.   5. The inferior vena cava is normal in size with greater than 50%  respiratory variability, suggesting right atrial pressure of 3 mmHg.   Comparison(s): No significant change  from prior study. AoV prosthesis  remains unchanged in gradient and PVL.   Echocardiogram 11/03/19 IMPRESSIONS   1. Left ventricular ejection fraction, by estimation, is 60 to 65%. The  left ventricle has normal function. The left ventricle has no regional  wall motion abnormalities. There is mild concentric left ventricular  hypertrophy. Left ventricular diastolic  parameters are consistent with Grade I diastolic dysfunction (impaired  relaxation). Elevated left atrial pressure.   2. Right ventricular systolic function is normal. The right ventricular  size is normal.   3. Left atrial size was mildly dilated.   4. The mitral valve is normal in structure. Trivial mitral valve  regurgitation.   5. The aortic valve has been repaired/replaced. Aortic valve  regurgitation is mild to moderate. There is a 26 mm Edwards Sapien  prosthetic (TAVR) valve present in the aortic position. Procedure Date:  11/03/18. Echo findings are consistent with  perivalvular leak of the aortic prosthesis. Aortic valve mean gradient  measures 16.0 mmHg. Aortic valve Vmax measures 2.87 m/s.   6. Aortic dilatation noted. There is mild dilatation of the ascending  aorta measuring 42 mm.    Cardiac catheterization 10/21/2018 LAD dist 20 LCx ost 20, mid 20 RCA mid 40   07/08/19: CTA NECK FINDINGS   Aortic arch: Great vessel origins are patent.   Right carotid system: Patent.  No measurable stenosis.   Left carotid system: Patent. There is mild noncalcified plaque at the ICA origin causing minimal stenosis.   Vertebral arteries: Patent and codominant.   Skeleton: Postoperative changes of anterior fusion at C4-C6. Cervical spine degenerative changes.   Other neck: No mass or adenopathy.   Upper chest: No apical lung mass.   Review of the MIP images confirms the above findings   CTA HEAD FINDINGS   Anterior circulation: Intracranial internal carotid arteries are patent with mild calcified plaque.  Anterior and middle cerebral arteries are patent.   Posterior circulation: Intracranial vertebral arteries, basilar artery, and posterior cerebral arteries are patent   Venous sinuses: As permitted by contrast timing, patent.   Review of the MIP images confirms the above findings   IMPRESSION: No acute intracranial abnormality. Several chronic infarcts. 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   No large vessel occlusion, hemodynamically significant stenosis, or evidence of dissection.  EKG:  03/27/21: NSR, LAFB, rate 66 bpm  Recent Labs: 06/13/2020: Hemoglobin 14.3; Platelets 190 10/09/2020: BUN 17; Creatinine, Ser 1.06; Potassium 4.2; Sodium 137  Recent Lipid Panel    Component Value Date/Time   CHOL 153 10/09/2020 1058   TRIG 228 (H) 10/09/2020 1058   HDL 41 10/09/2020 1058   CHOLHDL 3.7 10/09/2020 1058   CHOLHDL 6.9 01/30/2018 0347   VLDL 64 (H) 01/30/2018 0347   LDLCALC 74 10/09/2020 1058      Physical Exam:    VS:  BP 134/70   Pulse 66   Ht 5\' 10"  (1.778 m)   Wt 259 lb 9.6 oz (117.8 kg)   SpO2 95%   BMI 37.25 kg/m     Wt Readings from Last 3 Encounters:  03/27/21 259 lb 9.6 oz (117.8 kg)  10/09/20 255 lb 9.6 oz (115.9 kg)  06/13/20 250 lb (113.4 kg)     GEN:  Well nourished, well developed in no acute distress HEENT: Normal NECK: No JVD; No carotid bruits CARDIAC: RRR, 2/6 systolic murmur, No rubs, gallops RESPIRATORY:  Clear to auscultation without rales, wheezing or rhonchi  ABDOMEN: Soft, non-tender, non-distended MUSCULOSKELETAL:  No edema; No deformity  SKIN: Warm and dry NEUROLOGIC:  Alert and oriented x 3 PSYCHIATRIC:  Normal affect   ASSESSMENT:    1. S/P TAVR (transcatheter aortic valve replacement)   2. Essential hypertension   3. Coronary artery disease involving native coronary artery of native heart without angina pectoris   4. Mixed hyperlipidemia   5. Hyperlipidemia, unspecified hyperlipidemia type   6.  Hyperlipidemia LDL goal <70     PLAN:    In order of problems listed above:  #Severe AS s/p TAVR: Stable on TTE 11/2020 with LVEF 60-65%, well seated TAVR valve with mild paravalvular leak, mean gradient 39mmHg, DI 0.31. No anginal or HF symptoms. -Continue surveillance TTEs annually; next 11/2021 -Continue IE ppx prior to dental procedures  #Mild-to-moderate CAD without angina: No anginal symptoms. -Continue ASA 81mg  daily -Continue crestor 20mg  daily -Continue coreg 3.125mg  BID -Continue losartan 100mg  daily  #HTN: Mildly elevated at home -Increase amlodipine to 5mg  daily -Continue HCTZ 25mg  daily -Continue spironolactone 25mg  daily -Continue losartan 100mg  daily -Continue coreg 3.125mg  BID  #HLD: LDL 74 in 10/2020. Goal <70 -Continue crestor 20mg  daily; may need to increase as goal LDL <70 -Repeat lipids at next vit  -Diet and lifestyle modifications  #Mild Ascending Aorta Dilation: Measures 9mm on TTE 11/03/19, however appeared normal in 11/2020. CTA in 2020 normal. -No further monitoring needed as appeared normal on TTE in 11/2020 and CTA chest in 2020     Medication Adjustments/Labs and Tests Ordered: Current medicines are reviewed at length with the patient today.  Concerns regarding medicines are outlined above.  Orders Placed This Encounter  Procedures   EKG 12-Lead    Meds ordered this encounter  Medications   amLODipine (NORVASC) 5 MG tablet    Sig: Take 1 tablet (5 mg total) by mouth daily.    Dispense:  90 tablet    Refill:  3    DOSE INCREASE   spironolactone (ALDACTONE) 25 MG tablet    Sig: Take 1 tablet (25 mg total) by mouth daily.    Dispense:  90 tablet    Refill:  2    DOSE INCREASE   rosuvastatin (CRESTOR) 20 MG tablet    Sig: Take 1 tablet (20 mg total) by mouth daily.    Dispense:  90 tablet    Refill:  3   hydrochlorothiazide (HYDRODIURIL) 25 MG tablet    Sig: Take 1 tablet (25 mg total) by mouth daily.    Dispense:  90 tablet     Refill:  2   carvedilol (COREG) 3.125 MG tablet    Sig: Take 1 tablet (3.125 mg total) by mouth 2 (two) times daily.    Dispense:  180 tablet    Refill:  2     Patient Instructions  Medication Instructions:   INCREASE YOUR AMLODIPINE TO 5 MG BY MOUTH DAILY   *If you need a refill on your cardiac medications before your next appointment, please call your pharmacy*   Follow-Up: At [facility], you and your health needs are our priority.  As part of our continuing mission to provide you with exceptional heart care, we have created designated Provider Care Teams.  These Care Teams include your primary Cardiologist (physician) and Advanced Practice Providers (APPs -  Physician Assistants and Nurse Practitioners) who all work together to provide you with the care you need, when you need it.  We recommend signing up for the patient portal called "MyChart".  Sign up information is provided on this After Visit Summary.  MyChart is used to connect with patients for Virtual Visits (Telemedicine).  Patients are able to view lab/test results, encounter notes, upcoming appointments, etc.  Non-urgent messages can be sent to your provider as well.   To learn more about what you can do with MyChart, go to [email or web address].    Your next appointment:   6 month(s)  The format for your next appointment:   In Person  Provider:    DR. Reece Leader as a scribe for Freada Bergeron, MD.,have documented all relevant documentation on the behalf of Freada Bergeron, MD,as directed by  Freada Bergeron, MD while in the presence of Freada Bergeron, MD.  I, Freada Bergeron, MD, have reviewed all documentation for this visit. The documentation on 03/27/21 for the exam, diagnosis, procedures, and orders are all accurate and complete.   Signed, Freada Bergeron, MD  03/27/2021 12:26 PM    [place]

## 2021-05-21 ENCOUNTER — Other Ambulatory Visit: Payer: Self-pay

## 2021-05-21 MED ORDER — AMOXICILLIN 500 MG PO TABS: ORAL_TABLET | ORAL | 8 refills | Status: DC

## 2021-05-23 DIAGNOSIS — I2510 Atherosclerotic heart disease of native coronary artery without angina pectoris: Secondary | ICD-10-CM | POA: Diagnosis not present

## 2021-05-23 DIAGNOSIS — E119 Type 2 diabetes mellitus without complications: Secondary | ICD-10-CM | POA: Diagnosis not present

## 2021-05-23 DIAGNOSIS — G4733 Obstructive sleep apnea (adult) (pediatric): Secondary | ICD-10-CM | POA: Diagnosis not present

## 2021-05-23 DIAGNOSIS — L02412 Cutaneous abscess of left axilla: Secondary | ICD-10-CM | POA: Diagnosis not present

## 2021-05-23 DIAGNOSIS — I10 Essential (primary) hypertension: Secondary | ICD-10-CM | POA: Diagnosis not present

## 2021-05-23 DIAGNOSIS — I69320 Aphasia following cerebral infarction: Secondary | ICD-10-CM | POA: Diagnosis not present

## 2021-05-23 DIAGNOSIS — Z952 Presence of prosthetic heart valve: Secondary | ICD-10-CM | POA: Diagnosis not present

## 2021-05-23 DIAGNOSIS — N400 Enlarged prostate without lower urinary tract symptoms: Secondary | ICD-10-CM | POA: Diagnosis not present

## 2021-05-23 DIAGNOSIS — M545 Low back pain, unspecified: Secondary | ICD-10-CM | POA: Diagnosis not present

## 2021-05-28 ENCOUNTER — Encounter: Payer: Self-pay | Admitting: Cardiology

## 2021-06-05 DIAGNOSIS — Z85828 Personal history of other malignant neoplasm of skin: Secondary | ICD-10-CM | POA: Diagnosis not present

## 2021-06-05 DIAGNOSIS — L720 Epidermal cyst: Secondary | ICD-10-CM | POA: Diagnosis not present

## 2021-06-05 DIAGNOSIS — D225 Melanocytic nevi of trunk: Secondary | ICD-10-CM | POA: Diagnosis not present

## 2021-06-05 DIAGNOSIS — L812 Freckles: Secondary | ICD-10-CM | POA: Diagnosis not present

## 2021-06-05 DIAGNOSIS — L570 Actinic keratosis: Secondary | ICD-10-CM | POA: Diagnosis not present

## 2021-06-05 DIAGNOSIS — C44712 Basal cell carcinoma of skin of right lower limb, including hip: Secondary | ICD-10-CM | POA: Diagnosis not present

## 2021-06-05 DIAGNOSIS — L821 Other seborrheic keratosis: Secondary | ICD-10-CM | POA: Diagnosis not present

## 2021-06-05 DIAGNOSIS — D485 Neoplasm of uncertain behavior of skin: Secondary | ICD-10-CM | POA: Diagnosis not present

## 2021-06-13 DIAGNOSIS — R350 Frequency of micturition: Secondary | ICD-10-CM | POA: Diagnosis not present

## 2021-06-13 DIAGNOSIS — R351 Nocturia: Secondary | ICD-10-CM | POA: Diagnosis not present

## 2021-06-13 DIAGNOSIS — R3914 Feeling of incomplete bladder emptying: Secondary | ICD-10-CM | POA: Diagnosis not present

## 2021-06-13 DIAGNOSIS — N401 Enlarged prostate with lower urinary tract symptoms: Secondary | ICD-10-CM | POA: Diagnosis not present

## 2021-06-13 DIAGNOSIS — R3915 Urgency of urination: Secondary | ICD-10-CM | POA: Diagnosis not present

## 2021-06-20 IMAGING — CT CT PELVIS W/ CM
1 series · 16 of 32 positions shown, 20 images · IV contrast (iopamidol)
Comparison: None.

CLINICAL DATA: Rectal pain, difficulty urinating, frequency, rectal
bleeding

EXAM:
CT PELVIS WITH CONTRAST
TECHNIQUE: Multidetector CT imaging of the pelvis was performed using the
standard protocol following the bolus administration of intravenous
contrast.
CONTRAST:  100mL [id number] IOPAMIDOL ([id number]) INJECTION 61%,
additional oral enteric contrast

[Series 2: routine pelvis w/cm · axial · 0.94mm/px · z∈[-421,-101]mm · 16 of 72 slices shown, 20 images]
[im 5/72  soft-tissue]
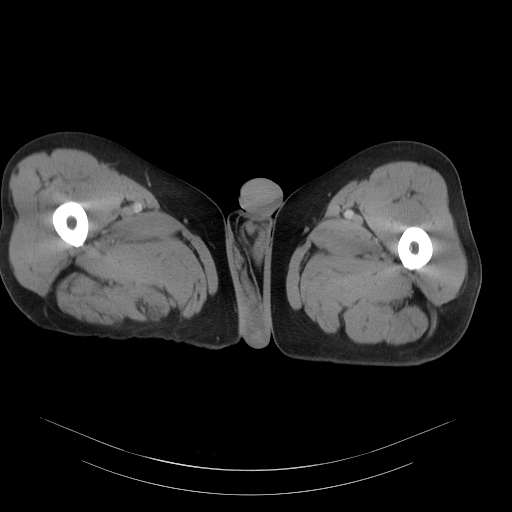
[im 5/72  bone]
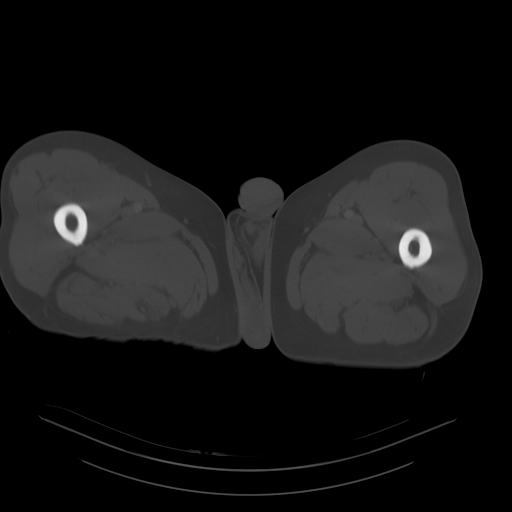
[im 10/72  soft-tissue]
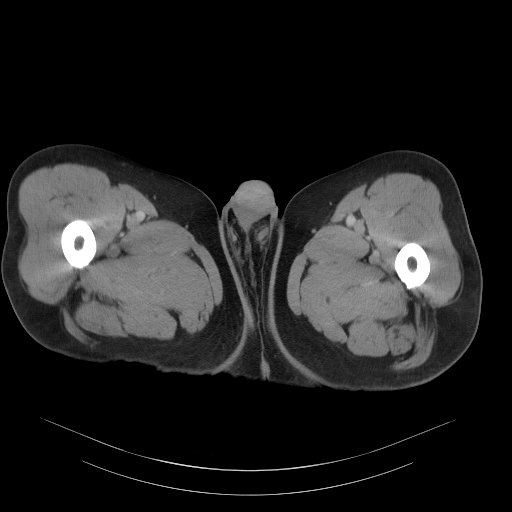
[im 14/72  soft-tissue]
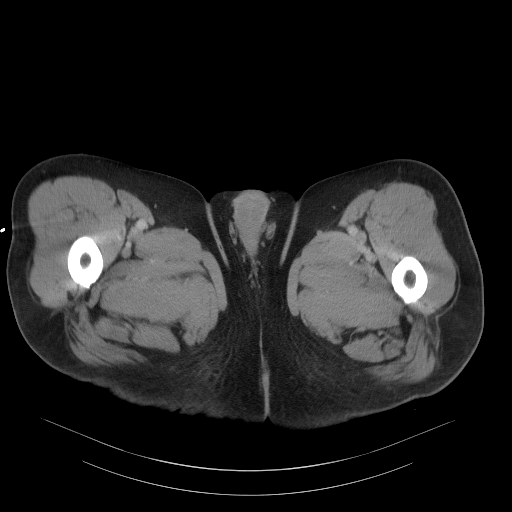
[im 19/72  soft-tissue]
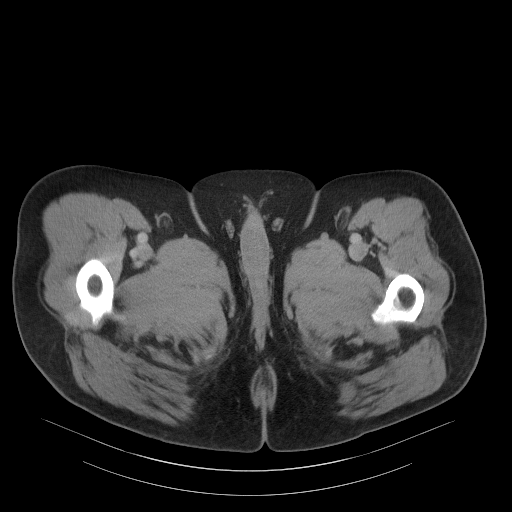
[im 23/72  soft-tissue]
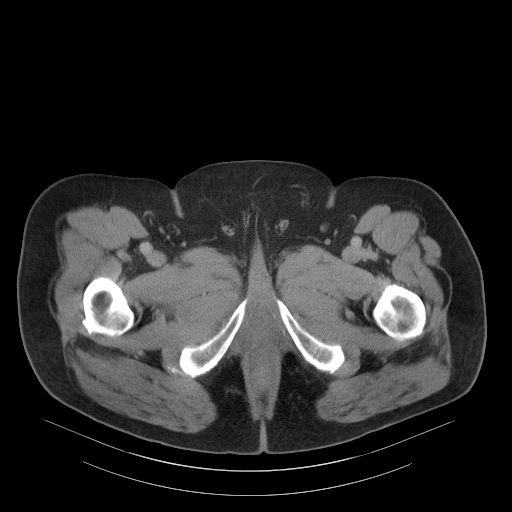
[im 28/72  soft-tissue]
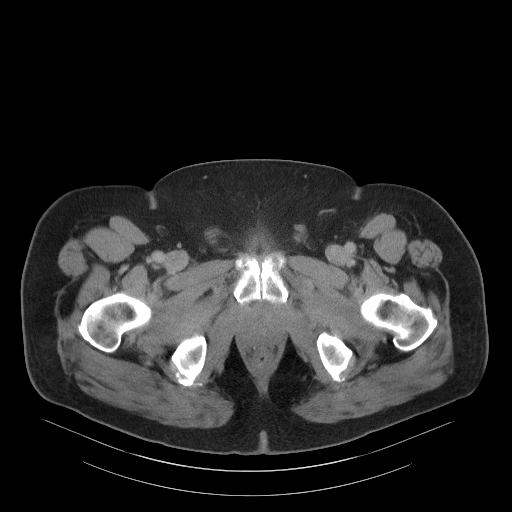
[im 33/72  soft-tissue]
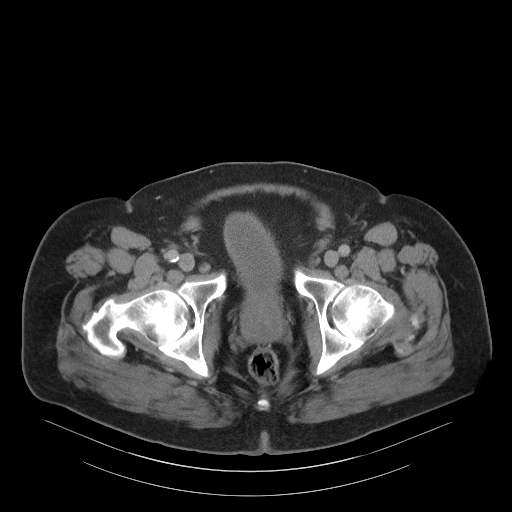
[im 39/72  soft-tissue]
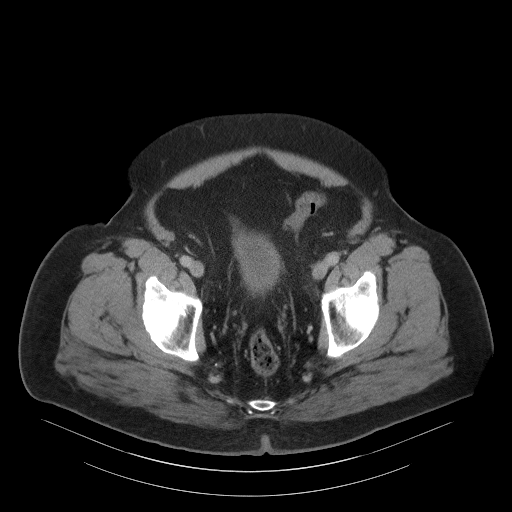
[im 44/72  soft-tissue]
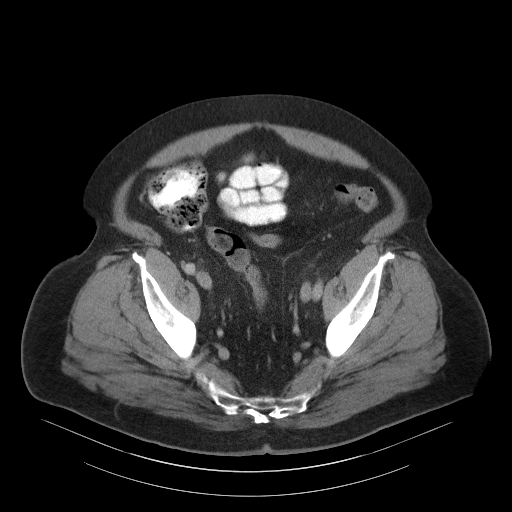
[im 44/72  bone]
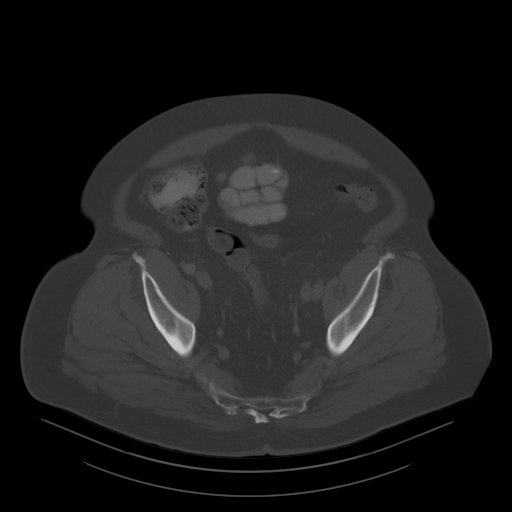
[im 49/72  soft-tissue]
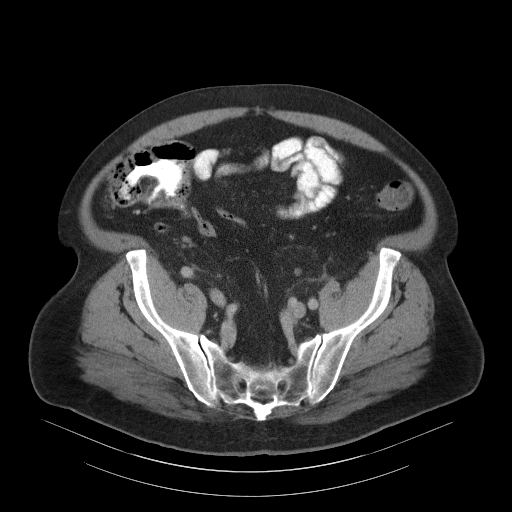
[im 53/72  soft-tissue]
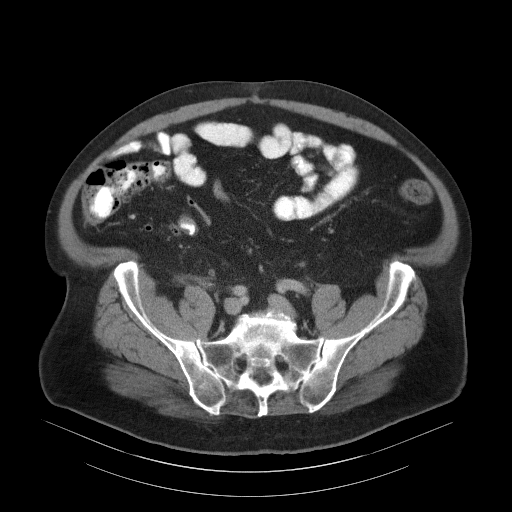
[im 58/72  soft-tissue]
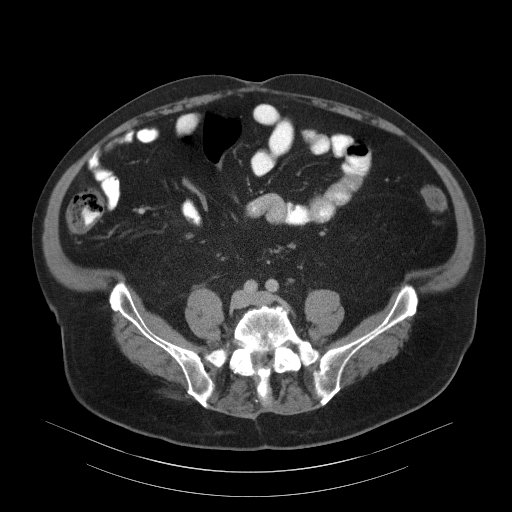
[im 62/72  soft-tissue]
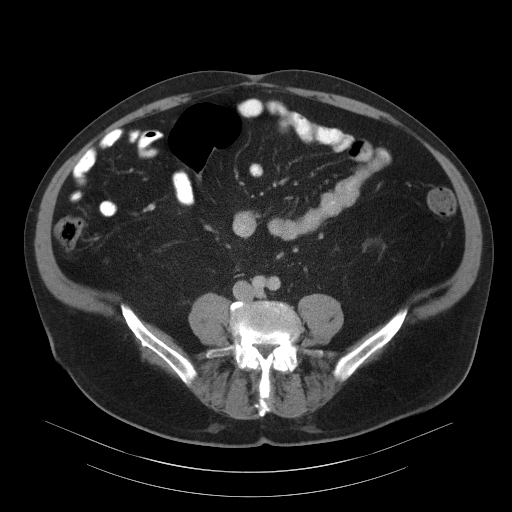
[im 62/72  lung]
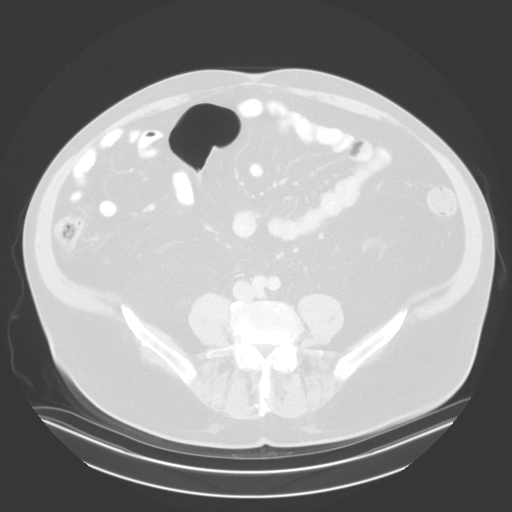
[im 65/72  lung]
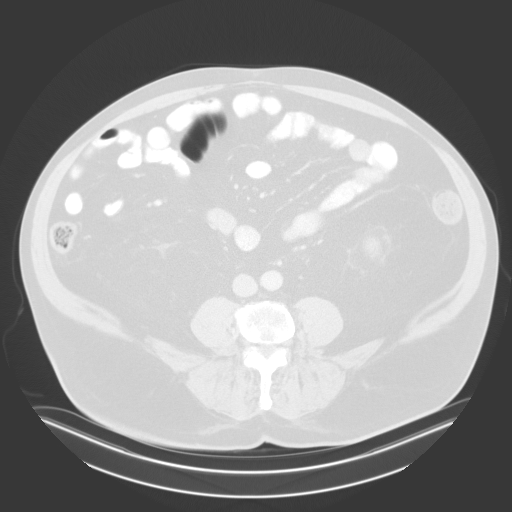
[im 67/72  soft-tissue]
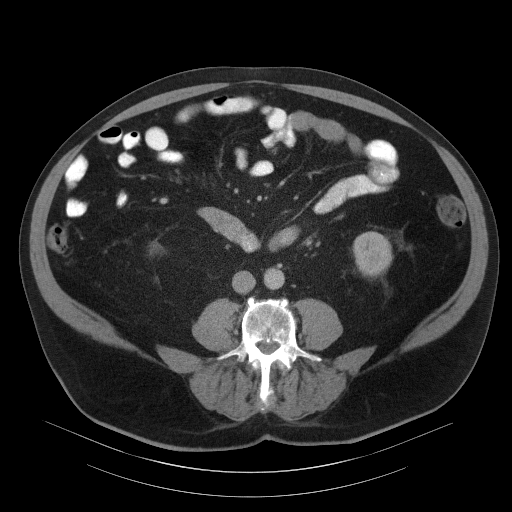
[im 67/72  lung]
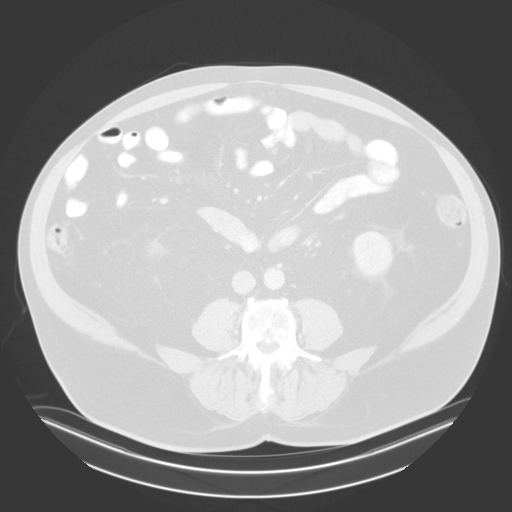
[im 69/72  lung]
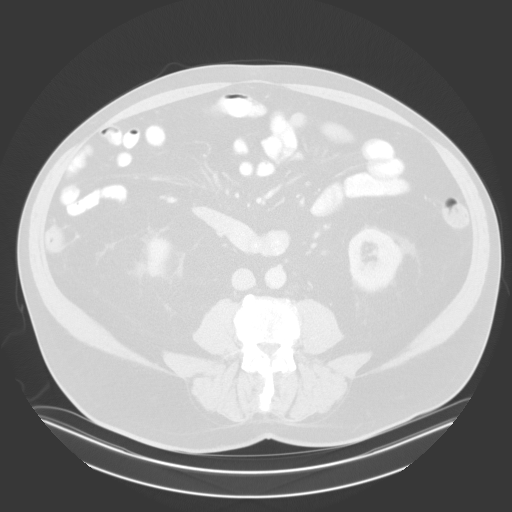

[16 of 32 positions shown; findings below may reference images not displayed]

FINDINGS: Urinary Tract:  No abnormality visualized.

Bowel: Unremarkable visualized pelvic bowel loops. Normal appendix.

Vascular/Lymphatic: No pathologically enlarged lymph nodes. No
significant vascular abnormality seen.

Reproductive:  Mild prostatomegaly.

Other:  None.

Musculoskeletal: No suspicious bone lesions identified.
IMPRESSION: 1. No CT findings of the pelvis to explain rectal pain or bleeding.
2. Mild prostatomegaly.

## 2021-06-25 DIAGNOSIS — Z0000 Encounter for general adult medical examination without abnormal findings: Secondary | ICD-10-CM | POA: Diagnosis not present

## 2021-06-25 DIAGNOSIS — Z9181 History of falling: Secondary | ICD-10-CM | POA: Diagnosis not present

## 2021-06-25 DIAGNOSIS — Z6838 Body mass index (BMI) 38.0-38.9, adult: Secondary | ICD-10-CM | POA: Diagnosis not present

## 2021-06-25 DIAGNOSIS — E669 Obesity, unspecified: Secondary | ICD-10-CM | POA: Diagnosis not present

## 2021-06-25 DIAGNOSIS — Z1331 Encounter for screening for depression: Secondary | ICD-10-CM | POA: Diagnosis not present

## 2021-06-25 DIAGNOSIS — E785 Hyperlipidemia, unspecified: Secondary | ICD-10-CM | POA: Diagnosis not present

## 2021-07-11 DIAGNOSIS — H40053 Ocular hypertension, bilateral: Secondary | ICD-10-CM | POA: Diagnosis not present

## 2021-07-17 DIAGNOSIS — H538 Other visual disturbances: Secondary | ICD-10-CM | POA: Diagnosis not present

## 2021-08-15 IMAGING — CR DG ABDOMEN 1V
2 series · 2 of 2 positions shown · non-contrast
Comparison: None.

CLINICAL DATA: Rectal pain for 2 years.

EXAM:
ABDOMEN - 1 VIEW

[t abdomen supine (1 of 2)]
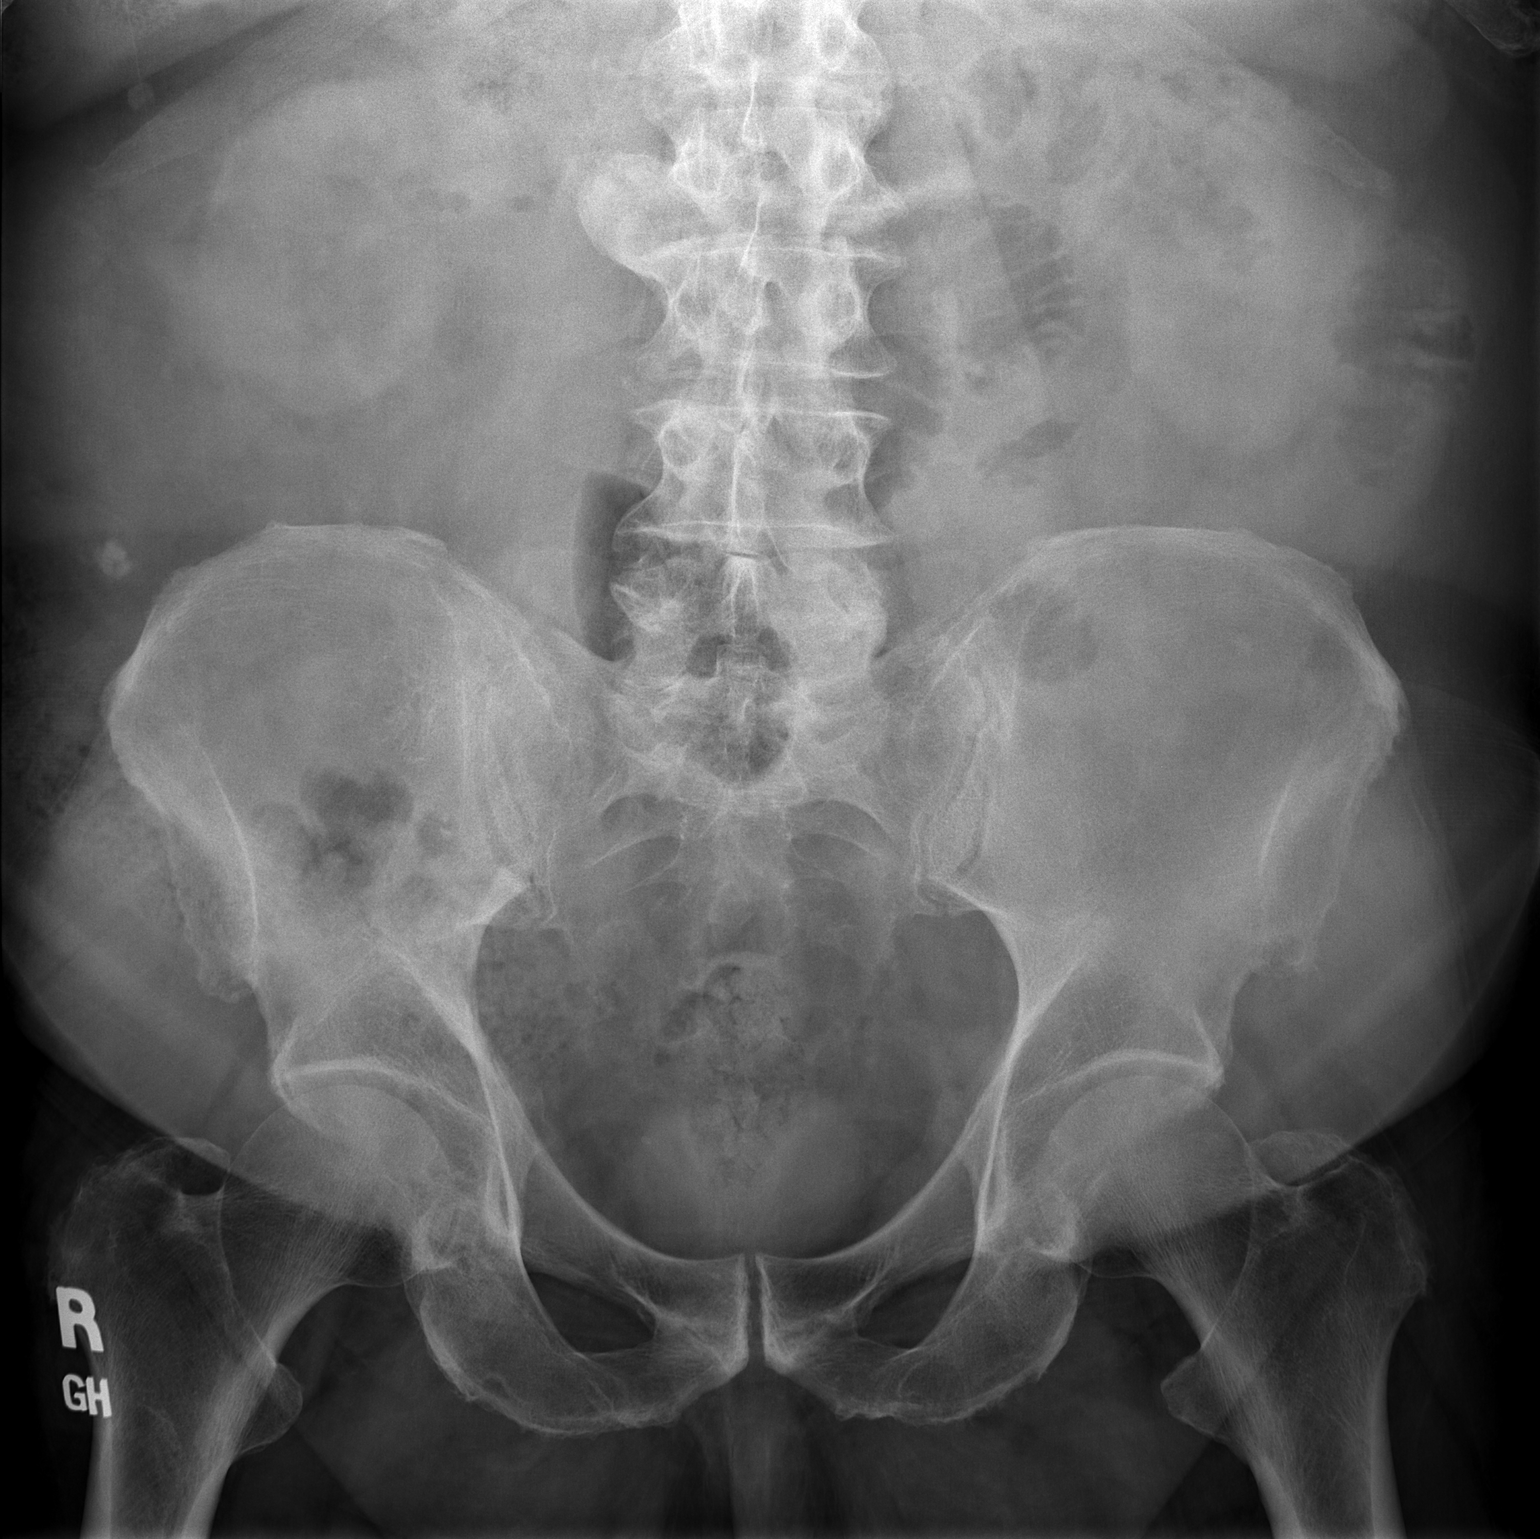

[t abdomen supine (2 of 2)]
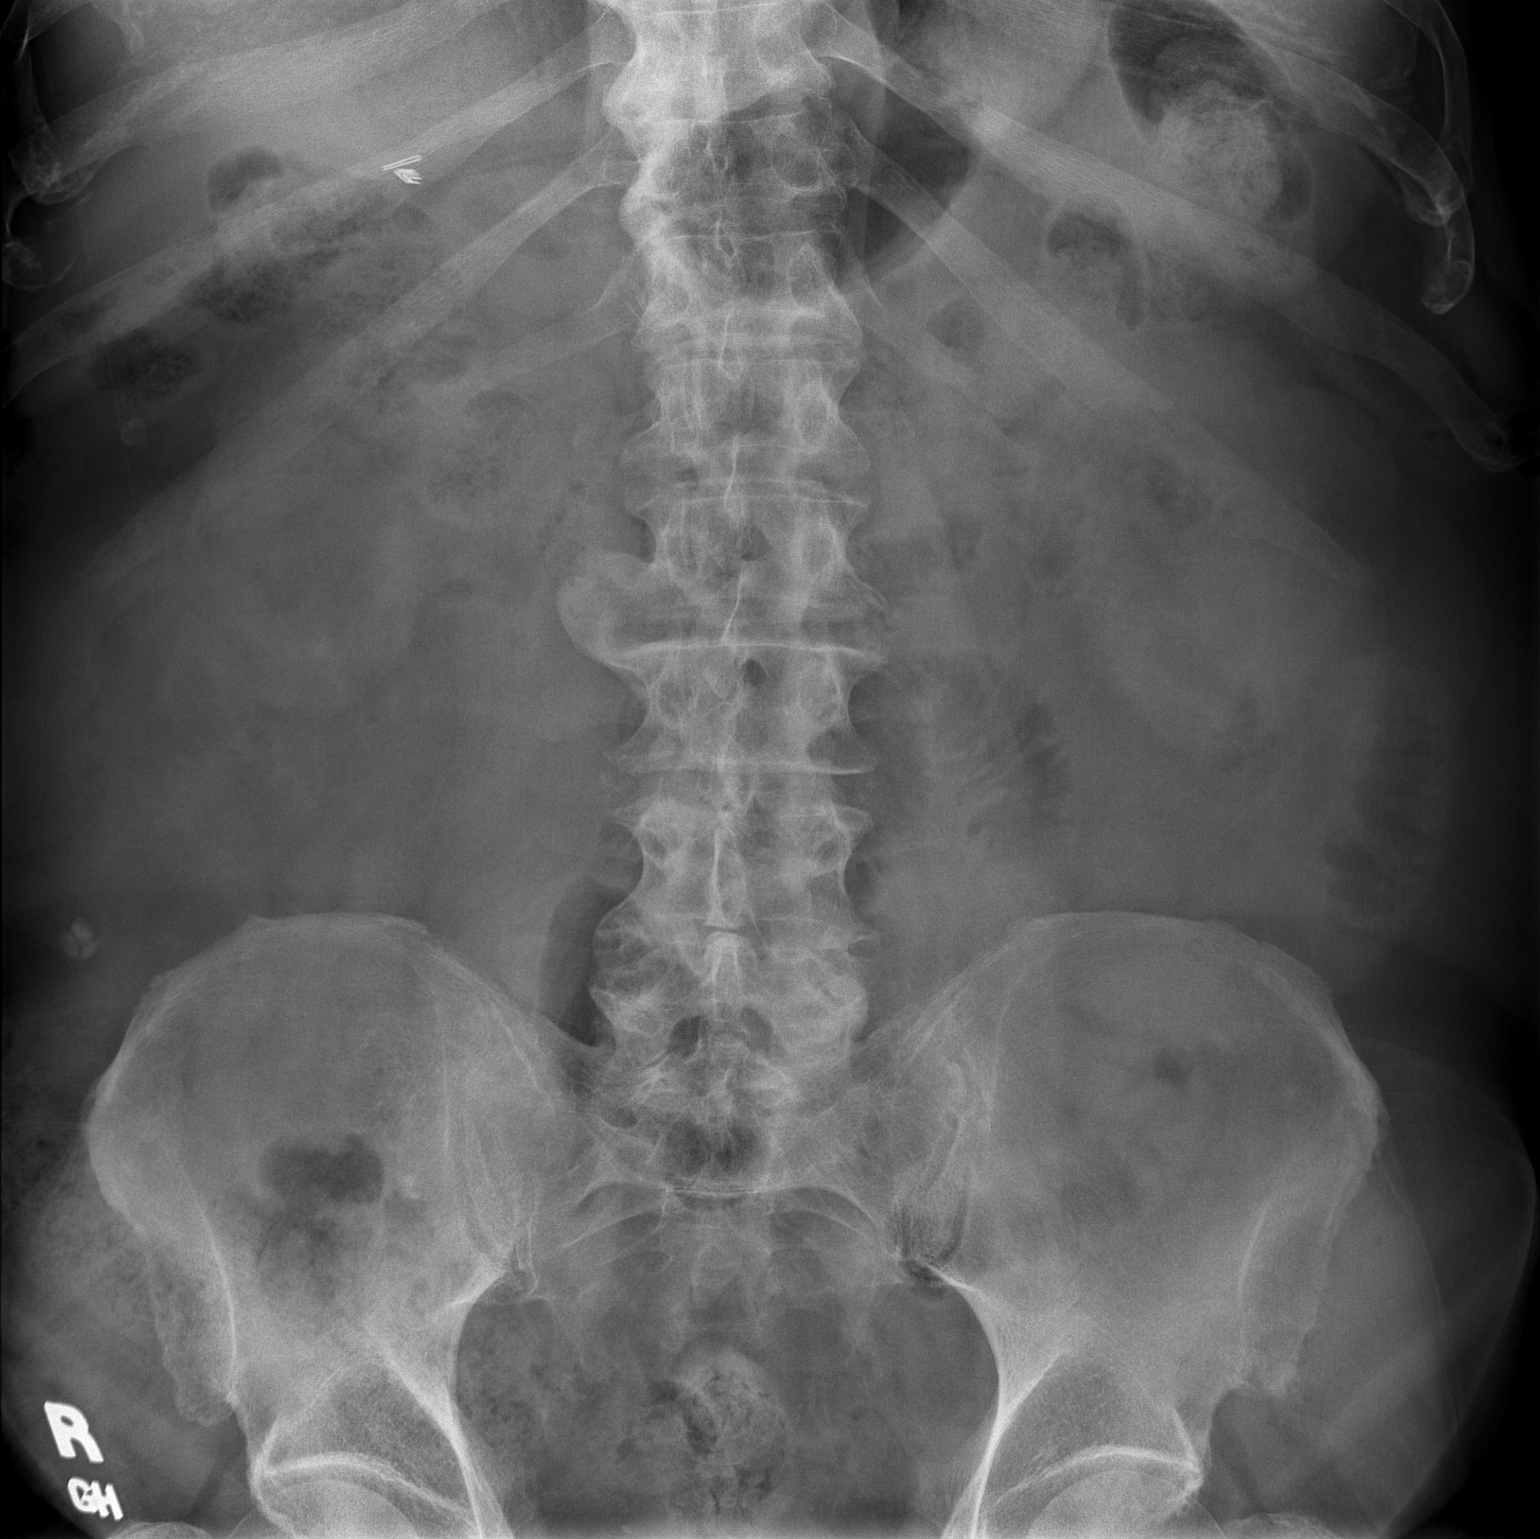

[2 of 2 positions shown; findings below may reference images not displayed]

FINDINGS: No bowel dilatation to suggest obstruction. Single prominent
air-filled loop of small bowel in the central abdomen is nondilated.
Small volume of colonic stool. Small volume of stool in the rectum.
Surgical clips in the right upper quadrant from prior
cholecystectomy. Calcifications projecting over the right abdomen
superolateral to the right iliac bone may be gluteal granulomas.
These appear lateral to the renal shadow. Lumbar spondylosis without
acute osseous abnormality
IMPRESSION: Small volume of colonic stool. Small volume of stool in the rectum.
No radiographic explanation for rectal pain.

## 2021-08-27 DIAGNOSIS — Z139 Encounter for screening, unspecified: Secondary | ICD-10-CM | POA: Diagnosis not present

## 2021-08-27 DIAGNOSIS — I69320 Aphasia following cerebral infarction: Secondary | ICD-10-CM | POA: Diagnosis not present

## 2021-08-27 DIAGNOSIS — G4733 Obstructive sleep apnea (adult) (pediatric): Secondary | ICD-10-CM | POA: Diagnosis not present

## 2021-08-27 DIAGNOSIS — M545 Low back pain, unspecified: Secondary | ICD-10-CM | POA: Diagnosis not present

## 2021-08-27 DIAGNOSIS — I2510 Atherosclerotic heart disease of native coronary artery without angina pectoris: Secondary | ICD-10-CM | POA: Diagnosis not present

## 2021-08-27 DIAGNOSIS — Z125 Encounter for screening for malignant neoplasm of prostate: Secondary | ICD-10-CM | POA: Diagnosis not present

## 2021-08-27 DIAGNOSIS — I10 Essential (primary) hypertension: Secondary | ICD-10-CM | POA: Diagnosis not present

## 2021-08-27 DIAGNOSIS — N400 Enlarged prostate without lower urinary tract symptoms: Secondary | ICD-10-CM | POA: Diagnosis not present

## 2021-08-27 DIAGNOSIS — K6289 Other specified diseases of anus and rectum: Secondary | ICD-10-CM | POA: Diagnosis not present

## 2021-08-27 DIAGNOSIS — E119 Type 2 diabetes mellitus without complications: Secondary | ICD-10-CM | POA: Diagnosis not present

## 2021-10-11 ENCOUNTER — Encounter: Payer: Self-pay | Admitting: Cardiology

## 2021-10-15 ENCOUNTER — Encounter: Payer: Self-pay | Admitting: Cardiology

## 2021-10-25 DIAGNOSIS — D485 Neoplasm of uncertain behavior of skin: Secondary | ICD-10-CM | POA: Diagnosis not present

## 2021-10-25 DIAGNOSIS — C44629 Squamous cell carcinoma of skin of left upper limb, including shoulder: Secondary | ICD-10-CM | POA: Diagnosis not present

## 2021-10-25 DIAGNOSIS — Z85828 Personal history of other malignant neoplasm of skin: Secondary | ICD-10-CM | POA: Diagnosis not present

## 2021-10-28 NOTE — Progress Notes (Signed)
Cardiology Office Note:    Date:  11/09/2021   ID:  [name], DOB 17-May-1945, MRN [id number]  PCP:  Eugene Bender, PA-C   [facility] Providers Cardiologist:  Eugene Bergeron, MD Structural Heart:  Eugene Chandler, MD {  Referring MD: Eugene Bender, PA-C     History of Present Illness:    [name] is a 76 y.o. male with a hx of AS s/p 72m S3 TAVR 10/2018, CAD with mild to moderate disease on cath in 10/2018, prior CVA, HTN, HLD, OSA on CPAP, and history of seizures who was previously followed by Dr. NMeda Anderson now presents to clinic for follow-up.  Was seen in  10/09/20 where he was doing well without anginal or HF symptoms. TTE 11/08/20 with normal LVEF 60-65%, mild paravalvular leak (stable since 2020), MG 167mg, DI 0.31 ascending aortic dilation of 4260mHe had no HF symptoms.   Was last seen in clinic on 03/2021 where he was doing well from CV standpoint. We increased his amlodipine to '5mg'$  daily.  Today, the patient reports that when he bends over and stands back up, he feels "off balance" and "pressure in his head." This has worsened since he has been outside in the summer and working outside. Also can have similar symptoms when he looks up. Denies dizziness, lightheadedness or presyncope. No chest pain, SOB, orthopnea, or PND. Notably, he is taking tamsulosin BID in addition to HCTZ and spironolactone.  Blood pressure has been 120-130/50-60s. Orthostatics negative today.   Past Medical History:  Diagnosis Date   Aortic stenosis, severe    Arthritis    "hands" (02/04/2018)   Basal cell carcinoma    "MOHS surgery on sides of my nose" (02/04/2018)   Chronic lower back pain    [place]   [place]   Diarrhea    Difficulty urinating    Heart murmur    no problems   High cholesterol    Hypertension    OSA on CPAP    "just got CPAP today" (02/04/2018)   Rectal bleeding     Rectal pain    S/P TAVR (transcatheter aortic valve replacement) 11/03/2018   26 mm Edwards Sapien 3 transcatheter heart valve placed via percutaneous right transfemoral approach    [place]  "we were told that MRI showed previous stroke we were not aware that he had" (02/04/2018)    Past Surgical History:  Procedure Laterality Date   ANTERIOR CERVICAL DECOMP/DISCECTOMY FUSION  2009   BACK SURGERY  1980's   COLONOSCOPY     x 2   EXCISIONAL HEMORRHOIDECTOMY  1989 -2011 X 2 or 3   LAPAROSCOPIC CHOLECYSTECTOMY  2005   [place]UMBAR [place]   MOHS SURGERY Bilateral 2006   "sides of my nose" x 2   RIGHT/LEFT HEART CATH AND CORONARY ANGIOGRAPHY N/A 10/21/2018   Procedure: RIGHT/LEFT HEART CATH AND CORONARY ANGIOGRAPHY;  Surgeon: McABurnell BlanksD;  Location: [facility] [place];  Service: Cardiovascular;  Laterality: N/A;   TEE WITHOUT CARDIOVERSION N/A 02/03/2018   Procedure: TRANSESOPHAGEAL ECHOCARDIOGRAM (TEE);  Surgeon: CreLelon PerlaD;  [facility];  Service: Cardiovascular;  Laterality: N/A;   TEE WITHOUT CARDIOVERSION N/A 11/03/2018   Procedure: TRANSESOPHAGEAL ECHOCARDIOGRAM (TEE);  Surgeon: McABurnell BlanksD;  Location: [facility] [place] [facility];  Service: Open Heart Surgery;  Laterality: N/A;   TRANSCATHETER AORTIC VALVE REPLACEMENT, TRANSFEMORAL N/A  11/03/2018   Procedure: TRANSCATHETER AORTIC VALVE REPLACEMENT, TRANSFEMORAL;  Surgeon: Burnell Blanks, MD;  Location: [place] [facility];  Service: Open Heart Surgery;  Laterality: N/A;   WISDOM TOOTH EXTRACTION      Current Medications: Current Meds  Medication Sig   acetaminophen (TYLENOL) 650 MG CR tablet Take 650 mg by mouth every 8 (eight) hours as needed.   amLODipine (NORVASC) 5 MG tablet Take 1 tablet (5 mg total) by mouth daily.   amoxicillin (AMOXIL) 500 MG tablet Take 4 tablets (2,000 mg total) by mouth one hour prior to all dental visits.   aspirin EC 81 MG EC tablet Take  1 tablet (81 mg total) by mouth daily.   carvedilol (COREG) 3.125 MG tablet Take 1 tablet (3.125 mg total) by mouth 2 (two) times daily.   doxycycline (VIBRAMYCIN) 100 MG capsule Take 100 mg by mouth 2 (two) times daily.   dutasteride (AVODART) 0.5 MG capsule Take 0.5 mg by mouth daily.   gabapentin (NEURONTIN) 100 MG capsule Take 100 mg by mouth at bedtime.   losartan (COZAAR) 100 MG tablet TAKE 1 TABLET BY MOUTH EVERY DAY   rosuvastatin (CRESTOR) 20 MG tablet Take 1 tablet (20 mg total) by mouth daily.   spironolactone (ALDACTONE) 25 MG tablet Take 1 tablet (25 mg total) by mouth daily.   tamsulosin (FLOMAX) 0.4 MG CAPS capsule Take 0.4 mg by mouth daily.   [DISCONTINUED] hydrochlorothiazide (HYDRODIURIL) 25 MG tablet Take 1 tablet (25 mg total) by mouth daily.     Allergies:   Atorvastatin   Social History   Socioeconomic History   Marital status: Married    Spouse name: Not on file   Number of children: Not on file   Years of education: Not on file   Highest education level: Not on file  Occupational History   Occupation: retired  Tobacco Use   Smoking status: Never   Smokeless tobacco: Never  Vaping Use   Vaping Use: Never used  Substance and Sexual Activity   Alcohol use: Not Currently    Alcohol/week: 0.0 standard drinks of alcohol   Drug use: Not Currently   Sexual activity: Not Currently  Other Topics Concern   Not on file  Social History Narrative   Lives with his wife in [place], [place]   Social Determinants of Health   Financial Resource Strain: Not on file  Food Insecurity: Not on file  Transportation Needs: Not on file  Physical Activity: Not on file  Stress: Not on file  Social Connections: Not on file     Family History: The patient's family history includes Cancer in his mother; Heart disease in his father. There is no history of Stroke.  ROS:   Please see the history of present illness.    Review of Systems  Constitutional:  Negative for chills and  fever.  HENT:  Negative for sore throat.   Eyes:  Negative for blurred vision.  Respiratory:  Negative for shortness of breath.   Cardiovascular:  Negative for chest pain, palpitations, orthopnea, claudication, leg swelling and PND.  Gastrointestinal:  Negative for nausea and vomiting.  Genitourinary:  Negative for flank pain and hematuria.  Musculoskeletal:  Positive for back pain. Negative for falls.  Skin:  Negative for itching and rash.  Neurological:  Positive for dizziness and headaches. Negative for loss of consciousness.  Endo/Heme/Allergies:  Negative for polydipsia. Does not bruise/bleed easily.  Psychiatric/Behavioral:  Negative for substance abuse.     EKGs/Labs/Other Studies  Reviewed:    The following studies were reviewed today: TTE November 17, 2020: IMPRESSIONS   1. 26 mm S3 (11/03/2018). V max 2.8 m/s, MG 16 mmHG, EOA 1.54 cm2, DI  0.31. There is mild paravalvular leak in the 11-1 o'clock position. PVL  has not changed from prior. The aortic valve has been repaired/replaced.  Aortic valve regurgitation is mild.  There is a 26 mm Sapien prosthetic (TAVR) valve present in the aortic  position. Procedure Date: 11/03/2018.   2. Left ventricular ejection fraction, by estimation, is 60 to 65%. The  left ventricle has normal function. The left ventricle has no regional  wall motion abnormalities. There is moderate concentric left ventricular  hypertrophy. Left ventricular  diastolic parameters are consistent with Grade I diastolic dysfunction  (impaired relaxation).   3. Right ventricular systolic function is normal. The right ventricular  size is normal. Tricuspid regurgitation signal is inadequate for assessing  PA pressure.   4. The mitral valve is grossly normal. Trivial mitral valve  regurgitation. No evidence of mitral stenosis.   5. The inferior vena cava is normal in size with greater than 50%  respiratory variability, suggesting right atrial pressure of 3 mmHg.    Comparison(s): No significant change from prior study. AoV prosthesis  remains unchanged in gradient and PVL.   Echocardiogram 11/03/19 IMPRESSIONS   1. Left ventricular ejection fraction, by estimation, is 60 to 65%. The  left ventricle has normal function. The left ventricle has no regional  wall motion abnormalities. There is mild concentric left ventricular  hypertrophy. Left ventricular diastolic  parameters are consistent with Grade I diastolic dysfunction (impaired  relaxation). Elevated left atrial pressure.   2. Right ventricular systolic function is normal. The right ventricular  size is normal.   3. Left atrial size was mildly dilated.   4. The mitral valve is normal in structure. Trivial mitral valve  regurgitation.   5. The aortic valve has been repaired/replaced. Aortic valve  regurgitation is mild to moderate. There is a 26 mm Edwards Sapien  prosthetic (TAVR) valve present in the aortic position. Procedure Date:  11/03/18. Echo findings are consistent with  perivalvular leak of the aortic prosthesis. Aortic valve mean gradient  measures 16.0 mmHg. Aortic valve Vmax measures 2.87 m/s.   6. Aortic dilatation noted. There is mild dilatation of the ascending  aorta measuring 42 mm.    Cardiac catheterization 10/21/2018 LAD dist 20 LCx ost 20, mid 20 RCA mid 40   07/08/19: CTA NECK FINDINGS   Aortic arch: Great vessel origins are patent.   Right carotid system: Patent.  No measurable stenosis.   Left carotid system: Patent. There is mild noncalcified plaque at the ICA origin causing minimal stenosis.   Vertebral arteries: Patent and codominant.   Skeleton: Postoperative changes of anterior fusion at C4-C6. Cervical spine degenerative changes.   Other neck: No mass or adenopathy.   Upper chest: No apical lung mass.   Review of the MIP images confirms the above findings   CTA HEAD FINDINGS   Anterior circulation: Intracranial internal carotid arteries  are patent with mild calcified plaque. Anterior and middle cerebral arteries are patent.   Posterior circulation: Intracranial vertebral arteries, basilar artery, and posterior cerebral arteries are patent   Venous sinuses: As permitted by contrast timing, patent.   Review of the MIP images confirms the above findings   IMPRESSION: No acute intracranial abnormality. Several chronic infarcts. 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   No large vessel  occlusion, hemodynamically significant stenosis, or evidence of dissection.  EKG:   11/09/21: NSR with HR 68, LAFB  Recent Labs: No results found for requested labs within last 365 days.  Recent Lipid Panel    Component Value Date/Time   CHOL 153 10/09/2020 1058   TRIG 228 (H) 10/09/2020 1058   HDL 41 10/09/2020 1058   CHOLHDL 3.7 10/09/2020 1058   CHOLHDL 6.9 01/30/2018 0347   VLDL 64 (H) 01/30/2018 0347   LDLCALC 74 10/09/2020 1058      Physical Exam:    VS:  BP (!) 115/58   Pulse 68   Ht '5\' 10"'$  (1.778 m)   Wt 252 lb 3.2 oz (114.4 kg)   SpO2 96%   BMI 36.19 kg/m     Wt Readings from Last 3 Encounters:  11/09/21 252 lb 3.2 oz (114.4 kg)  03/27/21 259 lb 9.6 oz (117.8 kg)  10/09/20 255 lb 9.6 oz (115.9 kg)     GEN:  Well nourished, well developed in no acute distress HEENT: Normal NECK: No JVD; No carotid bruits CARDIAC: RRR, 2/6 systolic murmur, No rubs, gallops RESPIRATORY:  Clear to auscultation without rales, wheezing or rhonchi  ABDOMEN: Soft, non-tender, non-distended MUSCULOSKELETAL:  No edema; No deformity  SKIN: Warm and dry NEUROLOGIC:  Alert and oriented x 3 PSYCHIATRIC:  Normal affect   ASSESSMENT:    1. S/P TAVR (transcatheter aortic valve replacement)   2. Essential hypertension   3. Coronary artery disease involving native coronary artery of native heart without angina pectoris   4. Mixed hyperlipidemia   5. Hyperlipidemia, unspecified hyperlipidemia type   6.  Hyperlipidemia LDL goal <70   7. Orthostasis   8. Severe aortic stenosis     PLAN:    In order of problems listed above:  #Severe AS s/p TAVR: Stable on TTE 11/2020 with LVEF 60-65%, well seated TAVR valve with mild paravalvular leak, mean gradient 66mHg, DI 0.31. No anginal or HF symptoms. Pulse pressure is wide today but no diastolic murmur heard. Will check TTE for monitroing. -Check TTE -Continue IE ppx prior to dental procedures  #Mild-to-moderate CAD without angina: No anginal symptoms. -Continue ASA '81mg'$  daily -Continue crestor '20mg'$  daily -Continue coreg 3.'125mg'$  BID -Continue losartan '100mg'$  daily  #HTN: #Concern for Orthostasis: Now with possible orthostatic symptoms. Orthostatics in the office negative today but having positional symptoms of head pressure and feeling off balance. Will adjust medications as below. -Decrease tamsulosin to daily dosing -Continue amlodipine '5mg'$  daily -Decrease HCTZ to 12.'5mg'$  daily -Continue spironolactone '25mg'$  daily -Continue losartan '100mg'$  daily -Continue coreg 3.'125mg'$  BID  #HLD: LDL 74 in 10/2020. Goal <70 -Continue crestor '20mg'$  daily -Diet and lifestyle modifications  #Mild Ascending Aorta Dilation: Measures 474mon TTE 11/03/19, however appeared normal in 11/2020. CTA in 2020 normal. -No further monitoring needed as appeared normal on TTE in 11/2020 and CTA chest in 2020     Medication Adjustments/Labs and Tests Ordered: Current medicines are reviewed at length with the patient today.  Concerns regarding medicines are outlined above.  Orders Placed This Encounter  Procedures   EKG 12-Lead   ECHOCARDIOGRAM COMPLETE    Meds ordered this encounter  Medications   hydrochlorothiazide (HYDRODIURIL) 25 MG tablet    Sig: Take 0.5 tablets (12.5 mg total) by mouth daily.    Dispense:  45 tablet    Refill:  1    Dose decrease to 12.5 mg daily     Patient Instructions  Medication Instructions:  Please decrease your  Tamsulosin  to once daily. Take Hydrochlorothiazide 12.5 mg a day. Continue all other medications as listed.  *If you need a refill on your cardiac medications before your next appointment, please call your pharmacy*   Testing/Procedures: Your physician has requested that you have an echocardiogram. Echocardiography is a painless test that uses sound waves to create images of your heart. It provides your doctor with information about the size and shape of your heart and how well your heart's chambers and valves are working. This procedure takes approximately one hour. There are no restrictions for this procedure.   Follow-Up: At [facility], you and your health needs are our priority.  As part of our continuing mission to provide you with exceptional heart care, we have created designated Provider Care Teams.  These Care Teams include your primary Cardiologist (physician) and Advanced Practice Providers (APPs -  Physician Assistants and Nurse Practitioners) who all work together to provide you with the care you need, when you need it.  We recommend signing up for the patient portal called "MyChart".  Sign up information is provided on this After Visit Summary.  MyChart is used to connect with patients for Virtual Visits (Telemedicine).  Patients are able to view lab/test results, encounter notes, upcoming appointments, etc.  Non-urgent messages can be sent to your provider as well.   To learn more about what you can do with MyChart, go to [email or web address].    Your next appointment:   6 month(s)  The format for your next appointment:   In Person  Provider:   Dr Gwyndolyn Kaufman  Important Information About Sugar        Signed, Eugene Bergeron, MD  11/09/2021 11:46 AM    [place]

## 2021-10-31 DIAGNOSIS — L720 Epidermal cyst: Secondary | ICD-10-CM | POA: Diagnosis not present

## 2021-10-31 DIAGNOSIS — Z85828 Personal history of other malignant neoplasm of skin: Secondary | ICD-10-CM | POA: Diagnosis not present

## 2021-11-09 ENCOUNTER — Ambulatory Visit: Payer: Medicare PPO | Admitting: Cardiology

## 2021-11-09 ENCOUNTER — Encounter: Payer: Self-pay | Admitting: Cardiology

## 2021-11-09 VITALS — BP 115/58 | HR 68 | Ht 70.0 in | Wt 252.2 lb

## 2021-11-09 DIAGNOSIS — E785 Hyperlipidemia, unspecified: Secondary | ICD-10-CM | POA: Diagnosis not present

## 2021-11-09 DIAGNOSIS — E782 Mixed hyperlipidemia: Secondary | ICD-10-CM

## 2021-11-09 DIAGNOSIS — I350 Nonrheumatic aortic (valve) stenosis: Secondary | ICD-10-CM | POA: Diagnosis not present

## 2021-11-09 DIAGNOSIS — Z952 Presence of prosthetic heart valve: Secondary | ICD-10-CM | POA: Diagnosis not present

## 2021-11-09 DIAGNOSIS — I10 Essential (primary) hypertension: Secondary | ICD-10-CM | POA: Diagnosis not present

## 2021-11-09 DIAGNOSIS — I2510 Atherosclerotic heart disease of native coronary artery without angina pectoris: Secondary | ICD-10-CM

## 2021-11-09 DIAGNOSIS — I951 Orthostatic hypotension: Secondary | ICD-10-CM

## 2021-11-09 MED ORDER — HYDROCHLOROTHIAZIDE 25 MG PO TABS: 12.5000 mg | ORAL_TABLET | Freq: Every day | ORAL | 1 refills | Status: DC

## 2021-11-09 NOTE — Patient Instructions (Addendum)
Medication Instructions:  Please decrease your Tamsulosin to once daily. Take Hydrochlorothiazide 12.5 mg a day. Continue all other medications as listed.  *If you need a refill on your cardiac medications before your next appointment, please call your pharmacy*   Testing/Procedures: Your physician has requested that you have an echocardiogram. Echocardiography is a painless test that uses sound waves to create images of your heart. It provides your doctor with information about the size and shape of your heart and how well your heart's chambers and valves are working. This procedure takes approximately one hour. There are no restrictions for this procedure.   Follow-Up: At [facility], you and your health needs are our priority.  As part of our continuing mission to provide you with exceptional heart care, we have created designated Provider Care Teams.  These Care Teams include your primary Cardiologist (physician) and Advanced Practice Providers (APPs -  Physician Assistants and Nurse Practitioners) who all work together to provide you with the care you need, when you need it.  We recommend signing up for the patient portal called "MyChart".  Sign up information is provided on this After Visit Summary.  MyChart is used to connect with patients for Virtual Visits (Telemedicine).  Patients are able to view lab/test results, encounter notes, upcoming appointments, etc.  Non-urgent messages can be sent to your provider as well.   To learn more about what you can do with MyChart, go to [email or web address].    Your next appointment:   6 month(s)  The format for your next appointment:   In Person  Provider:   Dr Gwyndolyn Kaufman  Important Information About Sugar

## 2021-11-20 ENCOUNTER — Other Ambulatory Visit: Payer: Self-pay | Admitting: Physician Assistant

## 2021-11-26 DIAGNOSIS — I69320 Aphasia following cerebral infarction: Secondary | ICD-10-CM | POA: Diagnosis not present

## 2021-11-26 DIAGNOSIS — M545 Low back pain, unspecified: Secondary | ICD-10-CM | POA: Diagnosis not present

## 2021-11-26 DIAGNOSIS — K6289 Other specified diseases of anus and rectum: Secondary | ICD-10-CM | POA: Diagnosis not present

## 2021-11-26 DIAGNOSIS — I2510 Atherosclerotic heart disease of native coronary artery without angina pectoris: Secondary | ICD-10-CM | POA: Diagnosis not present

## 2021-11-26 DIAGNOSIS — Z952 Presence of prosthetic heart valve: Secondary | ICD-10-CM | POA: Diagnosis not present

## 2021-11-26 DIAGNOSIS — I10 Essential (primary) hypertension: Secondary | ICD-10-CM | POA: Diagnosis not present

## 2021-11-26 DIAGNOSIS — G4733 Obstructive sleep apnea (adult) (pediatric): Secondary | ICD-10-CM | POA: Diagnosis not present

## 2021-11-26 DIAGNOSIS — E119 Type 2 diabetes mellitus without complications: Secondary | ICD-10-CM | POA: Diagnosis not present

## 2021-11-26 DIAGNOSIS — N400 Enlarged prostate without lower urinary tract symptoms: Secondary | ICD-10-CM | POA: Diagnosis not present

## 2021-11-29 ENCOUNTER — Ambulatory Visit (HOSPITAL_COMMUNITY): Payer: Medicare PPO | Attending: Cardiology

## 2021-11-29 DIAGNOSIS — Z952 Presence of prosthetic heart valve: Secondary | ICD-10-CM | POA: Diagnosis not present

## 2021-11-29 DIAGNOSIS — I2510 Atherosclerotic heart disease of native coronary artery without angina pectoris: Secondary | ICD-10-CM | POA: Diagnosis not present

## 2021-11-29 LAB — ECHOCARDIOGRAM COMPLETE
AV Mean grad: 16 mmHg
AV Peak grad: 28.8 mmHg
Ao pk vel: 2.68 m/s
Area-P 1/2: 3.47 cm2
P 1/2 time: 403 msec
S' Lateral: 2.7 cm

## 2021-11-30 ENCOUNTER — Telehealth: Payer: Self-pay | Admitting: *Deleted

## 2021-11-30 DIAGNOSIS — Z952 Presence of prosthetic heart valve: Secondary | ICD-10-CM

## 2021-11-30 DIAGNOSIS — I77819 Aortic ectasia, unspecified site: Secondary | ICD-10-CM

## 2021-11-30 NOTE — Telephone Encounter (Signed)
-----   Message from Freada Bergeron, MD sent at 11/29/2021  7:33 PM EDT ----- His echo shows normal pumping function. The aortic valve is functioning normally and is unchanged from prior echoes. His aorta is mildly dilated at 33m which we will continue to monitor with yearly echoes.

## 2021-11-30 NOTE — Telephone Encounter (Signed)
The patient has been notified of the result and verbalized understanding.  All questions (if any) were answered.  Pt aware he will need a repeat echo in one year to follow-up on his dilated aorta. Pt aware I will go ahead and place the order for repeat echo in one year in the system and send a message to our [facility] Schedulers to call him back to arrange closer to that time.  Pt verbalized understanding and agrees with this plan.

## 2021-12-31 DIAGNOSIS — R1032 Left lower quadrant pain: Secondary | ICD-10-CM | POA: Diagnosis not present

## 2021-12-31 DIAGNOSIS — E119 Type 2 diabetes mellitus without complications: Secondary | ICD-10-CM | POA: Diagnosis not present

## 2022-01-08 ENCOUNTER — Other Ambulatory Visit: Payer: Self-pay

## 2022-01-08 DIAGNOSIS — E785 Hyperlipidemia, unspecified: Secondary | ICD-10-CM

## 2022-01-08 DIAGNOSIS — I2510 Atherosclerotic heart disease of native coronary artery without angina pectoris: Secondary | ICD-10-CM

## 2022-01-08 DIAGNOSIS — Z952 Presence of prosthetic heart valve: Secondary | ICD-10-CM

## 2022-01-08 DIAGNOSIS — E782 Mixed hyperlipidemia: Secondary | ICD-10-CM

## 2022-01-08 DIAGNOSIS — I10 Essential (primary) hypertension: Secondary | ICD-10-CM

## 2022-01-08 MED ORDER — SPIRONOLACTONE 25 MG PO TABS: 25.0000 mg | ORAL_TABLET | Freq: Every day | ORAL | 3 refills | Status: DC

## 2022-01-08 MED ORDER — AMLODIPINE BESYLATE 5 MG PO TABS: 5.0000 mg | ORAL_TABLET | Freq: Every day | ORAL | 3 refills | Status: DC

## 2022-01-08 MED ORDER — CARVEDILOL 3.125 MG PO TABS: 3.1250 mg | ORAL_TABLET | Freq: Two times a day (BID) | ORAL | 3 refills | Status: DC

## 2022-01-08 NOTE — Addendum Note (Signed)
Addended by: Carter Kitten D on: 01/08/2022 10:15 AM   Modules accepted: Orders

## 2022-02-26 DIAGNOSIS — E1169 Type 2 diabetes mellitus with other specified complication: Secondary | ICD-10-CM | POA: Diagnosis not present

## 2022-02-26 DIAGNOSIS — M545 Low back pain, unspecified: Secondary | ICD-10-CM | POA: Diagnosis not present

## 2022-02-26 DIAGNOSIS — N400 Enlarged prostate without lower urinary tract symptoms: Secondary | ICD-10-CM | POA: Diagnosis not present

## 2022-02-26 DIAGNOSIS — K6289 Other specified diseases of anus and rectum: Secondary | ICD-10-CM | POA: Diagnosis not present

## 2022-02-26 DIAGNOSIS — I2510 Atherosclerotic heart disease of native coronary artery without angina pectoris: Secondary | ICD-10-CM | POA: Diagnosis not present

## 2022-02-26 DIAGNOSIS — I69320 Aphasia following cerebral infarction: Secondary | ICD-10-CM | POA: Diagnosis not present

## 2022-02-26 DIAGNOSIS — I10 Essential (primary) hypertension: Secondary | ICD-10-CM | POA: Diagnosis not present

## 2022-04-11 DIAGNOSIS — H6591 Unspecified nonsuppurative otitis media, right ear: Secondary | ICD-10-CM | POA: Diagnosis not present

## 2022-04-11 DIAGNOSIS — H6991 Unspecified Eustachian tube disorder, right ear: Secondary | ICD-10-CM | POA: Diagnosis not present

## 2022-04-16 ENCOUNTER — Other Ambulatory Visit: Payer: Self-pay

## 2022-04-16 DIAGNOSIS — E785 Hyperlipidemia, unspecified: Secondary | ICD-10-CM

## 2022-04-16 DIAGNOSIS — Z952 Presence of prosthetic heart valve: Secondary | ICD-10-CM

## 2022-04-16 DIAGNOSIS — E782 Mixed hyperlipidemia: Secondary | ICD-10-CM

## 2022-04-16 DIAGNOSIS — I10 Essential (primary) hypertension: Secondary | ICD-10-CM

## 2022-04-16 DIAGNOSIS — I2510 Atherosclerotic heart disease of native coronary artery without angina pectoris: Secondary | ICD-10-CM

## 2022-04-16 MED ORDER — ROSUVASTATIN CALCIUM 20 MG PO TABS: 20.0000 mg | ORAL_TABLET | Freq: Every day | ORAL | 2 refills | Status: DC

## 2022-04-18 DIAGNOSIS — H6991 Unspecified Eustachian tube disorder, right ear: Secondary | ICD-10-CM | POA: Diagnosis not present

## 2022-04-26 ENCOUNTER — Other Ambulatory Visit: Payer: Self-pay

## 2022-04-26 MED ORDER — HYDROCHLOROTHIAZIDE 25 MG PO TABS: 12.5000 mg | ORAL_TABLET | Freq: Every day | ORAL | 0 refills | Status: DC

## 2022-05-21 ENCOUNTER — Encounter: Payer: Self-pay | Admitting: Cardiology

## 2022-05-24 DIAGNOSIS — G4733 Obstructive sleep apnea (adult) (pediatric): Secondary | ICD-10-CM | POA: Diagnosis not present

## 2022-05-29 DIAGNOSIS — L570 Actinic keratosis: Secondary | ICD-10-CM | POA: Diagnosis not present

## 2022-05-29 DIAGNOSIS — D485 Neoplasm of uncertain behavior of skin: Secondary | ICD-10-CM | POA: Diagnosis not present

## 2022-05-29 DIAGNOSIS — I10 Essential (primary) hypertension: Secondary | ICD-10-CM | POA: Diagnosis not present

## 2022-05-29 DIAGNOSIS — I2510 Atherosclerotic heart disease of native coronary artery without angina pectoris: Secondary | ICD-10-CM | POA: Diagnosis not present

## 2022-05-29 DIAGNOSIS — L821 Other seborrheic keratosis: Secondary | ICD-10-CM | POA: Diagnosis not present

## 2022-05-29 DIAGNOSIS — Z85828 Personal history of other malignant neoplasm of skin: Secondary | ICD-10-CM | POA: Diagnosis not present

## 2022-05-29 DIAGNOSIS — D2261 Melanocytic nevi of right upper limb, including shoulder: Secondary | ICD-10-CM | POA: Diagnosis not present

## 2022-05-29 DIAGNOSIS — I69320 Aphasia following cerebral infarction: Secondary | ICD-10-CM | POA: Diagnosis not present

## 2022-05-29 DIAGNOSIS — M545 Low back pain, unspecified: Secondary | ICD-10-CM | POA: Diagnosis not present

## 2022-05-29 DIAGNOSIS — D225 Melanocytic nevi of trunk: Secondary | ICD-10-CM | POA: Diagnosis not present

## 2022-05-29 DIAGNOSIS — D2271 Melanocytic nevi of right lower limb, including hip: Secondary | ICD-10-CM | POA: Diagnosis not present

## 2022-05-29 DIAGNOSIS — E1169 Type 2 diabetes mellitus with other specified complication: Secondary | ICD-10-CM | POA: Diagnosis not present

## 2022-05-29 DIAGNOSIS — N400 Enlarged prostate without lower urinary tract symptoms: Secondary | ICD-10-CM | POA: Diagnosis not present

## 2022-05-29 DIAGNOSIS — D2262 Melanocytic nevi of left upper limb, including shoulder: Secondary | ICD-10-CM | POA: Diagnosis not present

## 2022-05-29 DIAGNOSIS — G4733 Obstructive sleep apnea (adult) (pediatric): Secondary | ICD-10-CM | POA: Diagnosis not present

## 2022-05-29 DIAGNOSIS — K6289 Other specified diseases of anus and rectum: Secondary | ICD-10-CM | POA: Diagnosis not present

## 2022-05-29 DIAGNOSIS — D2272 Melanocytic nevi of left lower limb, including hip: Secondary | ICD-10-CM | POA: Diagnosis not present

## 2022-05-29 DIAGNOSIS — C44519 Basal cell carcinoma of skin of other part of trunk: Secondary | ICD-10-CM | POA: Diagnosis not present

## 2022-06-14 ENCOUNTER — Other Ambulatory Visit: Payer: Self-pay

## 2022-06-14 MED ORDER — AMOXICILLIN 500 MG PO TABS: ORAL_TABLET | ORAL | 2 refills | Status: DC

## 2022-06-17 NOTE — Progress Notes (Unsigned)
Cardiology Office Note:    Date:  06/20/2022   ID:  [name], DOB December 13, 1945, MRN [id number]  PCP:  Cyndi Bender, PA-C   [facility] Providers Cardiologist:  Freada Bergeron, MD Structural Heart:  Lauree Chandler, MD {  Referring MD: Cyndi Bender, PA-C     History of Present Illness:    [name] is a 77 y.o. male with a hx of AS s/p 81m S3 TAVR 10/2018, CAD with mild to moderate disease on cath in 10/2018, prior CVA, HTN, HLD, OSA on CPAP, and history of seizures who was previously followed by Dr. NMeda Coffeewho now presents to clinic for follow-up.  Was seen in  10/09/20 where he was doing well without anginal or HF symptoms. TTE 11/08/20 with normal LVEF 60-65%, mild paravalvular leak (stable since 2020), MG 142mg, DI 0.31 ascending aortic dilation of 4261mHe had no HF symptoms.   Was last seen in clinic on 11/2021 where he was having orthostatic symptoms. We decreased his tamsulosin to daily dosing and decreased HCTZ to 12.5mg92mily. TTE 11/2021 with EF 60-65%, G1DD, mean AoV gradient stable at 16mm98mith mild PVL, ascending aorta 41mm.74mday,  the patient overall feels well today. No chest pain, SOB, LE edema, orthopnea, or PND. Blood pressure is well controlled with very minimal orthostatic symptoms. Tolerating medications as prescribed. Remains active with no exertional chest pain or SOB.   Past Medical History:  Diagnosis Date   Aortic stenosis, severe    Arthritis    "hands" (02/04/2018)   Basal cell carcinoma    "MOHS surgery on sides of my nose" (02/04/2018)   Chronic lower back pain    CVA (cerebral vascular accident) (HCC) 0[place]6/2019   CVA (cerebral vascular accident) (HCC) [place]   Diarrhea    Difficulty urinating    Heart murmur    no problems   High cholesterol    Hypertension    OSA on CPAP    "just got CPAP today" (02/04/2018)   Rectal bleeding    Rectal pain    S/P TAVR (transcatheter aortic valve replacement) 11/03/2018   26 mm  Edwards Sapien 3 transcatheter heart valve placed via percutaneous right transfemoral approach    [place] we were not aware that he had" (02/04/2018)    Past Surgical History:  Procedure Laterality Date   ANTERIOR CERVICAL DECOMP/DISCECTOMY FUSION  2009   BACK SURGERY  1980's   COLONOSCOPY     x 2   EXCISIONAL HEMORRHOIDECTOMY  1989 -2011 X 2 or 3   LAPAROSCOPIC CHOLECYSTECTOMY  2005   LUMBAR DISC [place] MOHS SURGERY Bilateral 2006   "sides of my nose" x 2   RIGHT/LEFT HEART CATH AND CORONARY ANGIOGRAPHY N/A 10/21/2018   Procedure: RIGHT/LEFT HEART CATH AND CORONARY ANGIOGRAPHY;  Surgeon: McAlhaBurnell Blanks [facility];  Service: Cardiovascular;  Laterality: N/A;   TEE WITHOUT CARDIOVERSION N/A 02/03/2018   Procedure: TRANSESOPHAGEAL ECHOCARDIOGRAM (TEE);  Surgeon: CrenshLelon Perla [facility];  Service: Cardiovascular;  Laterality: N/A;   TEE WITHOUT CARDIOVERSION N/A 11/03/2018   Procedure: TRANSESOPHAGEAL ECHOCARDIOGRAM (TEE);  Surgeon: McAlhaBurnell Blanks [facility];  Service: Open Heart Surgery;  Laterality: N/A;   TRANSCATHETER AORTIC VALVE REPLACEMENT, TRANSFEMORAL N/A 11/03/2018   Procedure: TRANSCATHETER AORTIC VALVE REPLACEMENT, TRANSFEMORAL;  Surgeon: McAlhaBurnell Blanks  Location: [place] [facility];  Service: Open Heart Surgery;  Laterality: N/A;   WISDOM TOOTH EXTRACTION      Current Medications: Current Meds  Medication Sig   acetaminophen (TYLENOL) 650 MG CR tablet Take 650 mg by mouth every 8 (eight) hours as needed.   amLODipine (NORVASC) 5 MG tablet Take 1 tablet (5 mg total) by mouth daily.   amoxicillin (AMOXIL) 500 MG tablet Take 4 tablets (2,000 mg total) by mouth one hour prior to all dental visits.   aspirin EC 81 MG EC tablet Take 1 tablet (81 mg total) by mouth daily.   carvedilol (COREG) 3.125 MG tablet Take 1  tablet (3.125 mg total) by mouth 2 (two) times daily.   doxycycline (VIBRAMYCIN) 100 MG capsule Take 100 mg by mouth 2 (two) times daily.   dutasteride (AVODART) 0.5 MG capsule Take 0.5 mg by mouth daily.   FARXIGA 10 MG TABS tablet Take 10 mg by mouth daily.   gabapentin (NEURONTIN) 100 MG capsule Take 100 mg by mouth at bedtime.   hydrochlorothiazide (HYDRODIURIL) 25 MG tablet Take 0.5 tablets (12.5 mg total) by mouth daily.   losartan (COZAAR) 100 MG tablet TAKE 1 TABLET BY MOUTH EVERY DAY   Metformin HCl 500 MG/5ML SOLN Take 500 mg by mouth at bedtime.   rosuvastatin (CRESTOR) 20 MG tablet Take 1 tablet (20 mg total) by mouth daily.   spironolactone (ALDACTONE) 25 MG tablet Take 1 tablet (25 mg total) by mouth daily.   tamsulosin (FLOMAX) 0.4 MG CAPS capsule Take 0.4 mg by mouth daily.     Allergies:   Atorvastatin   Social History   Socioeconomic History   Marital status: Married    Spouse name: Not on file   Number of children: Not on file   Years of education: Not on file   Highest education level: Not on file  Occupational History   Occupation: retired  Tobacco Use   Smoking status: Never   Smokeless tobacco: Never  Vaping Use   Vaping Use: Never used  Substance and Sexual Activity   Alcohol use: Not Currently    Alcohol/week: 0.0 standard drinks of alcohol   Drug use: Not Currently   Sexual activity: Not Currently  Other Topics Concern   Not on file  Social History Narrative   Lives with his wife in [place], [place]   Social Determinants of Health   Financial Resource Strain: Not on file  Food Insecurity: Not on file  Transportation Needs: Not on file  Physical Activity: Not on file  Stress: Not on file  Social Connections: Not on file     Family History: The patient's family history includes Cancer in his mother; Heart disease in his father. There is no history of Stroke.  ROS:   Please see the history of present illness.    Review of Systems   Constitutional:  Negative for chills and fever.  HENT:  Negative for sore throat.   Eyes:  Negative for blurred vision.  Respiratory:  Negative for shortness of breath.   Cardiovascular:  Negative for chest pain, palpitations, orthopnea, claudication, leg swelling and PND.  Gastrointestinal:  Negative for nausea and vomiting.  Genitourinary:  Negative for flank pain and hematuria.  Musculoskeletal:  Positive for back pain and myalgias. Negative for falls.  Skin:  Negative for itching and rash.  Neurological:  Negative for loss of consciousness.  Endo/Heme/Allergies:  Negative for polydipsia. Does not bruise/bleed easily.  Psychiatric/Behavioral:  Negative for substance  abuse.     EKGs/Labs/Other Studies Reviewed:    The following studies were reviewed today: TTE Dec 08, 2021: IMPRESSIONS     1. Left ventricular ejection fraction, by estimation, is 60 to 65%. The  left ventricle has normal function. The left ventricle has no regional  wall motion abnormalities. There is mild left ventricular hypertrophy.  Left ventricular diastolic parameters  are consistent with Grade I diastolic dysfunction (impaired relaxation).   2. Right ventricular systolic function is normal. The right ventricular  size is normal.   3. The mitral valve is normal in structure. No evidence of mitral valve  regurgitation. No evidence of mitral stenosis.   4. Prior TAVR gradient 16 mmHg, unchanged. Mild PVL. The aortic valve has  been repaired/replaced. Aortic valve regurgitation is not visualized. No  aortic stenosis is present. There is a 26 mm Sapien prosthetic (TAVR)  valve present in the aortic  position. Aortic valve mean gradient measures 16.0 mmHg. Aortic valve Vmax  measures 2.68 m/s.   5. Aortic dilatation noted. There is mild dilatation of the ascending  aorta, measuring 41 mm.   6. The inferior vena cava is normal in size with greater than 50%  respiratory variability, suggesting right atrial  pressure of 3 mmHg.   Comparison(s): Prior images reviewed side by side. Prior TAVR gradient 16  mmHg, unchanged. Mild PVL.   TTE 11/08/20: IMPRESSIONS   1. 26 mm S3 (11/03/2018). V max 2.8 m/s, MG 16 mmHG, EOA 1.54 cm2, DI  0.31. There is mild paravalvular leak in the 11-1 o'clock position. PVL  has not changed from prior. The aortic valve has been repaired/replaced.  Aortic valve regurgitation is mild.  There is a 26 mm Sapien prosthetic (TAVR) valve present in the aortic  position. Procedure Date: 11/03/2018.   2. Left ventricular ejection fraction, by estimation, is 60 to 65%. The  left ventricle has normal function. The left ventricle has no regional  wall motion abnormalities. There is moderate concentric left ventricular  hypertrophy. Left ventricular  diastolic parameters are consistent with Grade I diastolic dysfunction  (impaired relaxation).   3. Right ventricular systolic function is normal. The right ventricular  size is normal. Tricuspid regurgitation signal is inadequate for assessing  PA pressure.   4. The mitral valve is grossly normal. Trivial mitral valve  regurgitation. No evidence of mitral stenosis.   5. The inferior vena cava is normal in size with greater than 50%  respiratory variability, suggesting right atrial pressure of 3 mmHg.   Comparison(s): No significant change from prior study. AoV prosthesis  remains unchanged in gradient and PVL.   Echocardiogram 11/03/19 IMPRESSIONS   1. Left ventricular ejection fraction, by estimation, is 60 to 65%. The  left ventricle has normal function. The left ventricle has no regional  wall motion abnormalities. There is mild concentric left ventricular  hypertrophy. Left ventricular diastolic  parameters are consistent with Grade I diastolic dysfunction (impaired  relaxation). Elevated left atrial pressure.   2. Right ventricular systolic function is normal. The right ventricular  size is normal.   3. Left atrial  size was mildly dilated.   4. The mitral valve is normal in structure. Trivial mitral valve  regurgitation.   5. The aortic valve has been repaired/replaced. Aortic valve  regurgitation is mild to moderate. There is a 26 mm Edwards Sapien  prosthetic (TAVR) valve present in the aortic position. Procedure Date:  11/03/18. Echo findings are consistent with  perivalvular leak of the aortic prosthesis. Aortic  valve mean gradient  measures 16.0 mmHg. Aortic valve Vmax measures 2.87 m/s.   6. Aortic dilatation noted. There is mild dilatation of the ascending  aorta measuring 42 mm.    Cardiac catheterization 10/21/2018 LAD dist 20 LCx ost 20, mid 20 RCA mid 40   07/08/19: CTA NECK FINDINGS   Aortic arch: Great vessel origins are patent.   Right carotid system: Patent.  No measurable stenosis.   Left carotid system: Patent. There is mild noncalcified plaque at the ICA origin causing minimal stenosis.   Vertebral arteries: Patent and codominant.   Skeleton: Postoperative changes of anterior fusion at C4-C6. Cervical spine degenerative changes.   Other neck: No mass or adenopathy.   Upper chest: No apical lung mass.   Review of the MIP images confirms the above findings   CTA HEAD FINDINGS   Anterior circulation: Intracranial internal carotid arteries are patent with mild calcified plaque. Anterior and middle cerebral arteries are patent.   Posterior circulation: Intracranial vertebral arteries, basilar artery, and posterior cerebral arteries are patent   Venous sinuses: As permitted by contrast timing, patent.   Review of the MIP images confirms the above findings   IMPRESSION: No acute intracranial abnormality. Several chronic infarcts. 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   No large vessel occlusion, hemodynamically significant stenosis, or evidence of dissection.  EKG:   No new tracing today  Recent Labs: No results found for  requested labs within last 365 days.  Recent Lipid Panel    Component Value Date/Time   CHOL 153 10/09/2020 1058   TRIG 228 (H) 10/09/2020 1058   HDL 41 10/09/2020 1058   CHOLHDL 3.7 10/09/2020 1058   CHOLHDL 6.9 01/30/2018 0347   VLDL 64 (H) 01/30/2018 0347   LDLCALC 74 10/09/2020 1058      Physical Exam:    VS:  BP 117/60   Pulse 70   Ht 5' 10"$  (1.778 m)   Wt 253 lb 12.8 oz (115.1 kg)   SpO2 96%   BMI 36.42 kg/m     Wt Readings from Last 3 Encounters:  06/20/22 253 lb 12.8 oz (115.1 kg)  11/09/21 252 lb 3.2 oz (114.4 kg)  03/27/21 259 lb 9.6 oz (117.8 kg)     GEN:  Well nourished, well developed in no acute distress HEENT: Normal NECK: No JVD; No carotid bruits CARDIAC: RRR, 2/6 systolic murmur. No rubs or gallops RESPIRATORY:  Clear to auscultation without rales, wheezing or rhonchi  ABDOMEN: Soft, non-tender, non-distended MUSCULOSKELETAL:  No edema; No deformity  SKIN: Warm and dry NEUROLOGIC:  Alert and oriented x 3 PSYCHIATRIC:  Normal affect   ASSESSMENT:    1. S/P TAVR (transcatheter aortic valve replacement)   2. Mixed hyperlipidemia   3. Essential hypertension   4. Hyperlipidemia LDL goal <70   5. Severe aortic stenosis   6. Ascending aorta dilation (HCC)     PLAN:    In order of problems listed above:  #Severe AS s/p TAVR: Stable on TTE 11/2021 with LVEF 60-65%, well seated TAVR valve with mild paravalvular leak, mean gradient 54mHg. No anginal or HF symptoms. -Continue serial monitoring with TTE -Continue IE ppx prior to dental procedures  #Mild-to-moderate CAD without angina: No anginal symptoms. -Continue ASA 894mdaily -Continue crestor 2066maily -Continue coreg 3.125m40mD -Continue losartan 100mg75mly  #HTN: #Concern for Orthostasis: Improved with adjusting HCTZ and tamsulosin dosing with very rare symptoms.  -Continue amlodipine 5mg d31my -Continue HCTZ 12.5mg da81m -Continue  spironolactone 91m daily -Continue losartan  1022mdaily -Continue coreg 3.12556mID  #HLD: LDL cholesterol 53 on 08/2021. -Continue crestor 25m31mily -Diet and lifestyle modifications  #Mild Ascending Aorta Dilation: Measures 42mm23mTTE 11/03/19, however appeared normal in 11/2020. CTA in 2020 normal. -No further monitoring needed as appeared normal on TTE in 11/2020 and CTA chest in 2020     Medication Adjustments/Labs and Tests Ordered: Current medicines are reviewed at length with the patient today.  Concerns regarding medicines are outlined above.  No orders of the defined types were placed in this encounter.   No orders of the defined types were placed in this encounter.    Patient Instructions  Medication Instructions:   Your physician recommends that you continue on your current medications as directed. Please refer to the Current Medication list given to you today.  *If you need a refill on your cardiac medications before your next appointment, please call your pharmacy*    Follow-Up: At [facility] and your health needs are our priority.  As part of our continuing mission to provide you with exceptional heart care, we have created designated Provider Care Teams.  These Care Teams include your primary Cardiologist (physician) and Advanced Practice Providers (APPs -  Physician Assistants and Nurse Practitioners) who all work together to provide you with the care you need, when you need it.  We recommend signing up for the patient portal called "MyChart".  Sign up information is provided on this After Visit Summary.  MyChart is used to connect with patients for Virtual Visits (Telemedicine).  Patients are able to view lab/test results, encounter notes, upcoming appointments, etc.  Non-urgent messages can be sent to your provider as well.   To learn more about what you can do with MyChart, go to [email or web address] next appointment:   6 month(s)  Provider:   HeathFreada Bergeron        Signed, HeathFreada Bergeron 06/20/2022 12:24 PM    [facility]

## 2022-06-20 ENCOUNTER — Encounter: Payer: Self-pay | Admitting: Cardiology

## 2022-06-20 ENCOUNTER — Ambulatory Visit: Payer: Medicare PPO | Attending: Cardiology | Admitting: Cardiology

## 2022-06-20 VITALS — BP 117/60 | HR 70 | Ht 70.0 in | Wt 253.8 lb

## 2022-06-20 DIAGNOSIS — I10 Essential (primary) hypertension: Secondary | ICD-10-CM | POA: Diagnosis not present

## 2022-06-20 DIAGNOSIS — I77810 Thoracic aortic ectasia: Secondary | ICD-10-CM

## 2022-06-20 DIAGNOSIS — I350 Nonrheumatic aortic (valve) stenosis: Secondary | ICD-10-CM | POA: Diagnosis not present

## 2022-06-20 DIAGNOSIS — E785 Hyperlipidemia, unspecified: Secondary | ICD-10-CM

## 2022-06-20 DIAGNOSIS — E782 Mixed hyperlipidemia: Secondary | ICD-10-CM

## 2022-06-20 DIAGNOSIS — Z952 Presence of prosthetic heart valve: Secondary | ICD-10-CM

## 2022-06-20 NOTE — Patient Instructions (Signed)
Medication Instructions:   Your physician recommends that you continue on your current medications as directed. Please refer to the Current Medication list given to you today.  *If you need a refill on your cardiac medications before your next appointment, please call your pharmacy*    Follow-Up: At [facility], you and your health needs are our priority.  As part of our continuing mission to provide you with exceptional heart care, we have created designated Provider Care Teams.  These Care Teams include your primary Cardiologist (physician) and Advanced Practice Providers (APPs -  Physician Assistants and Nurse Practitioners) who all work together to provide you with the care you need, when you need it.  We recommend signing up for the patient portal called "MyChart".  Sign up information is provided on this After Visit Summary.  MyChart is used to connect with patients for Virtual Visits (Telemedicine).  Patients are able to view lab/test results, encounter notes, upcoming appointments, etc.  Non-urgent messages can be sent to your provider as well.   To learn more about what you can do with MyChart, go to [email or web address].    Your next appointment:   6 month(s)  Provider:   Freada Bergeron, MD

## 2022-07-29 DIAGNOSIS — R32 Unspecified urinary incontinence: Secondary | ICD-10-CM | POA: Diagnosis not present

## 2022-07-29 DIAGNOSIS — K6289 Other specified diseases of anus and rectum: Secondary | ICD-10-CM | POA: Diagnosis not present

## 2022-07-29 DIAGNOSIS — E785 Hyperlipidemia, unspecified: Secondary | ICD-10-CM | POA: Diagnosis not present

## 2022-07-29 DIAGNOSIS — E1142 Type 2 diabetes mellitus with diabetic polyneuropathy: Secondary | ICD-10-CM | POA: Diagnosis not present

## 2022-07-29 DIAGNOSIS — I10 Essential (primary) hypertension: Secondary | ICD-10-CM | POA: Diagnosis not present

## 2022-07-29 DIAGNOSIS — I2510 Atherosclerotic heart disease of native coronary artery without angina pectoris: Secondary | ICD-10-CM | POA: Diagnosis not present

## 2022-07-29 DIAGNOSIS — G4733 Obstructive sleep apnea (adult) (pediatric): Secondary | ICD-10-CM | POA: Diagnosis not present

## 2022-07-29 DIAGNOSIS — N529 Male erectile dysfunction, unspecified: Secondary | ICD-10-CM | POA: Diagnosis not present

## 2022-08-28 DIAGNOSIS — I2510 Atherosclerotic heart disease of native coronary artery without angina pectoris: Secondary | ICD-10-CM | POA: Diagnosis not present

## 2022-08-28 DIAGNOSIS — I69320 Aphasia following cerebral infarction: Secondary | ICD-10-CM | POA: Diagnosis not present

## 2022-08-28 DIAGNOSIS — M545 Low back pain, unspecified: Secondary | ICD-10-CM | POA: Diagnosis not present

## 2022-08-28 DIAGNOSIS — Z1331 Encounter for screening for depression: Secondary | ICD-10-CM | POA: Diagnosis not present

## 2022-08-28 DIAGNOSIS — I10 Essential (primary) hypertension: Secondary | ICD-10-CM | POA: Diagnosis not present

## 2022-08-28 DIAGNOSIS — E1169 Type 2 diabetes mellitus with other specified complication: Secondary | ICD-10-CM | POA: Diagnosis not present

## 2022-08-28 DIAGNOSIS — N400 Enlarged prostate without lower urinary tract symptoms: Secondary | ICD-10-CM | POA: Diagnosis not present

## 2022-08-28 DIAGNOSIS — Z9181 History of falling: Secondary | ICD-10-CM | POA: Diagnosis not present

## 2022-08-28 DIAGNOSIS — Z125 Encounter for screening for malignant neoplasm of prostate: Secondary | ICD-10-CM | POA: Diagnosis not present

## 2022-08-28 DIAGNOSIS — K6289 Other specified diseases of anus and rectum: Secondary | ICD-10-CM | POA: Diagnosis not present

## 2022-09-14 ENCOUNTER — Encounter: Payer: Self-pay | Admitting: Cardiology

## 2022-10-16 ENCOUNTER — Other Ambulatory Visit: Payer: Self-pay

## 2022-10-16 MED ORDER — HYDROCHLOROTHIAZIDE 25 MG PO TABS: 12.5000 mg | ORAL_TABLET | Freq: Every day | ORAL | 2 refills | Status: DC

## 2022-10-31 ENCOUNTER — Ambulatory Visit: Payer: Medicare Other | Admitting: Podiatry

## 2022-10-31 DIAGNOSIS — B351 Tinea unguium: Secondary | ICD-10-CM

## 2022-10-31 DIAGNOSIS — L853 Xerosis cutis: Secondary | ICD-10-CM | POA: Diagnosis not present

## 2022-10-31 MED ORDER — AMMONIUM LACTATE 12 % EX CREA: 1.0000 | TOPICAL_CREAM | CUTANEOUS | 0 refills | Status: AC | PRN

## 2022-10-31 MED ORDER — CICLOPIROX 8 % EX SOLN: Freq: Every day | CUTANEOUS | 2 refills | Status: DC

## 2022-10-31 NOTE — Progress Notes (Signed)
Subjective:   Patient ID: [name], male   DOB: 77 y.o.   MRN: [id number]   HPI Chief Complaint  Patient presents with   Ingrown Toenail    Ingrown right great toe,medial border. Patient has had it removed in the past. Patient denies pain at this time but it was painful a couple of days ago.    Nail Problem    Nail fungus to bilateral feet. Patient has been treated a couple of years ago with an oral medication and it cleared the fungus but it returned.    Eczema    Dry and cracked skin to bilateral heels.     Fingus on the 3rd toes- previously had it on all toes and was on oral medication. After 2 months it cleared up .  He was on program as well.  Denies any injury causing swelling.   Type 2 diabetes- 7.4 last A1c he reports  No open lesions.  Review of Systems  All other systems reviewed and are negative.  Past Medical History:  Diagnosis Date   Aortic stenosis, severe    Arthritis    "hands" (02/04/2018)   Basal cell carcinoma    "MOHS surgery on sides of my nose" (02/04/2018)   Chronic lower back pain    CVA (cerebral vascular accident) (HCC) 01/29/2018   CVA (cerebral vascular accident) (HCC) 02/01/2018   Diarrhea    Difficulty urinating    Heart murmur    no problems   High cholesterol    Hypertension    OSA on CPAP    "just got CPAP today" (02/04/2018)   Rectal bleeding    Rectal pain    S/P TAVR (transcatheter aortic valve replacement) 11/03/2018   26 mm Edwards Sapien 3 transcatheter heart valve placed via percutaneous right transfemoral approach    Stroke (HCC)    "we were told that MRI showed previous stroke we were not aware that he had" (02/04/2018)    Past Surgical History:  Procedure Laterality Date   ANTERIOR CERVICAL DECOMP/DISCECTOMY FUSION  2009   BACK SURGERY  1980's   COLONOSCOPY     x 2   EXCISIONAL HEMORRHOIDECTOMY  1989 -2011 X 2 or 3   LAPAROSCOPIC CHOLECYSTECTOMY  2005   LUMBAR DISC SURGERY  1980s X 2   MOHS SURGERY Bilateral 2006    "sides of my nose" x 2   RIGHT/LEFT HEART CATH AND CORONARY ANGIOGRAPHY N/A 10/21/2018   Procedure: RIGHT/LEFT HEART CATH AND CORONARY ANGIOGRAPHY;  Surgeon: Kathleene Hazel, MD;  [facility];  Service: Cardiovascular;  Laterality: N/A;   TEE WITHOUT CARDIOVERSION N/A 02/03/2018   Procedure: TRANSESOPHAGEAL ECHOCARDIOGRAM (TEE);  Surgeon: Lewayne Bunting, MD;  Location: [facility];  Service: Cardiovascular;  Laterality: N/A;   TEE WITHOUT CARDIOVERSION N/A 11/03/2018   Procedure: TRANSESOPHAGEAL ECHOCARDIOGRAM (TEE);  Surgeon: Kathleene Hazel, MD;  [facility]: [facility];  Service: Open Heart Surgery;  Laterality: N/A;   TRANSCATHETER AORTIC VALVE REPLACEMENT, TRANSFEMORAL N/A 11/03/2018   Procedure: TRANSCATHETER AORTIC VALVE REPLACEMENT, TRANSFEMORAL;  Surgeon: Kathleene Hazel, MD;  [facility];  Service: Open Heart Surgery;  Laterality: N/A;   WISDOM TOOTH EXTRACTION       Current Outpatient Medications:    ammonium lactate (AMLACTIN) 12 % cream, Apply 1 Application topically as needed for dry skin., Disp: 385 g, Rfl: 0   ciclopirox (PENLAC) 8 % solution, Apply topically at bedtime. Apply over nail and surrounding skin. Apply  daily over previous coat. After seven (7) days, may remove with alcohol and continue cycle., Disp: 6.6 mL, Rfl: 2   acetaminophen (TYLENOL) 650 MG CR tablet, Take 650 mg by mouth every 8 (eight) hours as needed., Disp: , Rfl:    amLODipine (NORVASC) 5 MG tablet, Take 1 tablet (5 mg total) by mouth daily., Disp: 90 tablet, Rfl: 3   amoxicillin (AMOXIL) 500 MG tablet, Take 4 tablets (2,000 mg total) by mouth one hour prior to all dental visits., Disp: 8 tablet, Rfl: 2   aspirin EC 81 MG EC tablet, Take 1 tablet (81 mg total) by mouth daily., Disp: 30 tablet, Rfl: 3   carvedilol (COREG) 3.125 MG tablet, Take 1 tablet (3.125 mg total) by mouth 2 (two) times daily., Disp: 180 tablet, Rfl: 3   doxycycline  (VIBRAMYCIN) 100 MG capsule, Take 100 mg by mouth 2 (two) times daily., Disp: , Rfl:    dutasteride (AVODART) 0.5 MG capsule, Take 0.5 mg by mouth daily., Disp: , Rfl: 3   FARXIGA 10 MG TABS tablet, Take 10 mg by mouth daily., Disp: , Rfl:    gabapentin (NEURONTIN) 100 MG capsule, Take 100 mg by mouth at bedtime., Disp: , Rfl:    hydrochlorothiazide (HYDRODIURIL) 25 MG tablet, Take 0.5 tablets (12.5 mg total) by mouth daily., Disp: 45 tablet, Rfl: 2   losartan (COZAAR) 100 MG tablet, TAKE 1 TABLET BY MOUTH EVERY DAY, Disp: 90 tablet, Rfl: 3   Metformin HCl 500 MG/5ML SOLN, Take 500 mg by mouth at bedtime., Disp: , Rfl:    rosuvastatin (CRESTOR) 20 MG tablet, Take 1 tablet (20 mg total) by mouth daily., Disp: 90 tablet, Rfl: 2   spironolactone (ALDACTONE) 25 MG tablet, Take 1 tablet (25 mg total) by mouth daily., Disp: 90 tablet, Rfl: 3   tamsulosin (FLOMAX) 0.4 MG CAPS capsule, Take 0.4 mg by mouth daily., Disp: , Rfl:   Allergies  Allergen Reactions   Atorvastatin Other (See Comments)    Pt reports causes muscle aches, constipation, and GI issues.            Objective:  Physical Exam  General: AAO x3, NAD  Dermatological: The 3rd digit toenails are hypertrophic and dystrophic with yellow discoloration and subungual debris is present.  There is no edema, erythema or signs of infection.  Dry skin is present.  There is no open lesions or drainage or signs of infection.   Vascular: Dorsalis Pedis artery and Posterior Tibial artery pedal pulses are 2/4 bilateral with immedate capillary fill time.  There is no pain with calf compression, swelling, warmth, erythema.   Neruologic: Grossly intact via light touch bilateral.  Musculoskeletal: No gross boney pedal deformities bilateral. No pain, crepitus, or limitation noted with foot and ankle range of motion bilateral. Muscular strength 5/5 in all groups tested bilateral.  Gait: Unassisted, Nonantalgic.       Assessment:   77 year old  male with onychomycosis, dry skin     Plan:  -Treatment options discussed including all alternatives, risks, and complications -Etiology of symptoms were discussed -As a courtesy debrided the nails without any complications or bleeding. -Gust treatment options for nail fungus.  Both Lamisil previously but decided to use topical care prescribed Penlac.  Discussed side effects, duration of use. -Amlactin for dry skin. Can consider a steroid cream, but it appears to be more of dry skin today.  Return if symptoms worsen or fail to improve.  Vivi Barrack DPM

## 2022-10-31 NOTE — Patient Instructions (Signed)

## 2022-11-11 ENCOUNTER — Other Ambulatory Visit: Payer: Self-pay

## 2022-11-11 MED ORDER — LOSARTAN POTASSIUM 100 MG PO TABS: 100.0000 mg | ORAL_TABLET | Freq: Every day | ORAL | 2 refills | Status: DC

## 2022-11-18 ENCOUNTER — Ambulatory Visit (HOSPITAL_COMMUNITY): Payer: Medicare PPO | Attending: Cardiology

## 2022-11-18 DIAGNOSIS — Z952 Presence of prosthetic heart valve: Secondary | ICD-10-CM | POA: Diagnosis not present

## 2022-11-18 DIAGNOSIS — I5030 Unspecified diastolic (congestive) heart failure: Secondary | ICD-10-CM | POA: Diagnosis not present

## 2022-11-18 DIAGNOSIS — I77819 Aortic ectasia, unspecified site: Secondary | ICD-10-CM

## 2022-11-18 LAB — ECHOCARDIOGRAM COMPLETE
AR max vel: 1.58 cm2
AV Area VTI: 1.62 cm2
AV Area mean vel: 1.63 cm2
AV Mean grad: 17 mmHg
AV Peak grad: 32.3 mmHg
Ao pk vel: 2.84 m/s
Area-P 1/2: 3.97 cm2
P 1/2 time: 690 msec
S' Lateral: 3.2 cm

## 2022-11-19 ENCOUNTER — Telehealth: Payer: Self-pay | Admitting: *Deleted

## 2022-11-19 DIAGNOSIS — Z952 Presence of prosthetic heart valve: Secondary | ICD-10-CM

## 2022-11-19 DIAGNOSIS — I77819 Aortic ectasia, unspecified site: Secondary | ICD-10-CM

## 2022-11-19 DIAGNOSIS — I77810 Thoracic aortic ectasia: Secondary | ICD-10-CM

## 2022-11-19 NOTE — Telephone Encounter (Signed)
-----   Message from Meriam Sprague sent at 11/18/2022  8:45 PM EDT ----- His echo shows normal pumping function and moderate thickening of the hear muscle which is related to his history of a severely narrowed aortic valve high blood pressure and. His TAVR valve looks great with mild leakiness and normal function. His aorta remains mildly dilated at 40mm Overall this is stable from prior and will continue with yearly echoes going forward.

## 2022-11-19 NOTE — Telephone Encounter (Signed)
The patient has been notified of the result and verbalized understanding.  All questions (if any) were answered.  Pt aware he will need a repeat echo in one year for surveillance.  He is aware that I will place the order and have our echo scheduler reach out to him to arrange this appt.  Advised him to always maintain good BP control, take his meds, and watch his salt intake.    Advised him to follow-up with Tereso Newcomer PA-C as scheduled in August.   Pt verbalized understanding and agrees with this plan.

## 2022-11-23 DIAGNOSIS — G4733 Obstructive sleep apnea (adult) (pediatric): Secondary | ICD-10-CM | POA: Diagnosis not present

## 2022-11-29 DIAGNOSIS — G4733 Obstructive sleep apnea (adult) (pediatric): Secondary | ICD-10-CM | POA: Diagnosis not present

## 2022-12-02 DIAGNOSIS — N400 Enlarged prostate without lower urinary tract symptoms: Secondary | ICD-10-CM | POA: Diagnosis not present

## 2022-12-02 DIAGNOSIS — M545 Low back pain, unspecified: Secondary | ICD-10-CM | POA: Diagnosis not present

## 2022-12-02 DIAGNOSIS — G4733 Obstructive sleep apnea (adult) (pediatric): Secondary | ICD-10-CM | POA: Diagnosis not present

## 2022-12-02 DIAGNOSIS — Z139 Encounter for screening, unspecified: Secondary | ICD-10-CM | POA: Diagnosis not present

## 2022-12-02 DIAGNOSIS — I2510 Atherosclerotic heart disease of native coronary artery without angina pectoris: Secondary | ICD-10-CM | POA: Diagnosis not present

## 2022-12-02 DIAGNOSIS — E1169 Type 2 diabetes mellitus with other specified complication: Secondary | ICD-10-CM | POA: Diagnosis not present

## 2022-12-02 DIAGNOSIS — I69320 Aphasia following cerebral infarction: Secondary | ICD-10-CM | POA: Diagnosis not present

## 2022-12-02 DIAGNOSIS — K6289 Other specified diseases of anus and rectum: Secondary | ICD-10-CM | POA: Diagnosis not present

## 2022-12-02 DIAGNOSIS — I10 Essential (primary) hypertension: Secondary | ICD-10-CM | POA: Diagnosis not present

## 2022-12-27 ENCOUNTER — Ambulatory Visit: Payer: Medicare PPO | Admitting: Cardiology

## 2022-12-30 NOTE — Progress Notes (Unsigned)
Cardiology Office Note:  .   Date:  12/31/2022  ID:  [name], DOB 30-Jan-1946, MRN [id number] PCP: Lonie Peak, PA-C  [facility] Providers Cardiologist:  Meriam Sprague, MD Structural Heart:  Verne Carrow, MD   History of Present Illness: .   [name] is a 77 y.o. male with past medical history of AS s/p 26mm S3 TAVR in 10/2018, CAD with mild to moderate disease on cath in 10/2018, prior CVA, HTN, HLD, OSA on CPAP and history of seizures who was previously followed by Dr. Shari Prows. He presents today for follow up.   TTE in 11/09/2020 showed normal LVEF 60 to 65%, mild paravalcular leak that has remained stable since 2020, MG 16 mmHg, ascending aortic dilation at 42mm.   On OV in 11/2021 he reported having orthostatic symptoms. His tamsulosin was decreased to daily and his hydrochlorothiazide was decreased to 12.5 mg daily. TTE 11/2021 with EF 60-65%, G1DD, mean AoV gradient stable at with mild PVL, ascending aorta 41mm.   He was last seen in 06/2022 by Dr. Shari Prows for follow up. He had remained stable from a cardiac standpoint. Echo on 11/2022 showed LVEF of 60 to 65%, LV with normal function and no RWMA, moderate concentric LVH. GIDD. Paravalvular regurgitation was mild, no aortic stenosis present, aortic dilatation was stable at 40 mm.   Today he presents for follow up. He reports he is doing very well overall. He has no concerns or complaints today. Reports very rare episodes of dizziness with fast position changes, recovers within a few seconds, notes significant improvement. He denies chest pain, shortness of breath, lower extremity edema or palpitations. He has been working with his neighbor to put up a fence around his neighbors garden, tolerated very well. He has been working to lose weight, reports overall improvement in his diet.   Recent labs 08/28/22: HGB 14.1, creatinine 1.130, potassium 4.8, ALT 14  ROS: Today he denies chest pain, shortness of  breath, lower extremity edema, fatigue, palpitations, melena, hematuria, hemoptysis, diaphoresis, weakness, presyncope, syncope, orthopnea, and PND.   Studies Reviewed: Marland Kitchen   EKG Interpretation Date/Time:  Tuesday December 31 2022 10:52:37 EDT Ventricular Rate:  77 PR Interval:  190 QRS Duration:  102 QT Interval:  390 QTC Calculation: 441 R Axis:   -49  Text Interpretation: Sinus rhythm with occasional Premature ventricular complexes Left anterior fascicular block Poor R wave progression Nonspecific ST and T wave abnormality No acute changes Confirmed by Reather Littler [id number]) on 12/31/2022 11:11:25 AM   Cardiac Studies & Procedures   CARDIAC CATHETERIZATION  CARDIAC CATHETERIZATION 10/21/2018  Narrative  Mid RCA lesion is 40% stenosed.  Ost Cx lesion is 20% stenosed.  Mid Cx lesion is 20% stenosed.  Dist LAD lesion is 20% stenosed.  1. Mild non-obstructive CAD 2. Severe aortic stenosis (mean gradient 44.2 mmHg, peak to peak gradient 50 mmHg)  Recommendations: Will continue workup for TAVR.  Findings Coronary Findings Diagnostic  Dominance: Right  Left Anterior Descending Vessel is large. Dist LAD lesion is 20% stenosed.  Left Circumflex Vessel is large. Ost Cx lesion is 20% stenosed. Mid Cx lesion is 20% stenosed.  Right Coronary Artery Mid RCA lesion is 40% stenosed.  Intervention  No interventions have been documented.     ECHOCARDIOGRAM  ECHOCARDIOGRAM COMPLETE 11/18/2022  Narrative ECHOCARDIOGRAM REPORT    Patient Name:   [name]   Date of Exam: 11/18/2022 Medical Rec #:  [id number]     Height:  70.0 in Accession #:    [id number]    Weight:       253.8 lb Date of Birth:  [birth year]     BSA:          2.310 m Patient Age:    77 years      BP:           145/75 mmHg Patient Gender: M             HR:           73 bpm. Exam Location:  [address]  Procedure: 2D Echo, 3D Echo, Cardiac Doppler and Color Doppler  Indications:    I77.819 Dilation of  Aorta  History:        Patient has prior history of Echocardiogram examinations, most recent 11/29/2021. Stroke, Signs/Symptoms:Chest Pain and Palpitations; Risk Factors:Hypertension, HLD and Sleep Apnea. Aortic Valve: 26 mm Sapien prosthetic, stented (TAVR) valve is present in the aortic position. Procedure Date: 10/2018.  Sonographer:    Clearence Ped RCS Referring Phys: [id number] HEATHER E PEMBERTON  IMPRESSIONS   1. Left ventricular ejection fraction, by estimation, is 60 to 65%. The left ventricle has normal function. The left ventricle has no regional wall motion abnormalities. There is moderate concentric left ventricular hypertrophy. Left ventricular diastolic parameters are consistent with Grade I diastolic dysfunction (impaired relaxation). 2. Right ventricular systolic function is normal. The right ventricular size is normal. There is normal pulmonary artery systolic pressure. 3. The mitral valve is normal in structure. No evidence of mitral valve regurgitation. No evidence of mitral stenosis. 4. The aortic valve has been repaired/replaced. Paravalvular aortic valve regurgitation is mild. No aortic stenosis is present. There is a 26 mm Sapien prosthetic (TAVR) valve present in the aortic position. Mean gradient . Procedure Date: 10/2018. 5. Aortic dilatation noted. There is mild dilatation of the ascending aorta, measuring 40 mm. 6. The inferior vena cava is normal in size with greater than 50% respiratory variability, suggesting right atrial pressure of 3 mmHg.  FINDINGS Left Ventricle: Left ventricular ejection fraction, by estimation, is 60 to 65%. The left ventricle has normal function. The left ventricle has no regional wall motion abnormalities. The left ventricular internal cavity size was normal in size. There is moderate concentric left ventricular hypertrophy. Left ventricular diastolic parameters are consistent with Grade I diastolic dysfunction (impaired  relaxation).  Right Ventricle: The right ventricular size is normal. No increase in right ventricular wall thickness. Right ventricular systolic function is normal. There is normal pulmonary artery systolic pressure. The tricuspid regurgitant velocity is 1.34 m/s, and with an assumed right atrial pressure of 3 mmHg, the estimated right ventricular systolic pressure is 10.2 mmHg.  Left Atrium: Left atrial size was normal in size.  Right Atrium: Right atrial size was normal in size.  Pericardium: There is no evidence of pericardial effusion.  Mitral Valve: The mitral valve is normal in structure. No evidence of mitral valve regurgitation. No evidence of mitral valve stenosis.  Tricuspid Valve: The tricuspid valve is normal in structure. Tricuspid valve regurgitation is not demonstrated. No evidence of tricuspid stenosis.  Aortic Valve: The aortic valve has been repaired/replaced. Aortic valve regurgitation is mild. Aortic regurgitation PHT measures 690 msec. No aortic stenosis is present. Aortic valve mean gradient measures 17.0 mmHg. Aortic valve peak gradient measures 32.3 mmHg. Aortic valve area, by VTI measures 1.62 cm. There is a 26 mm Sapien prosthetic, stented (TAVR) valve present in the aortic position. Procedure Date: 10/2018.  Pulmonic  Valve: The pulmonic valve was normal in structure. Pulmonic valve regurgitation is trivial. No evidence of pulmonic stenosis.  Aorta: Aortic dilatation noted. There is mild dilatation of the ascending aorta, measuring 40 mm.  Venous: The inferior vena cava is normal in size with greater than 50% respiratory variability, suggesting right atrial pressure of 3 mmHg.  IAS/Shunts: No atrial level shunt detected by color flow Doppler.   LEFT VENTRICLE PLAX 2D LVIDd:         4.50 cm   Diastology LVIDs:         3.20 cm   LV e' medial:    6.42 cm/s LV PW:         1.50 cm   LV E/e' medial:  13.3 LV IVS:        1.50 cm   LV e' lateral:   7.07 cm/s LVOT  diam:     2.00 cm   LV E/e' lateral: 12.1 LV SV:         101 LV SV Index:   44 LVOT Area:     3.14 cm  3D Volume EF: 3D EF:        69 % LV EDV:       88 ml LV ESV:       27 ml LV SV:        61 ml  RIGHT VENTRICLE RV S prime:     17.50 cm/s TAPSE (M-mode): 1.4 cm RVSP:           10.2 mmHg  LEFT ATRIUM             Index        RIGHT ATRIUM           Index LA diam:        4.40 cm 1.90 cm/m   RA Pressure: 3.00 mmHg LA Vol (A2C):   47.7 ml 20.65 ml/m  RA Area:     9.41 cm LA Vol (A4C):   42.3 ml 18.31 ml/m  RA Volume:   16.00 ml  6.93 ml/m LA Biplane Vol: 45.1 ml 19.53 ml/m AORTIC VALVE AV Area (Vmax):    1.58 cm AV Area (Vmean):   1.63 cm AV Area (VTI):     1.62 cm AV Vmax:           284.00 cm/s AV Vmean:          195.000 cm/s AV VTI:            0.622 m AV Peak Grad:      32.3 mmHg AV Mean Grad:      17.0 mmHg LVOT Vmax:         143.00 cm/s LVOT Vmean:        101.000 cm/s LVOT VTI:          0.321 m LVOT/AV VTI ratio: 0.52 AI PHT:            690 msec  AORTA Ao Root diam: 3.50 cm Ao Asc diam:  4.00 cm  MITRAL VALVE                TRICUSPID VALVE MV Area (PHT):              TR Peak grad:   7.2 mmHg MV Decel Time:              TR Vmax:        134.00 cm/s MV E velocity: 85.40 cm/s   Estimated RAP:  3.00 mmHg  MV A velocity: 100.00 cm/s  RVSP:           10.2 mmHg MV E/A ratio:  0.85 SHUNTS Systemic VTI:  0.32 m Systemic Diam: 2.00 cm  Aditya Sabharwal Electronically signed by Dorthula Nettles Signature Date/Time: 11/18/2022/1:29:05 PM    Final   TEE  ECHO TEE 02/03/2018  Narrative *[place]* *[facility]* [address] [place], [place] [address] [phone]  ------------------------------------------------------------------- Transesophageal Echocardiography  Patient:    Musiq, Vest MR #:       [id number] Study Date: 02/03/2018 Gender:     M Age:        29 Height:     177.8 cm Weight:     117.5 kg BSA:        2.46 m^2 Pt.  Status: Room:       3W18C  PERFORMING   Olga Millers SONOGRAPHER  Delcie Roch, RDCS, CCT ATTENDING    Charolette Child, Wynema Birch [id number] ADMITTING    Cleatrice Burke M  cc:  ------------------------------------------------------------------- LV EF: 55% -   60%  ------------------------------------------------------------------- Indications:      CVA 436.  ------------------------------------------------------------------- Study Conclusions  - Left ventricle: Systolic function was normal. The estimated ejection fraction was in the range of 55% to 60%. Wall motion was normal; there were no regional wall motion abnormalities. - Aortic valve: Valve mobility was restricted. There was moderate stenosis. There was mild regurgitation. - Mitral valve: No evidence of vegetation. - Left atrium: The atrium was mildly dilated. No evidence of thrombus in the atrial cavity or appendage. No evidence of thrombus in the atrial cavity or appendage. - Right ventricle: The cavity size was normal. Wall thickness was increased. - Atrial septum: No defect or patent foramen ovale was identified. - Tricuspid valve: No evidence of vegetation. - Pulmonic valve: No evidence of vegetation. - Pericardium, extracardiac: A trivial pericardial effusion was identified.  Impressions:  - Normal LV function; mild LAE; calcified aortic valve with moderate AS (AVA by planimetry 1.1 cm2; mean gradient 36 mmHg/peak velocity 3.7 m/s by transthoracic imaging); mild AI; negative saline microcavitation study.  ------------------------------------------------------------------- Study data:   Study status:  Routine.  Consent:  The risks, benefits, and alternatives to the procedure were explained to the patient and informed consent was obtained.  Procedure:  Initial setup. The patient was brought to the laboratory. Surface ECG leads were monitored. Sedation. Conscious sedation was administered  by cardiology staff. Transesophageal echocardiography. An adult multiplane transesophageal probe was inserted by the attending cardiologistwithout difficulty. Image quality was adequate. Intravenous contrast (agitated saline) was administered.  Study completion:  The patient tolerated the procedure well. There were no complications.  Administered medications:   Fentanyl, , IV. Midazolam, 5mg , IV.          Diagnostic transesophageal echocardiography.  2D and color Doppler.  Birthdate:  Patient birthdate: 1945-10-16.  Age:  Patient is 77 yr old.  Sex:  Gender: male.    BMI: 37.2 kg/m^2.  Blood pressure:     185/95  Patient status:  Inpatient.  Study date:  Study date: 02/03/2018. Study time: 09:55 AM.  Location:  Endoscopy.  -------------------------------------------------------------------  ------------------------------------------------------------------- Left ventricle:  Systolic function was normal. The estimated ejection fraction was in the range of 55% to 60%. Wall motion was normal; there were no regional wall motion abnormalities.  ------------------------------------------------------------------- Aortic valve:   Trileaflet; moderately calcified leaflets. Valve mobility was restricted.  Doppler:   There was moderate stenosis.  There was mild regurgitation.    Mean gradient (S): 36 mm Hg. Peak gradient (S): 55 mm Hg.  ------------------------------------------------------------------- Aorta:  Descending aorta: The descending aorta had mild diffuse disease.  ------------------------------------------------------------------- Mitral valve:   Mildly thickened leaflets . Leaflet separation was normal.  No evidence of vegetation.  Doppler:  There was trivial regurgitation.  ------------------------------------------------------------------- Left atrium:  The atrium was mildly dilated.  No evidence of thrombus in the atrial cavity or appendage.  No evidence of thrombus in  the atrial cavity or appendage.  ------------------------------------------------------------------- Atrial septum:  No defect or patent foramen ovale was identified.  ------------------------------------------------------------------- Right ventricle:  The cavity size was normal. Wall thickness was increased. Systolic function was normal.  ------------------------------------------------------------------- Pulmonic valve:    Structurally normal valve.   Cusp separation was normal.  No evidence of vegetation.  Doppler:  There was trivial regurgitation.  ------------------------------------------------------------------- Tricuspid valve:   Structurally normal valve.   Leaflet separation was normal.  No evidence of vegetation.  Doppler:  There was trivial regurgitation.  ------------------------------------------------------------------- Right atrium:  The atrium was normal in size.  ------------------------------------------------------------------- Pericardium:  A trivial pericardial effusion was identified.  ------------------------------------------------------------------- Measurements  Aortic valve                       Value Aortic valve peak velocity, S      370   cm/s Aortic valve mean velocity, S      288   cm/s Aortic valve VTI, S                71    cm Aortic mean gradient, S            36    mm Hg Aortic peak gradient, S            55    mm Hg  Legend: (L)  and  (H)  mark values outside specified reference range.  ------------------------------------------------------------------- Prepared and Electronically Authenticated by  Olga Millers 2019-10-01T11:36:27   MONITORS  CARDIAC EVENT MONITOR 02/10/2018  Narrative  Sinus bradycardia to sinus tachycardia.  Infrequent PVCs.  No evidence for atrial fibrillation.   CT SCANS  CT CORONARY MORPH W/CTA COR W/SCORE 10/23/2018  Addendum 10/23/2018  3:59 PM ADDENDUM REPORT: 10/23/2018 15:57  CLINICAL  DATA:  Aortic stenosis  EXAM: Cardiac TAVR CT  TECHNIQUE: The patient was scanned on a Siemens Force 192 slice scanner. A 120 kV retrospective scan was triggered in the descending thoracic aorta at 111 HU's. Gantry rotation speed was 270 msecs and collimation was .9 mm. No beta blockade or nitro were given. The 3D data set was reconstructed in 5% intervals of the R-R cycle. Systolic and diastolic phases were analyzed on a dedicated work station using MPR, MIP and VRT modes. The patient received OMNIPAQUE IOHEXOL 350 MG/ML SOLN of contrast.  FINDINGS: Aortic Valve: Trileaflet aortic valve. Severe leaflet calcifications, moderately leaflet thickening, severely reduced leaflet excursion  AV calcium score: Noncontrast images not performed, unable to calculate AV calcium score.  Aorta: Conventional 3 vessel branch pattern of aortic arch. Trivial mixed atherosclerotic plaque in the aortic arch. No significant plaque or calcifications in the ascending or descending thoracic aorta.  Sinus of Valsalva Measurements:  Non-coronary: 31 mm  Right - coronary: 31 mm  Left - coronary: 31 mm  Sinotubular Junction: 30 mm  Mid ascending Thoracic Aorta (level of PA bifurcation): 37 mm  Aortic Arch: 30 mm  Mid descending Thoracic Aorta (  at level of pulmonary veins): 26 mm  Sinus of Valsalva Height:  Left: 17 mm  Right: 16 mm  Coronary Artery Height above Annulus:  Left Main: 11 mm  Right Coronary: 11 mm  Virtual Basal Annulus Measurements:  Maximum/Minimum Diameter: 26.4 mm x 24.8 mm  Perimeter: 79.5 mm  Area: 490 mm2  Based on these measurements, the annulus may be best suited for a 26 mm Sapien 3 valve  Coronary Arteries: Normal origin and course. Calcifications in the proximal LAD and proximal circumflex.  Optimum Fluoroscopic Angle for Delivery: LAO 15, CAU 10  IMPRESSION: 1. Trileaflet aortic valve. Severe leaflet calcifications, moderately leaflet  thickening, severely reduced leaflet excursion  2. Normal caliber thoracic aorta with trivial aortic arch calcifications.  3.  Adequate coronary heights, LM 11 mm, RCA 11 mm  4. Annulus area 490 mm2. Based on these measurements, the annulus may be best suited for a 26 mm Sapien 3 valve.  5.  Optimum Fluoroscopic Angle for Delivery: LAO 15, CAU 10   Electronically Signed By: Weston Brass On: 10/23/2018 15:57  Narrative EXAM: OVER-READ INTERPRETATION  CT CHEST  The following report is an over-read performed by radiologist Dr. Cleone Slim of [facility] on 10/23/2018. This over-read does not include interpretation of cardiac or coronary anatomy or pathology. The cardiac CTA interpretation by the cardiologist is attached.  COMPARISON:  None.  FINDINGS: Please see the separate concurrent chest CT angiogram report for details.  IMPRESSION: Please see the separate concurrent chest CT angiogram report for details.  Electronically Signed: By: Delbert Phenix M.D. On: 10/23/2018 11:23                   Physical Exam:   VS:  BP 110/60   Pulse 77   Ht 5\' 10"  (1.778 m)   Wt 244 lb 12.8 oz (111 kg)   SpO2 96%   BMI 35.13 kg/m    Wt Readings from Last 3 Encounters:  12/31/22 244 lb 12.8 oz (111 kg)  06/20/22 253 lb 12.8 oz (115.1 kg)  11/09/21 252 lb 3.2 oz (114.4 kg)    GEN: Well nourished, well developed in no acute distress NECK: No JVD; No carotid bruits CARDIAC: RRR, 2/6 systolic murmur, no rubs, gallops RESPIRATORY:  Clear to auscultation without rales, wheezing or rhonchi  ABDOMEN: Soft, non-tender, non-distended EXTREMITIES:  No edema; No deformity   ASSESSMENT AND PLAN: .    Severe AS s/p TAVR: Stable on echo in 11/2022 with LVEF of 60 to 65%, well seated TAVR with mild paravalvular leak. He denies anginal symptoms today. Plan to continue serial monitoring with TTE. Reviewed SBE prophylaxis.   Mild to Moderate CAD: Noted on LHC in 2020. Stable  with no anginal symptoms. No indication for ischemic evaluation. EKG today shows sinus rhythm with occasional PVCs, LAFB, unchanged from prior EKGs. Heart healthy diet and regular cardiovascular exercise encouraged.  Continue asprin, crestor, amlodipine, coreg and losartan.   HTN/History of orthostasis: Blood pressure well controlled today at 110/60. Notes very rare orthostatic symptoms, reports significant improvement. Continue amlodipine, hydrochlorothiazide, spironolactone, losartan and coreg.   HLD: Last lipid profile on 08/28/22 indicated total cholesterol of 135 and LDL of 54. Continue rosuvastatin 20 mg daily.   Mild ascenidng aorta dilatation: Noted as stable at 40 mm on last echo in 11/2022. He undergoes yearly echos for monitoring.        Dispo: Follow up with Dr. Clifton Earon in 6 months.   Signed, Charlesetta Garibaldi  Armando Gang, NP

## 2022-12-31 ENCOUNTER — Encounter: Payer: Self-pay | Admitting: Cardiology

## 2022-12-31 ENCOUNTER — Ambulatory Visit: Payer: Medicare PPO | Attending: Cardiology | Admitting: Cardiology

## 2022-12-31 VITALS — BP 110/60 | HR 77 | Ht 70.0 in | Wt 244.8 lb

## 2022-12-31 DIAGNOSIS — I951 Orthostatic hypotension: Secondary | ICD-10-CM | POA: Diagnosis not present

## 2022-12-31 DIAGNOSIS — I77819 Aortic ectasia, unspecified site: Secondary | ICD-10-CM | POA: Diagnosis not present

## 2022-12-31 DIAGNOSIS — I2510 Atherosclerotic heart disease of native coronary artery without angina pectoris: Secondary | ICD-10-CM | POA: Diagnosis not present

## 2022-12-31 DIAGNOSIS — Z952 Presence of prosthetic heart valve: Secondary | ICD-10-CM | POA: Diagnosis not present

## 2022-12-31 DIAGNOSIS — I350 Nonrheumatic aortic (valve) stenosis: Secondary | ICD-10-CM | POA: Diagnosis not present

## 2022-12-31 DIAGNOSIS — I10 Essential (primary) hypertension: Secondary | ICD-10-CM | POA: Diagnosis not present

## 2022-12-31 NOTE — Patient Instructions (Signed)
Medication Instructions:  Your physician recommends that you continue on your current medications as directed. Please refer to the Current Medication list given to you today. n *If you need a refill on your cardiac medications before your next appointment, please call your pharmacy*   Lab Work: None ordered If you have labs (blood work) drawn today and your tests are completely normal, you will receive your results only by: MyChart Message (if you have MyChart) OR A paper copy in the mail If you have any lab test that is abnormal or we need to change your treatment, we will call you to review the results.   Testing/Procedures: None ordered   Follow-Up: At [facility], you and your health needs are our priority.  As part of our continuing mission to provide you with exceptional heart care, we have created designated Provider Care Teams.  These Care Teams include your primary Cardiologist (physician) and Advanced Practice Providers (APPs -  Physician Assistants and Nurse Practitioners) who all work together to provide you with the care you need, when you need it.  We recommend signing up for the patient portal called "MyChart".  Sign up information is provided on this After Visit Summary.  MyChart is used to connect with patients for Virtual Visits (Telemedicine).  Patients are able to view lab/test results, encounter notes, upcoming appointments, etc.  Non-urgent messages can be sent to your provider as well.   To learn more about what you can do with MyChart, go to [email or web address].    Your next appointment:   6 month(s)  Provider:   Verne Carrow, MD     Other Instructions

## 2023-01-03 DIAGNOSIS — G4733 Obstructive sleep apnea (adult) (pediatric): Secondary | ICD-10-CM | POA: Diagnosis not present

## 2023-01-08 ENCOUNTER — Other Ambulatory Visit: Payer: Self-pay | Admitting: *Deleted

## 2023-01-08 DIAGNOSIS — I2510 Atherosclerotic heart disease of native coronary artery without angina pectoris: Secondary | ICD-10-CM

## 2023-01-08 DIAGNOSIS — E785 Hyperlipidemia, unspecified: Secondary | ICD-10-CM

## 2023-01-08 DIAGNOSIS — Z952 Presence of prosthetic heart valve: Secondary | ICD-10-CM

## 2023-01-08 DIAGNOSIS — I10 Essential (primary) hypertension: Secondary | ICD-10-CM

## 2023-01-08 DIAGNOSIS — E782 Mixed hyperlipidemia: Secondary | ICD-10-CM

## 2023-01-08 MED ORDER — ROSUVASTATIN CALCIUM 20 MG PO TABS
20.0000 mg | ORAL_TABLET | Freq: Every day | ORAL | 3 refills | Status: DC
Start: 2023-01-08 — End: 2023-07-31

## 2023-01-21 DIAGNOSIS — H6991 Unspecified Eustachian tube disorder, right ear: Secondary | ICD-10-CM | POA: Diagnosis not present

## 2023-02-03 DIAGNOSIS — G4733 Obstructive sleep apnea (adult) (pediatric): Secondary | ICD-10-CM | POA: Diagnosis not present

## 2023-02-10 ENCOUNTER — Other Ambulatory Visit: Payer: Self-pay

## 2023-02-10 DIAGNOSIS — E785 Hyperlipidemia, unspecified: Secondary | ICD-10-CM

## 2023-02-10 DIAGNOSIS — E782 Mixed hyperlipidemia: Secondary | ICD-10-CM

## 2023-02-10 DIAGNOSIS — I10 Essential (primary) hypertension: Secondary | ICD-10-CM

## 2023-02-10 DIAGNOSIS — I2510 Atherosclerotic heart disease of native coronary artery without angina pectoris: Secondary | ICD-10-CM

## 2023-02-10 DIAGNOSIS — Z952 Presence of prosthetic heart valve: Secondary | ICD-10-CM

## 2023-02-10 MED ORDER — SPIRONOLACTONE 25 MG PO TABS
25.0000 mg | ORAL_TABLET | Freq: Every day | ORAL | 3 refills | Status: DC
Start: 2023-02-10 — End: 2024-02-03

## 2023-02-20 NOTE — Telephone Encounter (Signed)
[name], [name], LPN [place],  I sent a message to [name] regarding scheduling. [name] doesn't have anything available until April and that schedule is not available yet. Beings that he is seeing [name] for structural heart I am waiting to hear back to see if he can be worked in with [name] or follow up with one of the structural APP's.  Thanks!    Will forward this message to [name] RN, to see if she would be able to get the pt an appt for Feb 2025.  He was referred to establish with [name] as his primary Cards, due to history of s/p TAVR.  He could also probably see Structural APP if [name] has nothing.

## 2023-02-21 ENCOUNTER — Other Ambulatory Visit: Payer: Self-pay

## 2023-02-21 DIAGNOSIS — I10 Essential (primary) hypertension: Secondary | ICD-10-CM

## 2023-02-21 DIAGNOSIS — E785 Hyperlipidemia, unspecified: Secondary | ICD-10-CM

## 2023-02-21 DIAGNOSIS — E782 Mixed hyperlipidemia: Secondary | ICD-10-CM

## 2023-02-21 DIAGNOSIS — K6289 Other specified diseases of anus and rectum: Secondary | ICD-10-CM | POA: Diagnosis not present

## 2023-02-21 DIAGNOSIS — G4733 Obstructive sleep apnea (adult) (pediatric): Secondary | ICD-10-CM | POA: Diagnosis not present

## 2023-02-21 DIAGNOSIS — Z952 Presence of prosthetic heart valve: Secondary | ICD-10-CM

## 2023-02-21 DIAGNOSIS — E1169 Type 2 diabetes mellitus with other specified complication: Secondary | ICD-10-CM | POA: Diagnosis not present

## 2023-02-21 DIAGNOSIS — E1142 Type 2 diabetes mellitus with diabetic polyneuropathy: Secondary | ICD-10-CM | POA: Diagnosis not present

## 2023-02-21 DIAGNOSIS — I2510 Atherosclerotic heart disease of native coronary artery without angina pectoris: Secondary | ICD-10-CM

## 2023-02-21 MED ORDER — CARVEDILOL 3.125 MG PO TABS
3.1250 mg | ORAL_TABLET | Freq: Two times a day (BID) | ORAL | 3 refills | Status: DC
Start: 2023-02-21 — End: 2024-01-29

## 2023-03-04 ENCOUNTER — Other Ambulatory Visit: Payer: Self-pay | Admitting: *Deleted

## 2023-03-04 DIAGNOSIS — E782 Mixed hyperlipidemia: Secondary | ICD-10-CM

## 2023-03-04 DIAGNOSIS — Z952 Presence of prosthetic heart valve: Secondary | ICD-10-CM

## 2023-03-04 DIAGNOSIS — I2510 Atherosclerotic heart disease of native coronary artery without angina pectoris: Secondary | ICD-10-CM

## 2023-03-04 DIAGNOSIS — E785 Hyperlipidemia, unspecified: Secondary | ICD-10-CM

## 2023-03-04 DIAGNOSIS — I10 Essential (primary) hypertension: Secondary | ICD-10-CM

## 2023-03-04 MED ORDER — AMLODIPINE BESYLATE 5 MG PO TABS
5.0000 mg | ORAL_TABLET | Freq: Every day | ORAL | 2 refills | Status: DC
Start: 2023-03-04 — End: 2023-11-17

## 2023-03-05 DIAGNOSIS — G4733 Obstructive sleep apnea (adult) (pediatric): Secondary | ICD-10-CM | POA: Diagnosis not present

## 2023-03-11 DIAGNOSIS — G4733 Obstructive sleep apnea (adult) (pediatric): Secondary | ICD-10-CM | POA: Diagnosis not present

## 2023-03-11 DIAGNOSIS — E1142 Type 2 diabetes mellitus with diabetic polyneuropathy: Secondary | ICD-10-CM | POA: Diagnosis not present

## 2023-03-11 DIAGNOSIS — I2510 Atherosclerotic heart disease of native coronary artery without angina pectoris: Secondary | ICD-10-CM | POA: Diagnosis not present

## 2023-03-11 DIAGNOSIS — N400 Enlarged prostate without lower urinary tract symptoms: Secondary | ICD-10-CM | POA: Diagnosis not present

## 2023-03-11 DIAGNOSIS — E1169 Type 2 diabetes mellitus with other specified complication: Secondary | ICD-10-CM | POA: Diagnosis not present

## 2023-03-11 DIAGNOSIS — I69320 Aphasia following cerebral infarction: Secondary | ICD-10-CM | POA: Diagnosis not present

## 2023-03-11 DIAGNOSIS — I10 Essential (primary) hypertension: Secondary | ICD-10-CM | POA: Diagnosis not present

## 2023-03-11 DIAGNOSIS — M545 Low back pain, unspecified: Secondary | ICD-10-CM | POA: Diagnosis not present

## 2023-03-11 DIAGNOSIS — K6289 Other specified diseases of anus and rectum: Secondary | ICD-10-CM | POA: Diagnosis not present

## 2023-03-12 DIAGNOSIS — Z9181 History of falling: Secondary | ICD-10-CM | POA: Diagnosis not present

## 2023-03-12 DIAGNOSIS — Z1331 Encounter for screening for depression: Secondary | ICD-10-CM | POA: Diagnosis not present

## 2023-03-12 DIAGNOSIS — Z0000 Encounter for general adult medical examination without abnormal findings: Secondary | ICD-10-CM | POA: Diagnosis not present

## 2023-03-12 DIAGNOSIS — Z139 Encounter for screening, unspecified: Secondary | ICD-10-CM | POA: Diagnosis not present

## 2023-04-05 DIAGNOSIS — G4733 Obstructive sleep apnea (adult) (pediatric): Secondary | ICD-10-CM | POA: Diagnosis not present

## 2023-05-05 DIAGNOSIS — G4733 Obstructive sleep apnea (adult) (pediatric): Secondary | ICD-10-CM | POA: Diagnosis not present

## 2023-05-28 ENCOUNTER — Other Ambulatory Visit: Payer: Self-pay

## 2023-05-28 MED ORDER — AMOXICILLIN 500 MG PO TABS: ORAL_TABLET | ORAL | 2 refills | Status: AC

## 2023-06-01 DIAGNOSIS — G4733 Obstructive sleep apnea (adult) (pediatric): Secondary | ICD-10-CM | POA: Diagnosis not present

## 2023-06-05 DIAGNOSIS — G4733 Obstructive sleep apnea (adult) (pediatric): Secondary | ICD-10-CM | POA: Diagnosis not present

## 2023-06-17 DIAGNOSIS — I10 Essential (primary) hypertension: Secondary | ICD-10-CM | POA: Diagnosis not present

## 2023-06-17 DIAGNOSIS — E1169 Type 2 diabetes mellitus with other specified complication: Secondary | ICD-10-CM | POA: Diagnosis not present

## 2023-06-17 DIAGNOSIS — N400 Enlarged prostate without lower urinary tract symptoms: Secondary | ICD-10-CM | POA: Diagnosis not present

## 2023-06-17 DIAGNOSIS — I69320 Aphasia following cerebral infarction: Secondary | ICD-10-CM | POA: Diagnosis not present

## 2023-06-17 DIAGNOSIS — I2510 Atherosclerotic heart disease of native coronary artery without angina pectoris: Secondary | ICD-10-CM | POA: Diagnosis not present

## 2023-06-17 DIAGNOSIS — G4733 Obstructive sleep apnea (adult) (pediatric): Secondary | ICD-10-CM | POA: Diagnosis not present

## 2023-06-17 DIAGNOSIS — K6289 Other specified diseases of anus and rectum: Secondary | ICD-10-CM | POA: Diagnosis not present

## 2023-06-30 DIAGNOSIS — D485 Neoplasm of uncertain behavior of skin: Secondary | ICD-10-CM | POA: Diagnosis not present

## 2023-06-30 DIAGNOSIS — L812 Freckles: Secondary | ICD-10-CM | POA: Diagnosis not present

## 2023-06-30 DIAGNOSIS — L821 Other seborrheic keratosis: Secondary | ICD-10-CM | POA: Diagnosis not present

## 2023-06-30 DIAGNOSIS — L570 Actinic keratosis: Secondary | ICD-10-CM | POA: Diagnosis not present

## 2023-06-30 DIAGNOSIS — C44319 Basal cell carcinoma of skin of other parts of face: Secondary | ICD-10-CM | POA: Diagnosis not present

## 2023-06-30 DIAGNOSIS — C4441 Basal cell carcinoma of skin of scalp and neck: Secondary | ICD-10-CM | POA: Diagnosis not present

## 2023-06-30 DIAGNOSIS — D225 Melanocytic nevi of trunk: Secondary | ICD-10-CM | POA: Diagnosis not present

## 2023-06-30 DIAGNOSIS — Z85828 Personal history of other malignant neoplasm of skin: Secondary | ICD-10-CM | POA: Diagnosis not present

## 2023-07-04 DIAGNOSIS — G4733 Obstructive sleep apnea (adult) (pediatric): Secondary | ICD-10-CM | POA: Diagnosis not present

## 2023-07-08 NOTE — Progress Notes (Unsigned)
 [facility]   MULTIDISCIPLINARY HEART VALVE CLINIC                                     Cardiology Office Note:    Date:  07/09/2023   ID:  [name], DOB 08-01-1945, MRN [id number]  PCP:  Eugene Peak, PA-C  [facility] Cardiologist:  Eugene Carrow, MD --> previously Dr. Delton Anderson and [name]'s pt [facility] Electrophysiologist:  None   Referring MD: Eugene Peak, PA-C   Follow up  History of Present Illness:    [name] is a 78 y.o. male with a hx of severe AS s/p TAVR in 10/2018, prior CVA, HTN, HLD, OSA on CPAP and seizures who presents to clinic for follow up.  He underwent successful TAVR with a 26 mm Edwards Sapien 3 THV via the TF approach on 11/03/18. Pre op cath showed mild to moderate non obst CAD. He has been followed with echos on a yearly basis. Most recent echo 11/18/2022 showed EF 60%, mod LVH, normally functioning TAVR with a mean gradient of 17 mmHg and mild PVL as well as a mildly dilated aorta ~7mm hg. He has had issues with orthostasis that improved with reducing tamsulosin and hydrochlorothiazide.  Today the patient presents to clinic for follow up. No CP or SOB. No LE edema, orthopnea or PND. He has occasional dizziness while walking but no syncope. No blood in stool or urine. No palpitations.  Labs were reviewed which revealed a hemoglobin A1c of 6.4 in 06/2023.  Labs done in 03/2023 showed a creatinine of 1.1, hemoglobin 13.5, LDL 42.  TSH 0.836, potassium 4.4.  These were all personally reviewed.    Past Medical History:  Diagnosis Date   Aortic stenosis, severe    Arthritis    "hands" (02/04/2018)   Basal cell carcinoma    "MOHS surgery on sides of my nose" (02/04/2018)   Chronic lower back pain    CVA (cerebral vascular accident) (HCC) 01/29/2018   CVA (cerebral vascular accident) (HCC) 02/01/2018   Diarrhea    Difficulty urinating    Heart murmur    no problems   High cholesterol    Hypertension    OSA on CPAP     "just got CPAP today" (02/04/2018)   Rectal bleeding    Rectal pain    S/P TAVR (transcatheter aortic valve replacement) 11/03/2018   26 mm Edwards Sapien 3 transcatheter heart valve placed via percutaneous right transfemoral approach    Stroke (HCC)    "we were told that MRI showed previous stroke we were not aware that he had" (02/04/2018)     Current Medications: Current Meds  Medication Sig   acetaminophen (TYLENOL) 650 MG CR tablet Take 650 mg by mouth every 8 (eight) hours as needed.   amLODipine (NORVASC) 5 MG tablet Take 1 tablet (5 mg total) by mouth daily.   ammonium lactate (AMLACTIN) 12 % cream Apply 1 Application topically as needed for dry skin.   aspirin EC 81 MG EC tablet Take 1 tablet (81 mg total) by mouth daily.   carvedilol (COREG) 3.125 MG tablet Take 1 tablet (3.125 mg total) by mouth 2 (two) times daily.   dutasteride (AVODART) 0.5 MG capsule Take 0.5 mg by mouth daily.   FARXIGA 10 MG TABS tablet Take 10 mg by mouth daily.   gabapentin (NEURONTIN) 100 MG capsule Take 100 mg  by mouth at bedtime.   hydrochlorothiazide (HYDRODIURIL) 25 MG tablet Take 0.5 tablets (12.5 mg total) by mouth daily.   losartan (COZAAR) 100 MG tablet Take 1 tablet (100 mg total) by mouth daily.   Metformin HCl 500 MG/5ML SOLN Take 500 mg by mouth at bedtime.   rosuvastatin (CRESTOR) 20 MG tablet Take 1 tablet (20 mg total) by mouth daily.   spironolactone (ALDACTONE) 25 MG tablet Take 1 tablet (25 mg total) by mouth daily.   tamsulosin (FLOMAX) 0.4 MG CAPS capsule Take 0.4 mg by mouth daily.      ROS:   Please Anderson the history of present illness.    All other systems reviewed and are negative.  EKGs       Risk Assessment/Calculations:           Physical Exam:    VS:  BP (!) 110/50   Pulse 68   Ht 5\' 10"  (1.778 m)   Wt 247 lb (112 kg)   BMI 35.44 kg/m     Wt Readings from Last 3 Encounters:  07/09/23 247 lb (112 kg)  12/31/22 244 lb 12.8 oz (111 kg)  06/20/22 253 lb  12.8 oz (115.1 kg)     GEN: Well nourished, well developed in no acute distress NECK: No JVD CARDIAC: RRR, no murmurs, rubs, gallops RESPIRATORY:  Clear to auscultation without rales, wheezing or rhonchi  ABDOMEN: Soft, non-tender, non-distended EXTREMITIES:  No edema; No deformity.  ASSESSMENT:    1. S/P TAVR (transcatheter aortic valve replacement)   2. Coronary artery disease involving native coronary artery of native heart without angina pectoris   3. Essential hypertension   4. Mixed hyperlipidemia   5. Dilation of aorta (HCC)     PLAN:    In order of problems listed above:  Severe AS s/p TAVR: -- Stable on TTE 11/2022 with LVEF 60-65%, well seated TAVR valve with mild paravalvular leak, mean gradient .  -- Due for repeat echo in 11/2023 -- Continue IE ppx prior to dental procedures   Mild-to-moderate CAD without angina: -- Continue ASA 81mg  daily -- Continue Crestor 20mg  daily  HTN: -- BP well controlled.  -- Continue amlodipine 5mg  daily -- Continue HCTZ 12.5mg  daily -- Continue spironolactone 25mg  daily -- Continue losartan 100mg  daily -- Continue coreg 3.125mg  BID -- Labs Labs done in 03/2023 showed a creatinine of 1.1 and K 4.4 (personally reviewed).  HLD: -- LDL cholesterol 42 on 03/2023 (personally reviewed). -- Continue crestor 20mg  daily   Mild Ascending Aorta Dilation: -- Measures 42mm on TTE 11/03/19, however appeared normal in 11/2020. CTA in 2020 normal. -- No further monitoring needed as appeared normal on TTE in 11/2020 and CTA chest in 2020  Medication Adjustments/Labs and Tests Ordered: Current medicines are reviewed at length with the patient today.  Concerns regarding medicines are outlined above.  Orders Placed This Encounter  Procedures   ECHOCARDIOGRAM COMPLETE   No orders of the defined types were placed in this encounter.   Patient Instructions  Medication Instructions:  No changes *If you need a refill on your cardiac  medications before your next appointment, please call your pharmacy*   Lab Work: none If you have labs (blood work) drawn today and your tests are completely normal, you will receive your results only by: MyChart Message (if you have MyChart) OR A paper copy in the mail If you have any lab test that is abnormal or we need to change your treatment, we will call you  to review the results.   Testing/Procedures: Echo as planned in July 2025   Follow-Up: July after echo with Dr. Clifton Daekwon         Signed, Cline Crock, PA-C  07/09/2023 4:41 PM    [facility]

## 2023-07-09 ENCOUNTER — Ambulatory Visit: Payer: Medicare PPO | Attending: Physician Assistant | Admitting: Physician Assistant

## 2023-07-09 VITALS — BP 110/50 | HR 68 | Ht 70.0 in | Wt 247.0 lb

## 2023-07-09 DIAGNOSIS — Z952 Presence of prosthetic heart valve: Secondary | ICD-10-CM | POA: Diagnosis not present

## 2023-07-09 DIAGNOSIS — I10 Essential (primary) hypertension: Secondary | ICD-10-CM | POA: Diagnosis not present

## 2023-07-09 DIAGNOSIS — I2510 Atherosclerotic heart disease of native coronary artery without angina pectoris: Secondary | ICD-10-CM | POA: Diagnosis not present

## 2023-07-09 DIAGNOSIS — E782 Mixed hyperlipidemia: Secondary | ICD-10-CM

## 2023-07-09 DIAGNOSIS — I77819 Aortic ectasia, unspecified site: Secondary | ICD-10-CM | POA: Diagnosis not present

## 2023-07-09 NOTE — Patient Instructions (Addendum)
 Medication Instructions:  No changes *If you need a refill on your cardiac medications before your next appointment, please call your pharmacy*   Lab Work: none If you have labs (blood work) drawn today and your tests are completely normal, you will receive your results only by: MyChart Message (if you have MyChart) OR A paper copy in the mail If you have any lab test that is abnormal or we need to change your treatment, we will call you to review the results.   Testing/Procedures: Echo as planned in July 2025   Follow-Up: July after echo with Dr. Clifton Jassiah

## 2023-07-31 ENCOUNTER — Other Ambulatory Visit: Payer: Self-pay

## 2023-07-31 DIAGNOSIS — I10 Essential (primary) hypertension: Secondary | ICD-10-CM

## 2023-07-31 DIAGNOSIS — E782 Mixed hyperlipidemia: Secondary | ICD-10-CM

## 2023-07-31 DIAGNOSIS — E785 Hyperlipidemia, unspecified: Secondary | ICD-10-CM

## 2023-07-31 DIAGNOSIS — Z952 Presence of prosthetic heart valve: Secondary | ICD-10-CM

## 2023-07-31 DIAGNOSIS — I2510 Atherosclerotic heart disease of native coronary artery without angina pectoris: Secondary | ICD-10-CM

## 2023-07-31 MED ORDER — ROSUVASTATIN CALCIUM 20 MG PO TABS: 20.0000 mg | ORAL_TABLET | Freq: Every day | ORAL | 3 refills | Status: AC

## 2023-08-03 DIAGNOSIS — G4733 Obstructive sleep apnea (adult) (pediatric): Secondary | ICD-10-CM | POA: Diagnosis not present

## 2023-08-07 DIAGNOSIS — Z85828 Personal history of other malignant neoplasm of skin: Secondary | ICD-10-CM | POA: Diagnosis not present

## 2023-08-07 DIAGNOSIS — C44319 Basal cell carcinoma of skin of other parts of face: Secondary | ICD-10-CM | POA: Diagnosis not present

## 2023-08-08 ENCOUNTER — Other Ambulatory Visit: Payer: Self-pay

## 2023-08-08 MED ORDER — LOSARTAN POTASSIUM 100 MG PO TABS: 100.0000 mg | ORAL_TABLET | Freq: Every day | ORAL | 3 refills | Status: AC

## 2023-09-03 DIAGNOSIS — G4733 Obstructive sleep apnea (adult) (pediatric): Secondary | ICD-10-CM | POA: Diagnosis not present

## 2023-09-04 ENCOUNTER — Encounter: Payer: Self-pay | Admitting: Cardiovascular Disease

## 2023-09-19 DIAGNOSIS — I69320 Aphasia following cerebral infarction: Secondary | ICD-10-CM | POA: Diagnosis not present

## 2023-09-19 DIAGNOSIS — Z125 Encounter for screening for malignant neoplasm of prostate: Secondary | ICD-10-CM | POA: Diagnosis not present

## 2023-09-19 DIAGNOSIS — E1169 Type 2 diabetes mellitus with other specified complication: Secondary | ICD-10-CM | POA: Diagnosis not present

## 2023-09-19 DIAGNOSIS — M545 Low back pain, unspecified: Secondary | ICD-10-CM | POA: Diagnosis not present

## 2023-09-19 DIAGNOSIS — K6289 Other specified diseases of anus and rectum: Secondary | ICD-10-CM | POA: Diagnosis not present

## 2023-09-19 DIAGNOSIS — E1142 Type 2 diabetes mellitus with diabetic polyneuropathy: Secondary | ICD-10-CM | POA: Diagnosis not present

## 2023-09-19 DIAGNOSIS — G4733 Obstructive sleep apnea (adult) (pediatric): Secondary | ICD-10-CM | POA: Diagnosis not present

## 2023-09-19 DIAGNOSIS — I10 Essential (primary) hypertension: Secondary | ICD-10-CM | POA: Diagnosis not present

## 2023-09-19 DIAGNOSIS — N400 Enlarged prostate without lower urinary tract symptoms: Secondary | ICD-10-CM | POA: Diagnosis not present

## 2023-09-19 DIAGNOSIS — I2510 Atherosclerotic heart disease of native coronary artery without angina pectoris: Secondary | ICD-10-CM | POA: Diagnosis not present

## 2023-10-03 DIAGNOSIS — G4733 Obstructive sleep apnea (adult) (pediatric): Secondary | ICD-10-CM | POA: Diagnosis not present

## 2023-11-03 DIAGNOSIS — G4733 Obstructive sleep apnea (adult) (pediatric): Secondary | ICD-10-CM | POA: Diagnosis not present

## 2023-11-15 ENCOUNTER — Other Ambulatory Visit: Payer: Self-pay | Admitting: Cardiovascular Disease

## 2023-11-15 DIAGNOSIS — I10 Essential (primary) hypertension: Secondary | ICD-10-CM

## 2023-11-15 DIAGNOSIS — E785 Hyperlipidemia, unspecified: Secondary | ICD-10-CM

## 2023-11-15 DIAGNOSIS — Z952 Presence of prosthetic heart valve: Secondary | ICD-10-CM

## 2023-11-15 DIAGNOSIS — I2510 Atherosclerotic heart disease of native coronary artery without angina pectoris: Secondary | ICD-10-CM

## 2023-11-15 DIAGNOSIS — E782 Mixed hyperlipidemia: Secondary | ICD-10-CM

## 2023-11-18 ENCOUNTER — Ambulatory Visit (HOSPITAL_COMMUNITY)
Admission: RE | Admit: 2023-11-18 | Discharge: 2023-11-18 | Disposition: A | Discharge status: Home / Self Care | Source: Ambulatory Visit | Attending: Internal Medicine | Admitting: Internal Medicine

## 2023-11-18 DIAGNOSIS — Z952 Presence of prosthetic heart valve: Secondary | ICD-10-CM

## 2023-11-19 LAB — ECHOCARDIOGRAM COMPLETE
AR max vel: 1.94 cm2
AV Area VTI: 2.14 cm2
AV Area mean vel: 1.98 cm2
AV Mean grad: 16 mmHg
AV Peak grad: 30.8 mmHg
Ao pk vel: 2.78 m/s
Area-P 1/2: 3.13 cm2
P 1/2 time: 403 ms
S' Lateral: 3 cm

## 2023-11-20 ENCOUNTER — Ambulatory Visit: Payer: Self-pay | Admitting: Physician Assistant

## 2023-11-20 NOTE — Telephone Encounter (Addendum)
 Spoke with patient, relayed results and follow up appt. Patient understands and has no further questions.

## 2023-11-25 DIAGNOSIS — E119 Type 2 diabetes mellitus without complications: Secondary | ICD-10-CM | POA: Diagnosis not present

## 2023-11-29 DIAGNOSIS — G4733 Obstructive sleep apnea (adult) (pediatric): Secondary | ICD-10-CM | POA: Diagnosis not present

## 2023-12-03 DIAGNOSIS — G4733 Obstructive sleep apnea (adult) (pediatric): Secondary | ICD-10-CM | POA: Diagnosis not present

## 2023-12-23 ENCOUNTER — Other Ambulatory Visit: Payer: Self-pay | Admitting: Physician Assistant

## 2023-12-25 ENCOUNTER — Other Ambulatory Visit: Payer: Self-pay | Admitting: Physician Assistant

## 2023-12-25 MED ORDER — HYDROCHLOROTHIAZIDE 25 MG PO TABS: 12.5000 mg | ORAL_TABLET | Freq: Every day | ORAL | 2 refills | Status: AC

## 2023-12-26 DIAGNOSIS — I69320 Aphasia following cerebral infarction: Secondary | ICD-10-CM | POA: Diagnosis not present

## 2023-12-26 DIAGNOSIS — N400 Enlarged prostate without lower urinary tract symptoms: Secondary | ICD-10-CM | POA: Diagnosis not present

## 2023-12-26 DIAGNOSIS — G4733 Obstructive sleep apnea (adult) (pediatric): Secondary | ICD-10-CM | POA: Diagnosis not present

## 2023-12-26 DIAGNOSIS — I10 Essential (primary) hypertension: Secondary | ICD-10-CM | POA: Diagnosis not present

## 2023-12-26 DIAGNOSIS — K6289 Other specified diseases of anus and rectum: Secondary | ICD-10-CM | POA: Diagnosis not present

## 2023-12-26 DIAGNOSIS — I2510 Atherosclerotic heart disease of native coronary artery without angina pectoris: Secondary | ICD-10-CM | POA: Diagnosis not present

## 2023-12-26 DIAGNOSIS — E1169 Type 2 diabetes mellitus with other specified complication: Secondary | ICD-10-CM | POA: Diagnosis not present

## 2024-01-03 DIAGNOSIS — G4733 Obstructive sleep apnea (adult) (pediatric): Secondary | ICD-10-CM | POA: Diagnosis not present

## 2024-01-13 NOTE — Progress Notes (Unsigned)
 No chief complaint on file.  History of Present Illness: 78 yo Anderson with history of skin cancer, obesity, prior CVA, hyperlipidemia, HTN, sleep apnea and severe aortic stenosis s/p TAVR in 2020 who is here today for follow up. He has been followed in our office by Dr. Hobart. He was admitted to [facility] September 2019 with a stroke. TEE in September 2019 with moderate aortic stenosis (mean gradient 36 mmHg, peak velocity 3.7 m/sec) and normal LV systolic function. He underwent TAVR on 11/03/18 with placement of a 26 mm Edwards Sapien 3 THV from the transfemoral approach. Cardiac cath in 2020 with mild non-obstructive disease. Echo July 2025 with LVEF=65-70% with normally functioning AVR with mild PVL.   He is here today for follow up. The patient denies any chest pain, dyspnea, palpitations, lower extremity edema, orthopnea, PND, dizziness, near syncope or syncope.   Primary Care Physician: Montey Lot, PA-C Primary Cardiology: Leim Maranda COME Referring Cardiology: Maranda  Past Medical History:  Diagnosis Date   Aortic stenosis, severe    Arthritis    hands (02/04/2018)   Basal cell carcinoma    MOHS surgery on sides of my nose (02/04/2018)   Chronic lower back pain    CVA (cerebral vascular accident) (HCC) 01/29/2018   CVA (cerebral vascular accident) (HCC) 02/01/2018   Diarrhea    Difficulty urinating    Heart murmur    no problems   High cholesterol    Hypertension    OSA on CPAP    just got CPAP today (02/04/2018)   Rectal bleeding    Rectal pain    S/P TAVR (transcatheter aortic valve replacement) 11/03/2018   26 mm Edwards Sapien 3 transcatheter heart valve placed via percutaneous right transfemoral approach    Stroke [facility] Anderson)    we were told that MRI showed previous stroke we were not aware that he had (02/04/2018)    Past Surgical History:  Procedure Laterality Date   ANTERIOR CERVICAL DECOMP/DISCECTOMY FUSION  2009   BACK SURGERY  1980's   COLONOSCOPY     x  2   EXCISIONAL HEMORRHOIDECTOMY  1989 -2011 X 2 or 3   LAPAROSCOPIC CHOLECYSTECTOMY  2005   LUMBAR DISC SURGERY  1980s X 2   MOHS SURGERY Bilateral 2006   sides of my nose x 2   RIGHT/LEFT HEART CATH AND CORONARY ANGIOGRAPHY N/A 10/21/2018   Procedure: RIGHT/LEFT HEART CATH AND CORONARY ANGIOGRAPHY;  Surgeon: Verlin Lonni BIRCH, MD;  Location: [facility] INVASIVE CV LAB;  Service: Cardiovascular;  Laterality: N/A;   TEE WITHOUT CARDIOVERSION N/A 02/03/2018   Procedure: TRANSESOPHAGEAL ECHOCARDIOGRAM (TEE);  Surgeon: Pietro Redell RAMAN, MD;  Location: [facility] ENDOSCOPY;  Service: Cardiovascular;  Laterality: N/A;   TEE WITHOUT CARDIOVERSION N/A 11/03/2018   Procedure: TRANSESOPHAGEAL ECHOCARDIOGRAM (TEE);  Surgeon: Verlin Lonni BIRCH, MD;  Location: [facility] INVASIVE CV LAB;  Service: Open Heart Surgery;  Laterality: N/A;   TRANSCATHETER AORTIC VALVE REPLACEMENT, TRANSFEMORAL N/A 11/03/2018   Procedure: TRANSCATHETER AORTIC VALVE REPLACEMENT, TRANSFEMORAL;  Surgeon: Verlin Lonni BIRCH, MD;  Location: [facility] INVASIVE CV LAB;  Service: Open Heart Surgery;  Laterality: N/A;   WISDOM TOOTH EXTRACTION      Current Outpatient Medications  Medication Sig Dispense Refill   acetaminophen  (TYLENOL ) 650 MG CR tablet Take 650 mg by mouth every 8 (eight) hours as needed.     amLODipine  (NORVASC ) 5 MG tablet TAKE 1 TABLET (5 MG TOTAL) BY MOUTH DAILY. 90 tablet 2   ammonium lactate  (AMLACTIN) 12 % cream Apply  1 Application topically as needed for dry skin. 385 g 0   amoxicillin  (AMOXIL ) 500 MG tablet Take 4 tablets (2,000 mg total) by mouth one hour prior to all dental visits. 8 tablet 2   aspirin  EC 81 MG EC tablet Take 1 tablet (81 mg total) by mouth daily. 30 tablet 3   carvedilol  (COREG ) 3.125 MG tablet Take 1 tablet (3.125 mg total) by mouth 2 (two) times daily. 180 tablet 3   dutasteride  (AVODART ) 0.5 MG capsule Take 0.5 mg by mouth daily.  3   FARXIGA 10 MG TABS tablet Take 10 mg by mouth daily.      gabapentin (NEURONTIN) 100 MG capsule Take 100 mg by mouth at bedtime.     hydrochlorothiazide  (HYDRODIURIL ) 25 MG tablet Take 0.5 tablets (12.5 mg total) by mouth daily. 45 tablet 2   losartan  (COZAAR ) 100 MG tablet Take 1 tablet (100 mg total) by mouth daily. 90 tablet 3   Metformin HCl 500 MG/5ML SOLN Take 500 mg by mouth at bedtime.     rosuvastatin  (CRESTOR ) 20 MG tablet Take 1 tablet (20 mg total) by mouth daily. 90 tablet 3   spironolactone  (ALDACTONE ) 25 MG tablet Take 1 tablet (25 mg total) by mouth daily. 90 tablet 3   tamsulosin (FLOMAX) 0.4 MG CAPS capsule Take 0.4 mg by mouth daily.     No current facility-administered medications for this visit.    Allergies  Allergen Reactions   Lisinopril Cough   Sulfa Antibiotics Nausea And Vomiting   Atorvastatin  Other (See Comments)    Pt reports causes muscle aches, constipation, and GI issues.     Finasteride  Rash    Social History   Socioeconomic History   Marital status: Married    Spouse name: Not on file   Number of children: Not on file   Years of education: Not on file   Highest education level: Not on file  Occupational History   Occupation: retired  Tobacco Use   Smoking status: Never   Smokeless tobacco: Never  Vaping Use   Vaping status: Never Used  Substance and Sexual Activity   Alcohol use: Not Currently    Alcohol/week: 0.0 standard drinks of alcohol   Drug use: Not Currently   Sexual activity: Not Currently  Other Topics Concern   Not on file  Social History Narrative   Lives with his wife in [place], [place]   Social Drivers of Health   Financial Resource Strain: Not on file  Food Insecurity: Not on file  Transportation Needs: Not on file  Physical Activity: Not on file  Stress: Not on file  Social Connections: Not on file  Intimate Partner Violence: Not on file    Family History  Problem Relation Age of Onset   Cancer Mother        breast/colon   Heart disease Father    Stroke Neg Hx      Review of Systems:  As stated in the HPI and otherwise negative.   There were no vitals taken for this visit.  Physical Examination: General: Well developed, well nourished, NAD  HEENT: OP clear, mucus membranes moist  SKIN: warm, dry. No rashes. Neuro: No focal deficits  Musculoskeletal: Muscle strength 5/5 all ext  Psychiatric: Mood and affect normal  Neck: No JVD, no carotid bruits, no thyromegaly, no lymphadenopathy.  Lungs:Clear bilaterally, no wheezes, rhonci, crackles Cardiovascular: Regular rate and rhythm. No murmurs, gallops or rubs. Abdomen:Soft. Bowel sounds present. Non-tender.  Extremities: No lower  extremity edema. Pulses are 2 + in the bilateral DP/PT.  EKG:  EKG is *** ordered today. The ekg ordered today demonstrates   Echo   Recent Labs: No results found for requested labs within last 365 days.    Wt Readings from Last 3 Encounters:  07/09/23 247 lb (112 kg)  12/31/22 244 lb 12.8 oz (111 kg)  06/20/22 253 lb 12.8 oz (115.1 kg)    Assessment and Plan:   1. Severe aortic stenosis s/p TAVR: AVR working well by echo July 2025 with mild PVL. Continue ASA. Continue SBE as indicated.   2. CAD without angina: Mild CAD by cath in 2020. No chest pain. Continue ASA and statin.   3. HTN: BP is well controlled.   4. HLD: LDL ***. Continue statin  Labs/ tests ordered today include:  No orders of the defined types were placed in this encounter.  Disposition:   FU with the me in one year  Signed, Lonni Cash, MD 01/13/2024 4:05 PM    [facility] [address] [place], [place], [place]  [address] Phone: [phone]; Fax: [phone]

## 2024-01-14 ENCOUNTER — Encounter: Payer: Self-pay | Admitting: Cardiovascular Disease

## 2024-01-14 ENCOUNTER — Ambulatory Visit: Attending: Cardiovascular Disease | Admitting: Cardiovascular Disease

## 2024-01-14 VITALS — BP 110/50 | HR 65 | Ht 70.0 in | Wt 249.2 lb

## 2024-01-14 DIAGNOSIS — Z952 Presence of prosthetic heart valve: Secondary | ICD-10-CM

## 2024-01-14 DIAGNOSIS — E782 Mixed hyperlipidemia: Secondary | ICD-10-CM

## 2024-01-14 DIAGNOSIS — I10 Essential (primary) hypertension: Secondary | ICD-10-CM | POA: Diagnosis not present

## 2024-01-14 DIAGNOSIS — I2510 Atherosclerotic heart disease of native coronary artery without angina pectoris: Secondary | ICD-10-CM

## 2024-01-14 NOTE — Patient Instructions (Signed)
 Medication Instructions:  Your physician recommends that you continue on your current medications as directed. Please refer to the Current Medication list given to you today.  *If you need a refill on your cardiac medications before your next appointment, please call your pharmacy*  Lab Work: None.  If you have labs (blood work) drawn today and your tests are completely normal, you will receive your results only by: MyChart Message (if you have MyChart) OR A paper copy in the mail If you have any lab test that is abnormal or we need to change your treatment, we will call you to review the results.  Testing/Procedures: None.  Follow-Up: At [facility] you and your health needs are our priority.  As part of our continuing mission to provide you with exceptional heart care, our providers are all part of one team.  This team includes your primary Cardiologist (physician) and Advanced Practice Providers or APPs (Physician Assistants and Nurse Practitioners) who all work together to provide you with the care you need, when you need it.  Your next appointment:   1 year(s)  Provider:   Lonni Cash, MD

## 2024-01-29 ENCOUNTER — Other Ambulatory Visit: Payer: Self-pay | Admitting: Cardiology

## 2024-01-29 DIAGNOSIS — E782 Mixed hyperlipidemia: Secondary | ICD-10-CM

## 2024-01-29 DIAGNOSIS — Z952 Presence of prosthetic heart valve: Secondary | ICD-10-CM

## 2024-01-29 DIAGNOSIS — I2510 Atherosclerotic heart disease of native coronary artery without angina pectoris: Secondary | ICD-10-CM

## 2024-01-29 DIAGNOSIS — E785 Hyperlipidemia, unspecified: Secondary | ICD-10-CM

## 2024-01-29 DIAGNOSIS — I10 Essential (primary) hypertension: Secondary | ICD-10-CM

## 2024-02-02 ENCOUNTER — Other Ambulatory Visit: Payer: Self-pay | Admitting: Cardiology

## 2024-02-02 DIAGNOSIS — I2510 Atherosclerotic heart disease of native coronary artery without angina pectoris: Secondary | ICD-10-CM

## 2024-02-02 DIAGNOSIS — E785 Hyperlipidemia, unspecified: Secondary | ICD-10-CM

## 2024-02-02 DIAGNOSIS — I10 Essential (primary) hypertension: Secondary | ICD-10-CM

## 2024-02-02 DIAGNOSIS — Z952 Presence of prosthetic heart valve: Secondary | ICD-10-CM

## 2024-02-02 DIAGNOSIS — E782 Mixed hyperlipidemia: Secondary | ICD-10-CM

## 2024-03-09 ENCOUNTER — Other Ambulatory Visit: Payer: Self-pay | Admitting: Podiatry

## 2024-04-12 DIAGNOSIS — K6289 Other specified diseases of anus and rectum: Secondary | ICD-10-CM | POA: Diagnosis not present

## 2024-04-12 DIAGNOSIS — Z1331 Encounter for screening for depression: Secondary | ICD-10-CM | POA: Diagnosis not present

## 2024-04-12 DIAGNOSIS — I10 Essential (primary) hypertension: Secondary | ICD-10-CM | POA: Diagnosis not present

## 2024-04-12 DIAGNOSIS — Z9181 History of falling: Secondary | ICD-10-CM | POA: Diagnosis not present

## 2024-04-12 DIAGNOSIS — I2510 Atherosclerotic heart disease of native coronary artery without angina pectoris: Secondary | ICD-10-CM | POA: Diagnosis not present

## 2024-04-12 DIAGNOSIS — I69320 Aphasia following cerebral infarction: Secondary | ICD-10-CM | POA: Diagnosis not present

## 2024-04-12 DIAGNOSIS — E1142 Type 2 diabetes mellitus with diabetic polyneuropathy: Secondary | ICD-10-CM | POA: Diagnosis not present

## 2024-04-12 DIAGNOSIS — Z23 Encounter for immunization: Secondary | ICD-10-CM | POA: Diagnosis not present

## 2024-04-12 DIAGNOSIS — E1169 Type 2 diabetes mellitus with other specified complication: Secondary | ICD-10-CM | POA: Diagnosis not present

## 2024-04-12 DIAGNOSIS — N400 Enlarged prostate without lower urinary tract symptoms: Secondary | ICD-10-CM | POA: Diagnosis not present
